# Patient Record
Sex: Female | Born: 1950
Health system: Southern US, Community
[De-identification: ages and names within clinical notes are randomized; demographics above are authoritative.]

## PROBLEM LIST (undated history)

## (undated) DIAGNOSIS — F329 Major depressive disorder, single episode, unspecified: Secondary | ICD-10-CM

## (undated) DIAGNOSIS — E119 Type 2 diabetes mellitus without complications: Secondary | ICD-10-CM

## (undated) DIAGNOSIS — I10 Essential (primary) hypertension: Secondary | ICD-10-CM

## (undated) DIAGNOSIS — J449 Chronic obstructive pulmonary disease, unspecified: Secondary | ICD-10-CM

## (undated) DIAGNOSIS — J45909 Unspecified asthma, uncomplicated: Secondary | ICD-10-CM

## (undated) DIAGNOSIS — M4800 Spinal stenosis, site unspecified: Secondary | ICD-10-CM

## (undated) DIAGNOSIS — E079 Disorder of thyroid, unspecified: Secondary | ICD-10-CM

## (undated) DIAGNOSIS — C801 Malignant (primary) neoplasm, unspecified: Secondary | ICD-10-CM

## (undated) DIAGNOSIS — G4700 Insomnia, unspecified: Secondary | ICD-10-CM

## (undated) DIAGNOSIS — E785 Hyperlipidemia, unspecified: Secondary | ICD-10-CM

## (undated) DIAGNOSIS — F32A Depression, unspecified: Secondary | ICD-10-CM

## (undated) DIAGNOSIS — F1910 Other psychoactive substance abuse, uncomplicated: Secondary | ICD-10-CM

## (undated) HISTORY — DX: Malignant (primary) neoplasm, unspecified: C80.1

## (undated) HISTORY — DX: Chronic obstructive pulmonary disease, unspecified: J44.9

## (undated) HISTORY — DX: Essential (primary) hypertension: I10

## (undated) HISTORY — DX: Disorder of thyroid, unspecified: E07.9

## (undated) HISTORY — DX: Insomnia, unspecified: G47.00

## (undated) HISTORY — PX: CATARACT EXTRACTION: SUR2

## (undated) HISTORY — DX: Depression, unspecified: F32.A

## (undated) HISTORY — DX: Unspecified asthma, uncomplicated: J45.909

## (undated) HISTORY — DX: Type 2 diabetes mellitus without complications: E11.9

## (undated) HISTORY — DX: Spinal stenosis, site unspecified: M48.00

## (undated) HISTORY — DX: Hyperlipidemia, unspecified: E78.5

## (undated) HISTORY — PX: ABDOMINAL HYSTERECTOMY: SHX81

---
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
## 2004-01-01 ENCOUNTER — Ambulatory Visit: Payer: Self-pay | Admitting: Anesthesiology

## 2004-01-28 ENCOUNTER — Ambulatory Visit: Payer: Self-pay | Admitting: Anesthesiology

## 2004-02-25 ENCOUNTER — Ambulatory Visit: Payer: Self-pay | Admitting: Anesthesiology

## 2004-03-17 ENCOUNTER — Ambulatory Visit: Payer: Self-pay | Admitting: Anesthesiology

## 2004-04-20 ENCOUNTER — Ambulatory Visit: Payer: Self-pay | Admitting: Anesthesiology

## 2004-05-19 ENCOUNTER — Ambulatory Visit: Payer: Self-pay | Admitting: Anesthesiology

## 2004-06-10 ENCOUNTER — Ambulatory Visit: Payer: Self-pay | Admitting: Anesthesiology

## 2004-07-13 ENCOUNTER — Ambulatory Visit: Payer: Self-pay | Admitting: Anesthesiology

## 2004-08-13 ENCOUNTER — Ambulatory Visit: Payer: Self-pay | Admitting: Anesthesiology

## 2004-09-14 ENCOUNTER — Ambulatory Visit: Payer: Self-pay | Admitting: Anesthesiology

## 2004-10-08 ENCOUNTER — Ambulatory Visit: Payer: Self-pay | Admitting: Internal Medicine

## 2004-10-13 ENCOUNTER — Ambulatory Visit: Payer: Self-pay | Admitting: Internal Medicine

## 2004-10-27 ENCOUNTER — Ambulatory Visit: Payer: Self-pay | Admitting: Internal Medicine

## 2004-12-01 ENCOUNTER — Ambulatory Visit: Payer: Self-pay | Admitting: Internal Medicine

## 2004-12-06 ENCOUNTER — Ambulatory Visit: Payer: Self-pay | Admitting: Oncology

## 2005-01-04 ENCOUNTER — Ambulatory Visit: Payer: Self-pay | Admitting: Internal Medicine

## 2005-02-04 ENCOUNTER — Ambulatory Visit: Payer: Self-pay | Admitting: Internal Medicine

## 2005-02-04 ENCOUNTER — Ambulatory Visit: Payer: Self-pay | Admitting: Oncology

## 2005-03-25 ENCOUNTER — Ambulatory Visit: Payer: Self-pay | Admitting: Oncology

## 2005-04-01 ENCOUNTER — Ambulatory Visit: Payer: Self-pay

## 2005-07-25 ENCOUNTER — Ambulatory Visit: Payer: Self-pay | Admitting: Oncology

## 2007-08-18 ENCOUNTER — Emergency Department: Payer: Self-pay | Admitting: Orthopaedic Surgery

## 2010-11-10 ENCOUNTER — Emergency Department: Payer: Self-pay | Admitting: *Deleted

## 2012-02-08 ENCOUNTER — Emergency Department: Payer: Self-pay | Admitting: Emergency Medicine

## 2012-02-08 LAB — CK TOTAL AND CKMB (NOT AT ARMC)
CK, Total: 64 U/L (ref 21–215)
CK-MB: 1.5 ng/mL (ref 0.5–3.6)

## 2012-02-08 LAB — CBC
MCH: 32.2 pg (ref 26.0–34.0)
MCHC: 34.3 g/dL (ref 32.0–36.0)
MCV: 94 fL (ref 80–100)
Platelet: 286 10*3/uL (ref 150–440)
RBC: 4.63 10*6/uL (ref 3.80–5.20)
WBC: 11 10*3/uL (ref 3.6–11.0)

## 2012-02-08 LAB — BASIC METABOLIC PANEL
Anion Gap: 9 (ref 7–16)
BUN: 10 mg/dL (ref 7–18)
Chloride: 102 mmol/L (ref 98–107)
Co2: 27 mmol/L (ref 21–32)
Glucose: 111 mg/dL — ABNORMAL HIGH (ref 65–99)
Osmolality: 275 (ref 275–301)

## 2012-02-08 LAB — TROPONIN I: Troponin-I: <0.02 ng/mL

## 2013-09-24 DIAGNOSIS — Z720 Tobacco use: Secondary | ICD-10-CM

## 2013-09-24 DIAGNOSIS — F17219 Nicotine dependence, cigarettes, with unspecified nicotine-induced disorders: Secondary | ICD-10-CM | POA: Insufficient documentation

## 2013-10-07 ENCOUNTER — Ambulatory Visit: Payer: Self-pay | Admitting: Urology

## 2014-03-10 ENCOUNTER — Ambulatory Visit: Payer: Self-pay | Admitting: Nurse Practitioner

## 2014-03-26 ENCOUNTER — Ambulatory Visit: Payer: Self-pay

## 2014-08-10 IMAGING — CR DG CHEST 2V
1 series · 2 of 2 positions shown · non-contrast
Comparison: PA and lateral chest of February 08, 2012

CLINICAL DATA: COPD with wheezing.

EXAM:
CHEST  2 VIEW

[Series 1: pa · 0.17mm/px · 2 of 2 slices shown]
[im 1/2]
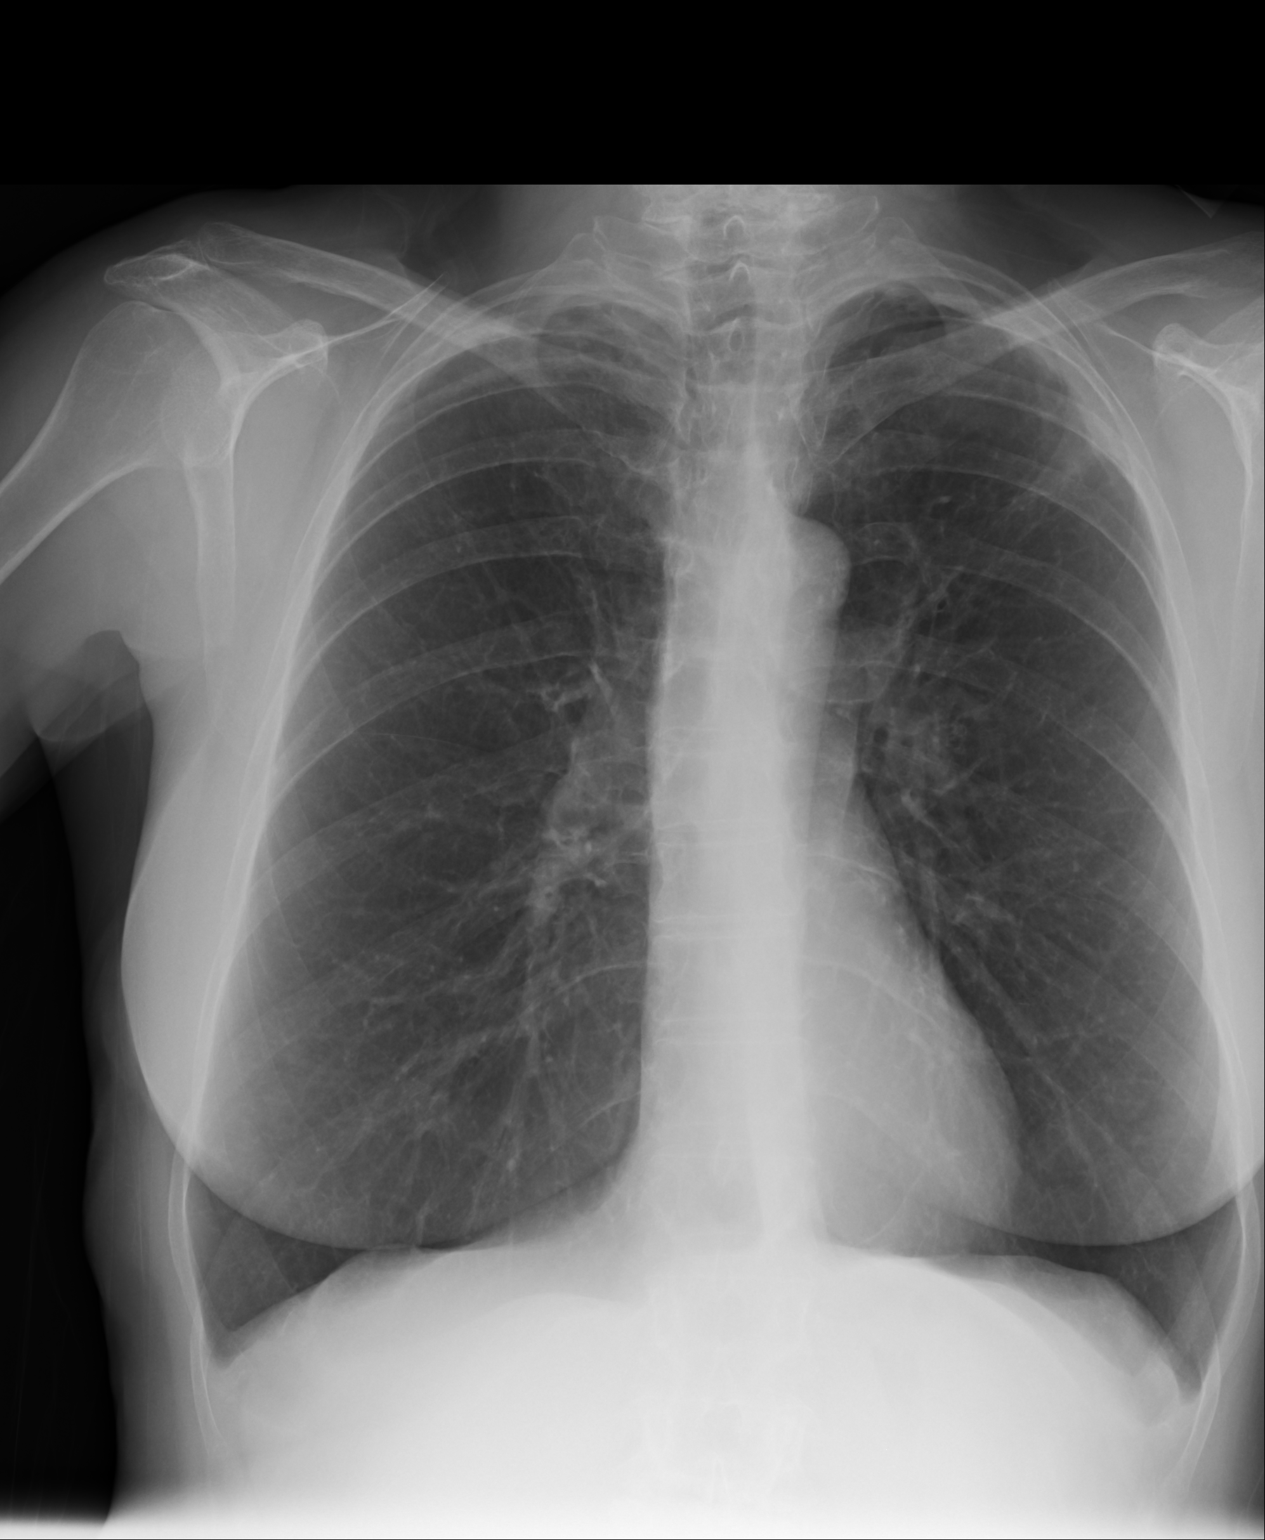
[im 2/2]
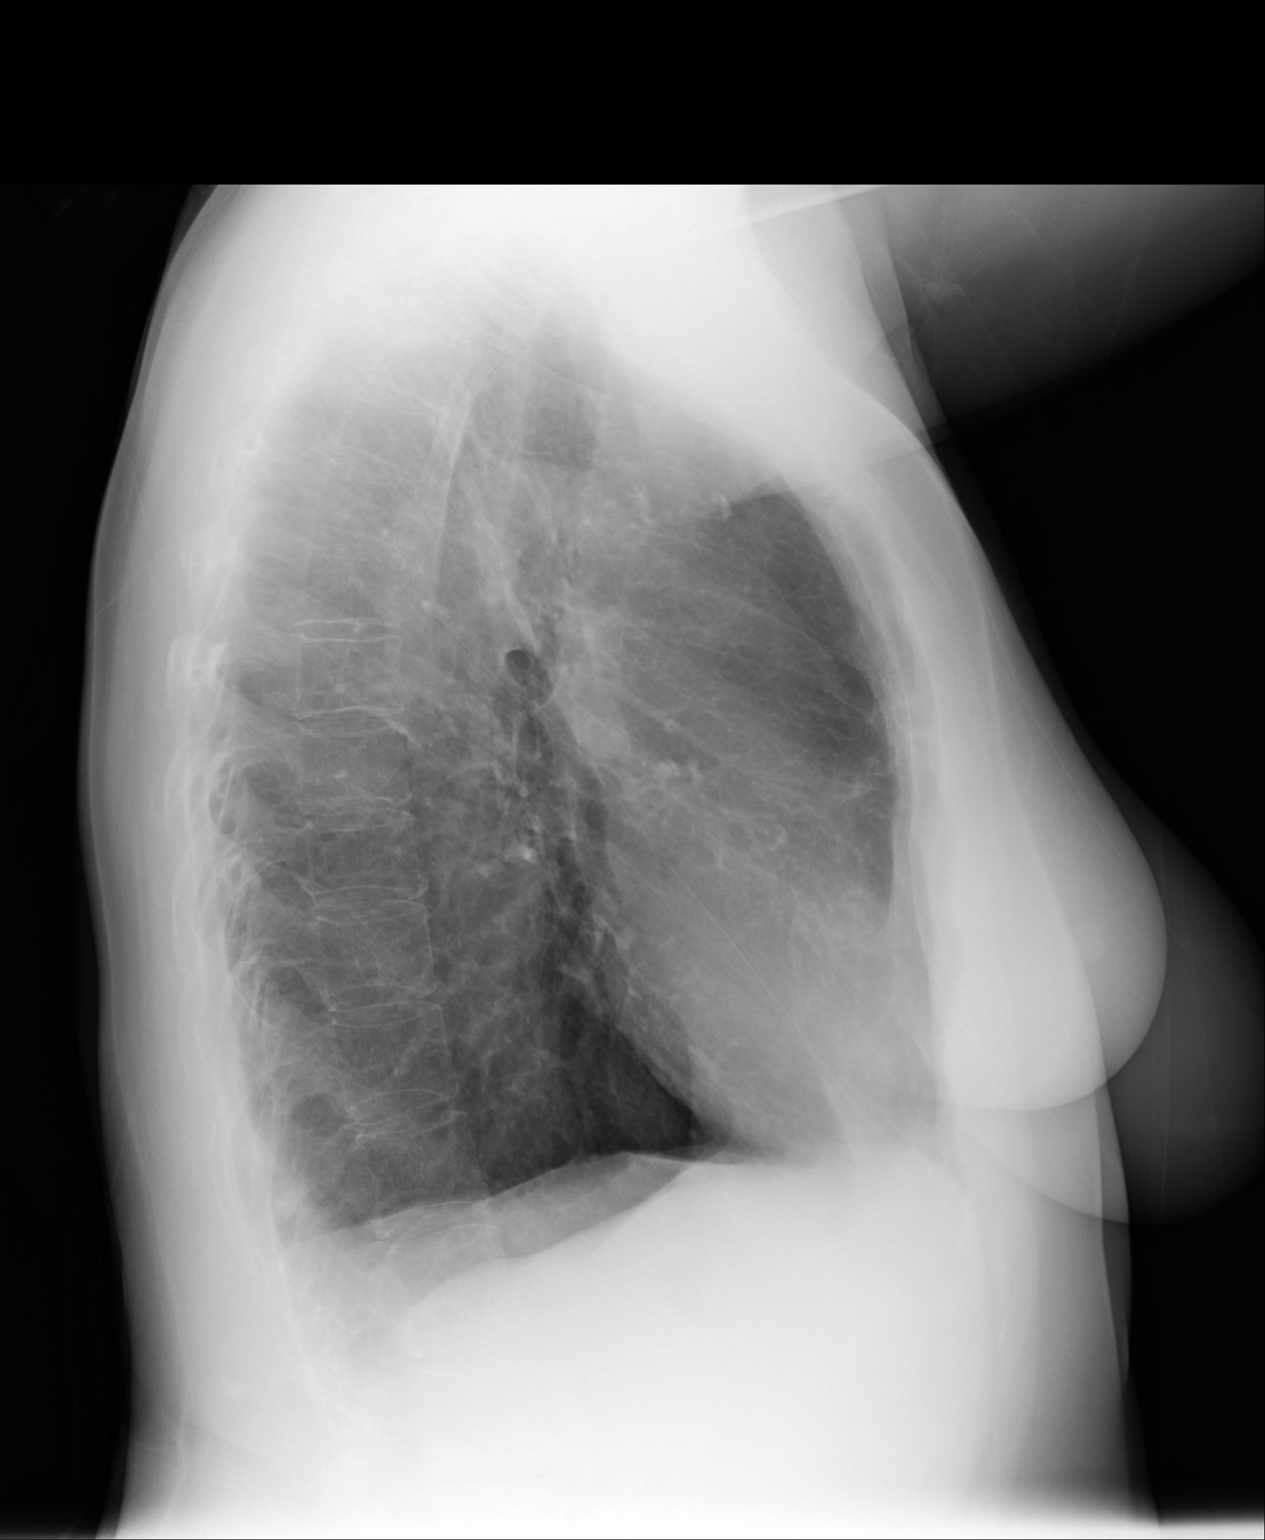

[2 of 2 positions shown; findings below may reference images not displayed]

FINDINGS: The lungs are hyperinflated. There is stable apical pleural
thickening bilaterally greater on the left than on the right. The
heart and pulmonary vascularity are normal. There is no pleural
effusion or pneumothorax. The bony thorax is unremarkable.
IMPRESSION: COPD. There is no pneumonia nor other acute cardiopulmonary
abnormality.

## 2014-08-20 ENCOUNTER — Ambulatory Visit: Payer: Self-pay | Admitting: Internal Medicine

## 2014-08-20 DIAGNOSIS — I10 Essential (primary) hypertension: Secondary | ICD-10-CM | POA: Insufficient documentation

## 2014-11-13 ENCOUNTER — Other Ambulatory Visit: Payer: Self-pay

## 2014-11-18 ENCOUNTER — Other Ambulatory Visit: Payer: Self-pay

## 2014-11-20 ENCOUNTER — Ambulatory Visit: Payer: Self-pay

## 2014-11-20 DIAGNOSIS — M4800 Spinal stenosis, site unspecified: Secondary | ICD-10-CM | POA: Insufficient documentation

## 2014-12-30 ENCOUNTER — Ambulatory Visit: Payer: Self-pay

## 2014-12-30 DIAGNOSIS — G4700 Insomnia, unspecified: Secondary | ICD-10-CM | POA: Insufficient documentation

## 2014-12-30 DIAGNOSIS — F1010 Alcohol abuse, uncomplicated: Secondary | ICD-10-CM | POA: Insufficient documentation

## 2015-02-17 ENCOUNTER — Other Ambulatory Visit: Payer: Self-pay

## 2015-02-17 LAB — BASIC METABOLIC PANEL
BUN: 23 mg/dL — AB (ref 4–21)
CREATININE: 1.2 mg/dL — AB (ref 0.5–1.1)
Glucose: 114 mg/dL
POTASSIUM: 5 mmol/L (ref 3.4–5.3)
Sodium: 135 mmol/L — AB (ref 137–147)

## 2015-02-17 LAB — CBC AND DIFFERENTIAL
HCT: 38 % (ref 36–46)
Hemoglobin: 12.7 g/dL (ref 12.0–16.0)
Neutrophils Absolute: 4 /uL
PLATELETS: 308 10*3/uL (ref 150–399)
WBC: 8.5 10^3/mL

## 2015-02-17 LAB — HEMOGLOBIN A1C: Hemoglobin A1C: 5.8

## 2015-02-26 ENCOUNTER — Ambulatory Visit: Payer: Self-pay

## 2015-03-05 ENCOUNTER — Ambulatory Visit: Payer: Self-pay

## 2015-03-12 ENCOUNTER — Ambulatory Visit: Payer: Self-pay

## 2015-04-02 ENCOUNTER — Other Ambulatory Visit: Payer: Self-pay

## 2015-04-02 LAB — LIPID PANEL
Cholesterol: 165 mg/dL (ref 0–200)
HDL: 79 mg/dL — AB (ref 35–70)
LDL Cholesterol: 50 mg/dL
TRIGLYCERIDES: 182 mg/dL — AB (ref 40–160)

## 2015-04-03 LAB — HEPATIC FUNCTION PANEL
ALK PHOS: 125 U/L (ref 25–125)
ALT: 14 U/L (ref 7–35)
AST: 26 U/L (ref 13–35)
BILIRUBIN DIRECT: 0.12 mg/dL (ref 0.01–0.4)
BILIRUBIN, TOTAL: 0.3 mg/dL

## 2015-04-09 ENCOUNTER — Ambulatory Visit: Payer: Self-pay

## 2015-04-09 DIAGNOSIS — E785 Hyperlipidemia, unspecified: Secondary | ICD-10-CM | POA: Insufficient documentation

## 2015-04-09 DIAGNOSIS — J440 Chronic obstructive pulmonary disease with acute lower respiratory infection: Secondary | ICD-10-CM

## 2015-04-09 DIAGNOSIS — J209 Acute bronchitis, unspecified: Secondary | ICD-10-CM | POA: Insufficient documentation

## 2015-06-26 ENCOUNTER — Other Ambulatory Visit: Payer: Self-pay | Admitting: Nurse Practitioner

## 2015-06-26 ENCOUNTER — Other Ambulatory Visit: Payer: Self-pay | Admitting: Urology

## 2015-07-02 DIAGNOSIS — F1010 Alcohol abuse, uncomplicated: Secondary | ICD-10-CM

## 2015-07-02 DIAGNOSIS — J449 Chronic obstructive pulmonary disease, unspecified: Secondary | ICD-10-CM

## 2015-07-02 DIAGNOSIS — J45909 Unspecified asthma, uncomplicated: Secondary | ICD-10-CM

## 2015-07-02 DIAGNOSIS — E785 Hyperlipidemia, unspecified: Secondary | ICD-10-CM

## 2015-07-02 DIAGNOSIS — I10 Essential (primary) hypertension: Secondary | ICD-10-CM

## 2015-07-02 DIAGNOSIS — M4800 Spinal stenosis, site unspecified: Secondary | ICD-10-CM

## 2015-07-02 DIAGNOSIS — G4700 Insomnia, unspecified: Secondary | ICD-10-CM

## 2015-07-02 DIAGNOSIS — Z720 Tobacco use: Secondary | ICD-10-CM

## 2015-07-29 ENCOUNTER — Other Ambulatory Visit: Payer: Self-pay | Admitting: Urology

## 2015-08-04 ENCOUNTER — Ambulatory Visit: Payer: Self-pay | Admitting: Family Medicine

## 2015-08-04 VITALS — BP 121/76 | HR 114 | Wt 148.0 lb

## 2015-08-04 DIAGNOSIS — J449 Chronic obstructive pulmonary disease, unspecified: Secondary | ICD-10-CM

## 2015-08-04 DIAGNOSIS — M4800 Spinal stenosis, site unspecified: Secondary | ICD-10-CM

## 2015-08-04 DIAGNOSIS — J40 Bronchitis, not specified as acute or chronic: Secondary | ICD-10-CM

## 2015-08-04 MED ORDER — GABAPENTIN 300 MG PO CAPS: ORAL_CAPSULE | ORAL | Status: DC

## 2015-08-04 MED ORDER — GUAIFENESIN 100 MG/5ML PO SYRP: 100.0000 mg | ORAL_SOLUTION | Freq: Three times a day (TID) | ORAL | Status: DC | PRN

## 2015-08-04 MED ORDER — AZITHROMYCIN 250 MG PO TABS: ORAL_TABLET | ORAL | Status: AC

## 2015-08-04 NOTE — Progress Notes (Signed)
Name: Kristen Mills   MRN: [id number]    DOB: 1950-12-04   Date:08/04/2015       Progress Note  Subjective  Chief Complaint  Chief Complaint  Patient presents with  . Cough  . COPD  . Medication Refill    gabapentin refill    HPI  Complain of coughj, head congestion, chest tightness for the last week. Cough is non-productive. Feels like she is in a bucket. Having left ear pain.    Also has spinal stenosis and takes gabapentin three times a day which helps.   Lab Results  Component Value Date   CREATININE 1.2* 02/17/2015   BUN 23* 02/17/2015   NA 135* 02/17/2015   K 5.0 02/17/2015   CL 102 02/08/2012   CO2 27 02/08/2012   Lab Results  Component Value Date   CHOL 165 04/02/2015   HDL 79* 04/02/2015   LDLCALC 50 04/02/2015   TRIG 182* 04/02/2015   No problem-specific assessment & plan notes found for this encounter.   Past Medical History  Diagnosis Date  . Asthma   . COPD (chronic obstructive pulmonary disease) ([place])   . Hypertension   . Cancer [facility])     Uterine Cancer    Social History  Substance Use Topics  . Smoking status: Current Every Day Smoker -- 1.00 packs/day    Types: Cigarettes  . Smokeless tobacco: Not on file  . Alcohol Use: 4.2 oz/week    7 Cans of beer per week     Comment: Few beers daily.     Current outpatient prescriptions:  .  albuterol (VENTOLIN HFA) 108 (90 Base) MCG/ACT inhaler, Inhale 2 puffs into the lungs every 6 (six) hours as needed for wheezing or shortness of breath., Disp: , Rfl:  .  cetirizine (ZYRTEC) 10 MG tablet, Take 10 mg by mouth daily., Disp: , Rfl:  .  fluticasone (VERAMYST) 27.5 MCG/SPRAY nasal spray, Place 2 sprays into the nose daily., Disp: , Rfl:  .  Fluticasone-Salmeterol (ADVAIR DISKUS) 250-50 MCG/DOSE AEPB, Inhale 2 puffs into the lungs every 6 (six) hours as needed., Disp: , Rfl:  .  gabapentin (NEURONTIN) 300 MG capsule, TAKE 3 CAPSULES BY MOUTH 3 TIMES A DAY, Disp: 270 capsule, Rfl: 0 .  gemfibrozil  (LOPID) 600 MG tablet, Take 600 mg by mouth 2 (two) times daily before a meal., Disp: , Rfl:  .  lisinopril (PRINIVIL,ZESTRIL) 10 MG tablet, TAKE ONE TABLET BY MOUTH EVERY DAY., Disp: 90 tablet, Rfl: 0 .  nortriptyline (PAMELOR) 25 MG capsule, Take 25 mg by mouth at bedtime., Disp: , Rfl:  .  lisinopril (PRINIVIL,ZESTRIL) 10 MG tablet, TAKE ONE TABLET BY MOUTH EVERY DAY., Disp: 30 tablet, Rfl: 0  Allergies no known allergies  ROS  See HPI  Objective  Filed Vitals:   08/04/15 1745  BP: 121/76  Pulse: 114  Weight: 148 lb (67.132 kg)     Physical Exam  General Appearance:    Alert, cooperative, no distress  HENT:   bilateral TM normal without fluid or infection, neck without nodes, throat normal without erythema or exudate and frontal  sinus tender  Eyes:    PERRL, conjunctiva/corneas clear, EOM's intact       Lungs:     Occasional expiratory wheeze, no rales,  respirations unlabored  Heart:    Regular rate and rhythm  Neurologic:   Awake, alert, oriented x 3. No apparent focal neurological           defect.  No results found for this or any previous visit (from the past 2160 hour(s)).   Assessment & Plan  1. Chronic obstructive pulmonary disease, unspecified COPD type ([place]) Continue current inhalers  2. Spinal stenosis, unspecified spinal region Refill gabapentin - gabapentin (NEURONTIN) 300 MG capsule; TAKE 3 CAPSULES BY MOUTH 3 TIMES A DAY. Please place prescription on hold until patient calls for refill  Dispense: 270 capsule; Refill: 3  3. Bronchitis  - azithromycin (ZITHROMAX) 250 MG tablet; 2 by mouth today, then 1 daily for 4 days  Dispense: 6 tablet; Refill: 0 - guaifenesin (ROBITUSSIN) 100 MG/5ML syrup; Take 5 mLs (100 mg total) by mouth 3 (three) times daily as needed for cough.  Dispense: 120 mL; Refill: 0

## 2015-08-11 ENCOUNTER — Telehealth: Payer: Self-pay

## 2015-08-11 NOTE — Telephone Encounter (Signed)
Patient states that she seen Dr. Caryn Section last Tuesday evening and he gave her an RX for a ZPak for sinusitis. Patient does not feel the infection is resolved and would like another round of the antibiotic.

## 2015-08-11 NOTE — Telephone Encounter (Signed)
Patient will need Augmentin 875/125 mg, 1 tablet twice daily, #14.

## 2015-10-08 ENCOUNTER — Ambulatory Visit: Payer: Self-pay

## 2015-10-20 ENCOUNTER — Ambulatory Visit: Payer: Self-pay | Admitting: Urology

## 2015-10-20 DIAGNOSIS — M4800 Spinal stenosis, site unspecified: Secondary | ICD-10-CM

## 2015-10-20 MED ORDER — LISINOPRIL 10 MG PO TABS: 10.0000 mg | ORAL_TABLET | Freq: Every day | ORAL | 0 refills | Status: DC

## 2015-10-20 MED ORDER — GABAPENTIN 300 MG PO CAPS: ORAL_CAPSULE | ORAL | 3 refills | Status: DC

## 2015-10-20 MED ORDER — GEMFIBROZIL 600 MG PO TABS: 600.0000 mg | ORAL_TABLET | Freq: Two times a day (BID) | ORAL | 0 refills | Status: DC

## 2015-10-20 MED ORDER — FLUTICASONE FUROATE 27.5 MCG/SPRAY NA SUSP: 2.0000 | Freq: Every day | NASAL | 3 refills | Status: DC

## 2015-10-20 NOTE — Progress Notes (Signed)
  Patient: Kristen Mills Female    DOB: February 19, 1951   65 y.o.   MRN: [id number] Visit Date: 10/20/2015  Today's Provider: [place]   Chief Complaint  Patient presents with  . Diabetes    medication refill   Subjective:    HPI Patient with COPD, HLD and HTN presents today for a 6 month follow up.    She has been told she tested positive for Hep C [facility].    Need meds refilled.  Using rescue inhaler three times daily      Allergies no known allergies Previous Medications   ALBUTEROL (VENTOLIN HFA) 108 (90 BASE) MCG/ACT INHALER    Inhale 2 puffs into the lungs every 6 (six) hours as needed for wheezing or shortness of breath.   CETIRIZINE (ZYRTEC) 10 MG TABLET    Take 10 mg by mouth daily.   FLUTICASONE-SALMETEROL (ADVAIR DISKUS) 250-50 MCG/DOSE AEPB    Inhale 2 puffs into the lungs every 6 (six) hours as needed.   GUAIFENESIN (ROBITUSSIN) 100 MG/5ML SYRUP    Take 5 mLs (100 mg total) by mouth 3 (three) times daily as needed for cough.   NORTRIPTYLINE (PAMELOR) 25 MG CAPSULE    Take 25 mg by mouth at bedtime.    Review of Systems  Social History  Substance Use Topics  . Smoking status: Current Every Day Smoker    Packs/day: 1.00    Types: Cigarettes  . Smokeless tobacco: Not on file  . Alcohol use 4.2 oz/week    7 Cans of beer per week     Comment: Few beers daily.   Objective:   BP 105/77   Pulse (!) 107   Wt (P) 149 lb (67.6 kg)   Physical Exam  Heart:  S1, S2, no murmurs Lung: Expiratory wheezes    Assessment & Plan:     1. COPD  -Veramyst refilled  -Using rescue inhaler three times daily  -Using Advair  -Encourage smoking cessation  2. Tobacco addiction  -Not interested in quitting at this time  3. Alcohol abuse  -Encourage cessation of ETOH  4. Hep C  -recheck test today  -RTC pending labs      [facility]

## 2015-10-21 LAB — CBC WITH DIFFERENTIAL/PLATELET
Basophils Absolute: 0.1 10*3/uL (ref 0.0–0.2)
Basos: 1 %
EOS (ABSOLUTE): 0.1 10*3/uL (ref 0.0–0.4)
Eos: 1 %
HEMOGLOBIN: 13.7 g/dL (ref 11.1–15.9)
Hematocrit: 42.5 % (ref 34.0–46.6)
IMMATURE GRANS (ABS): 0 10*3/uL (ref 0.0–0.1)
Immature Granulocytes: 0 %
LYMPHS ABS: 2.7 10*3/uL (ref 0.7–3.1)
LYMPHS: 30 %
MCH: 29.3 pg (ref 26.6–33.0)
MCHC: 32.2 g/dL (ref 31.5–35.7)
MCV: 91 fL (ref 79–97)
MONOCYTES: 10 %
Monocytes Absolute: 0.9 10*3/uL (ref 0.1–0.9)
Neutrophils Absolute: 5.2 10*3/uL (ref 1.4–7.0)
Neutrophils: 58 %
Platelets: 360 10*3/uL (ref 150–379)
RBC: 4.67 x10E6/uL (ref 3.77–5.28)
RDW: 13.8 % (ref 12.3–15.4)
WBC: 9 10*3/uL (ref 3.4–10.8)

## 2015-10-21 LAB — COMPREHENSIVE METABOLIC PANEL
A/G RATIO: 1.4 (ref 1.2–2.2)
ALBUMIN: 4.2 g/dL (ref 3.6–4.8)
ALK PHOS: 77 IU/L (ref 39–117)
ALT: 27 IU/L (ref 0–32)
AST: 31 IU/L (ref 0–40)
BUN/Creatinine Ratio: 19 (ref 12–28)
BUN: 21 mg/dL (ref 8–27)
Bilirubin Total: 0.2 mg/dL (ref 0.0–1.2)
CO2: 23 mmol/L (ref 18–29)
CREATININE: 1.12 mg/dL — AB (ref 0.57–1.00)
Calcium: 9.8 mg/dL (ref 8.7–10.3)
Chloride: 98 mmol/L (ref 96–106)
GFR calc Af Amer: 60 mL/min/{1.73_m2} (ref >59)
GFR, EST NON AFRICAN AMERICAN: 52 mL/min/{1.73_m2} — AB (ref >59)
Globulin, Total: 2.9 g/dL (ref 1.5–4.5)
Glucose: 123 mg/dL — ABNORMAL HIGH (ref 65–99)
Potassium: 4.9 mmol/L (ref 3.5–5.2)
Sodium: 137 mmol/L (ref 134–144)
TOTAL PROTEIN: 7.1 g/dL (ref 6.0–8.5)

## 2015-10-21 LAB — LIPID PANEL
CHOL/HDL RATIO: 2.6 ratio (ref 0.0–4.4)
CHOLESTEROL TOTAL: 187 mg/dL (ref 100–199)
HDL: 72 mg/dL (ref >39)
LDL CALC: 85 mg/dL (ref 0–99)
TRIGLYCERIDES: 149 mg/dL (ref 0–149)
VLDL CHOLESTEROL CAL: 30 mg/dL (ref 5–40)

## 2015-10-21 LAB — TSH: TSH: 1.72 u[IU]/mL (ref 0.450–4.500)

## 2015-10-23 LAB — SPECIMEN STATUS REPORT

## 2015-10-27 LAB — HEPATITIS C ANTIBODY (REFLEX)

## 2015-10-27 LAB — COMMENT2 - HEP PANEL

## 2015-10-27 LAB — SPECIMEN STATUS REPORT

## 2015-11-03 ENCOUNTER — Ambulatory Visit: Payer: Self-pay | Admitting: Family Medicine

## 2015-11-03 DIAGNOSIS — B1920 Unspecified viral hepatitis C without hepatic coma: Secondary | ICD-10-CM

## 2015-11-03 DIAGNOSIS — B182 Chronic viral hepatitis C: Secondary | ICD-10-CM | POA: Insufficient documentation

## 2015-11-03 NOTE — Progress Notes (Signed)
     Primary Care Progress Note  Patient: Kristen Mills Female    DOB: [birth year]   65 y.o.   MRN: [id number] Visit Date: 11/03/2015  Today's Provider: Lelon Huh, MD  No chief complaint on file.  Subjective:    HPI She is here to follow up on labs with positive hepatitis C antibody test. She states she contracted it from old boyfriend. She feels fatigued, but otherwise well.     No Known Allergies Previous Medications   ALBUTEROL (VENTOLIN HFA) 108 (90 BASE) MCG/ACT INHALER    Inhale 2 puffs into the lungs every 6 (six) hours as needed for wheezing or shortness of breath.   CETIRIZINE (ZYRTEC) 10 MG TABLET    Take 10 mg by mouth daily.   FLUTICASONE (VERAMYST) 27.5 MCG/SPRAY NASAL SPRAY    Place 2 sprays into the nose daily.   FLUTICASONE-SALMETEROL (ADVAIR DISKUS) 250-50 MCG/DOSE AEPB    Inhale 2 puffs into the lungs every 6 (six) hours as needed.   GABAPENTIN (NEURONTIN) 300 MG CAPSULE    TAKE 3 CAPSULES BY MOUTH 3 TIMES A DAY. Please place prescription on hold until patient calls for refill   GEMFIBROZIL (LOPID) 600 MG TABLET    Take 1 tablet (600 mg total) by mouth 2 (two) times daily before a meal.   GUAIFENESIN (ROBITUSSIN) 100 MG/5ML SYRUP    Take 5 mLs (100 mg total) by mouth 3 (three) times daily as needed for cough.   LISINOPRIL (PRINIVIL,ZESTRIL) 10 MG TABLET    Take 1 tablet (10 mg total) by mouth daily.   NORTRIPTYLINE (PAMELOR) 25 MG CAPSULE    Take 25 mg by mouth at bedtime.    Review of Systems  Social History  Substance Use Topics  . Smoking status: Current Every Day Smoker    Packs/day: 1.00    Years: 45.00    Types: Cigarettes  . Smokeless tobacco: Never Used  . Alcohol use 4.2 oz/week    7 Cans of beer per week     Comment: Few beers daily.   Objective:   BP 117/77   Pulse 93   Wt 148 lb (67.1 kg)   Physical Exam   General Appearance:    Alert, cooperative, no distress  Eyes:    PERRL, conjunctiva/corneas clear, EOM's intact       Lungs:      Clear to auscultation bilaterally, respirations unlabored  Heart:    Regular rate and rhythm  Neurologic:   Awake, alert, oriented x 3. No apparent focal neurological           defect.           Assessment & Plan:     1. Chronic hepatitis C without hepatic coma ([place]) She would like to pursue treatment if RNA titers are positive. Anticipate referral to [facility] if so.  - HCV RNA quant       Lelon Huh, MD

## 2015-11-05 LAB — HCV RNA QUANT: Hepatitis C Quantitation: <15 IU/mL

## 2015-11-12 ENCOUNTER — Other Ambulatory Visit: Payer: Self-pay | Admitting: Urology

## 2015-11-12 MED ORDER — FENOFIBRATE 145 MG PO TABS: 145.0000 mg | ORAL_TABLET | Freq: Every day | ORAL | 0 refills | Status: DC

## 2015-11-12 MED ORDER — FLUTICASONE PROPIONATE 50 MCG/ACT NA SUSP: 2.0000 | Freq: Every day | NASAL | 2 refills | Status: DC

## 2015-11-17 ENCOUNTER — Ambulatory Visit: Payer: Self-pay | Admitting: Urology

## 2015-11-17 VITALS — BP 96/60 | HR 111 | Wt 151.0 lb

## 2015-11-17 DIAGNOSIS — E119 Type 2 diabetes mellitus without complications: Secondary | ICD-10-CM

## 2015-11-17 DIAGNOSIS — K759 Inflammatory liver disease, unspecified: Secondary | ICD-10-CM

## 2015-11-17 LAB — POCT CBG (FASTING - GLUCOSE)-MANUAL ENTRY: GLUCOSE FASTING, POC: 134 mg/dL — AB (ref 70–99)

## 2015-11-17 NOTE — Progress Notes (Signed)
     Primary Care Progress Note  Patient: Kristen Mills Female    DOB: Nov 21, 1950   65 y.o.   MRN: [id number] Visit Date: 11/17/2015  Today's Provider: Lelon Huh, MD  Chief Complaint  Patient presents with  . Follow-up    HCV lab follow up    Subjective:    HPI She is here to follow up on labs with positive hepatitis C antibody test. She states she contracted it from old boyfriend. She feels fatigued, but otherwise well.     No Known Allergies Previous Medications   ALBUTEROL (VENTOLIN HFA) 108 (90 BASE) MCG/ACT INHALER    Inhale 2 puffs into the lungs every 6 (six) hours as needed for wheezing or shortness of breath.   CETIRIZINE (ZYRTEC) 10 MG TABLET    Take 10 mg by mouth daily.   FENOFIBRATE (TRICOR) 145 MG TABLET    Take 1 tablet (145 mg total) by mouth daily.   FLUTICASONE (FLONASE) 50 MCG/ACT NASAL SPRAY    Place 2 sprays into both nostrils daily.   FLUTICASONE-SALMETEROL (ADVAIR DISKUS) 250-50 MCG/DOSE AEPB    Inhale 2 puffs into the lungs every 6 (six) hours as needed.   GABAPENTIN (NEURONTIN) 300 MG CAPSULE    TAKE 3 CAPSULES BY MOUTH 3 TIMES A DAY. Please place prescription on hold until patient calls for refill   GEMFIBROZIL (LOPID) 600 MG TABLET    Take 1 tablet (600 mg total) by mouth 2 (two) times daily before a meal.   GUAIFENESIN (ROBITUSSIN) 100 MG/5ML SYRUP    Take 5 mLs (100 mg total) by mouth 3 (three) times daily as needed for cough.   LISINOPRIL (PRINIVIL,ZESTRIL) 10 MG TABLET    Take 1 tablet (10 mg total) by mouth daily.   NORTRIPTYLINE (PAMELOR) 25 MG CAPSULE    Take 25 mg by mouth at bedtime.    Review of Systems  Social History  Substance Use Topics  . Smoking status: Current Every Day Smoker    Packs/day: 1.00    Years: 45.00    Types: Cigarettes  . Smokeless tobacco: Never Used  . Alcohol use 4.2 oz/week    7 Cans of beer per week     Comment: Few beers daily.   Objective:   BP 96/60   Pulse (!) 111   Wt 151 lb (68.5 kg)   SpO2 96%     Physical Exam   General Appearance:    Alert, cooperative, no distress  Eyes:    PERRL, conjunctiva/corneas clear, EOM's intact       Lungs:     Clear to auscultation bilaterally, respirations unlabored  Heart:    Regular rate and rhythm  Neurologic:   Awake, alert, oriented x 3. No apparent focal neurological           defect.           Assessment & Plan:     1. Chronic hepatitis C without hepatic coma ([place]) She would like to pursue treatment if RNA titers are positive. Anticipate referral to [facility] if so.  - HCV RNA quant < 15 - will need to retest in 2 months -advised patient that she will need to be without alcohol for 6 months before [facility] liver clinic will accept as a patient       Lelon Huh, MD

## 2016-01-19 ENCOUNTER — Other Ambulatory Visit: Payer: Self-pay

## 2016-01-19 DIAGNOSIS — K759 Inflammatory liver disease, unspecified: Secondary | ICD-10-CM

## 2016-01-21 LAB — HCV RNA QUANT
HCV log10: 1.954 log10 IU/mL
HEPATITIS C QUANTITATION: 90 [IU]/mL

## 2016-03-18 ENCOUNTER — Telehealth: Payer: Self-pay | Admitting: Pharmacist

## 2016-03-18 NOTE — Telephone Encounter (Signed)
Ventolin & Advair PAP submitted to manufacturer today.

## 2016-03-30 ENCOUNTER — Other Ambulatory Visit: Payer: Self-pay | Admitting: Urology

## 2016-04-19 ENCOUNTER — Ambulatory Visit: Payer: Self-pay | Admitting: Nurse Practitioner

## 2016-04-19 ENCOUNTER — Other Ambulatory Visit: Payer: Self-pay

## 2016-04-19 VITALS — BP 82/50 | HR 111 | Temp 100.1°F | Wt 151.6 lb

## 2016-04-19 DIAGNOSIS — J40 Bronchitis, not specified as acute or chronic: Secondary | ICD-10-CM

## 2016-04-19 MED ORDER — PREDNISONE 5 MG PO TABS: ORAL_TABLET | ORAL | 0 refills | Status: DC

## 2016-04-19 MED ORDER — AZITHROMYCIN 500 MG PO TABS: 500.0000 mg | ORAL_TABLET | Freq: Every day | ORAL | Status: AC

## 2016-04-19 NOTE — Progress Notes (Signed)
  Here today for acute illness, cough and congestion, positive for nasal congestion.    Ventolin using 3-4 times per day, continues to use advair  Has advair, Continues to smoke 1ppd  Drinks one 24 ounce beer per day  Exam:  Alert, in NAD BBrS markedly, positive for wheezing with coarseness with coughing  AP RRR  BP at 80/50  Plan:    Discussed smoking cessation, and have instructed she must decrease her alcohol consumption by 1/2 or quit altogether  Not diabetic have encouraged to drink gator ade to help her bp/  Likely has bronchitis, will treat with 6, 500mg  tabs of azithromycin for 6 days.    Will do prednisone taper  Would like to reassess in 1-2 weeks to assess response to med     ` ``

## 2016-05-03 ENCOUNTER — Ambulatory Visit: Payer: Self-pay | Admitting: Adult Health Nurse Practitioner

## 2016-05-03 VITALS — BP 128/85 | HR 124 | Temp 99.1°F | Wt 152.2 lb

## 2016-05-03 DIAGNOSIS — E782 Mixed hyperlipidemia: Secondary | ICD-10-CM

## 2016-05-03 DIAGNOSIS — J440 Chronic obstructive pulmonary disease with acute lower respiratory infection: Secondary | ICD-10-CM

## 2016-05-03 DIAGNOSIS — I10 Essential (primary) hypertension: Secondary | ICD-10-CM

## 2016-05-03 DIAGNOSIS — J209 Acute bronchitis, unspecified: Secondary | ICD-10-CM

## 2016-05-03 MED ORDER — GEMFIBROZIL 600 MG PO TABS: 600.0000 mg | ORAL_TABLET | Freq: Two times a day (BID) | ORAL | 0 refills | Status: DC

## 2016-05-03 MED ORDER — LISINOPRIL 5 MG PO TABS: 5.0000 mg | ORAL_TABLET | Freq: Every day | ORAL | 3 refills | Status: DC

## 2016-05-03 NOTE — Progress Notes (Signed)
Patient: Kristen Mills Female    DOB: Nov 07, 1950   66 y.o.   MRN: [id number] Visit Date: 05/03/2016  Today's Provider: [place]   Chief Complaint  Patient presents with  . Anxiety    pt is extremely emotional today  . Follow-up  . Allergies   Subjective:    HPI     Pt states that her symptoms are improved- finished prednisone and antibiotics.  Pt states that she is currently upset due to friend cussing her out right prior to the visit and relates that to increased HR.  HR always 110s.    Pt states the she is out of BP medications and lopid.  Last BP was 80/50. -improved today.   No Known Allergies Previous Medications   ALBUTEROL (VENTOLIN HFA) 108 (90 BASE) MCG/ACT INHALER    Inhale 2 puffs into the lungs every 6 (six) hours as needed for wheezing or shortness of breath.   CETIRIZINE (ZYRTEC) 10 MG TABLET    Take 10 mg by mouth daily.   FENOFIBRATE (TRICOR) 145 MG TABLET    Take 1 tablet (145 mg total) by mouth daily.   FLUTICASONE (FLONASE) 50 MCG/ACT NASAL SPRAY    Place 2 sprays into both nostrils daily.   FLUTICASONE-SALMETEROL (ADVAIR DISKUS) 250-50 MCG/DOSE AEPB    Inhale 2 puffs into the lungs every 6 (six) hours as needed.   GABAPENTIN (NEURONTIN) 300 MG CAPSULE    TAKE 3 CAPSULES BY MOUTH 3 TIMES A DAY. Please place prescription on hold until patient calls for refill   GEMFIBROZIL (LOPID) 600 MG TABLET    Take 1 tablet (600 mg total) by mouth 2 (two) times daily before a meal.   GUAIFENESIN (ROBITUSSIN) 100 MG/5ML SYRUP    Take 5 mLs (100 mg total) by mouth 3 (three) times daily as needed for cough.   LISINOPRIL (PRINIVIL,ZESTRIL) 10 MG TABLET    Take 1 tablet (10 mg total) by mouth daily.   NORTRIPTYLINE (PAMELOR) 25 MG CAPSULE    TAKE ONE TABLET BY MOUTH EVERY DAY   PREDNISONE (DELTASONE) 5 MG TABLET    By mouth, begin with 8 tablets, each day decrease by one tab, 8-7-6-5-4-3-2-1    Review of Systems  Social History  Substance Use Topics  .  Smoking status: Current Every Day Smoker    Packs/day: 1.00    Years: 45.00    Types: Cigarettes  . Smokeless tobacco: Never Used  . Alcohol use 4.2 oz/week    7 Cans of beer per week     Comment: Few beers daily.   Objective:   BP 128/85   Pulse (!) 124   Temp 99.1 F (37.3 C)   Wt 152 lb 3.2 oz (69 kg)   Physical Exam  Constitutional: She is oriented to person, place, and time. She appears well-developed and well-nourished.  HENT:  Head: Normocephalic and atraumatic.  Neck: Neck supple.  Cardiovascular: Regular rhythm and normal heart sounds.   Pulmonary/Chest: Effort normal and breath sounds normal.  Abdominal: Bowel sounds are normal.  Lymphadenopathy:    She has no cervical adenopathy.  Neurological: She is alert and oriented to person, place, and time.  Skin: Skin is warm and dry.  Psychiatric: She has a normal mood and affect.  Vitals reviewed.       Assessment & Plan:           HTN:  Decrease lisinopril to 5mg  daily.  Low sodium diet  FU in 4 weeks  for BP check. Check Pulse if still tachy consider EKG.   HLD:  Check lipids.  Refill gemfibrozil.    Bronchitis:  Will check CBC due to low grade fever.  Increase fluids.  Supportive care.    [facility]

## 2016-05-04 LAB — COMPREHENSIVE METABOLIC PANEL
ALK PHOS: 71 IU/L (ref 39–117)
ALT: 201 IU/L — AB (ref 0–32)
AST: 142 IU/L — ABNORMAL HIGH (ref 0–40)
Albumin/Globulin Ratio: 1.4 (ref 1.2–2.2)
Albumin: 4.3 g/dL (ref 3.6–4.8)
BUN/Creatinine Ratio: 13 (ref 12–28)
BUN: 20 mg/dL (ref 8–27)
Bilirubin Total: 0.3 mg/dL (ref 0.0–1.2)
CALCIUM: 10.1 mg/dL (ref 8.7–10.3)
CO2: 20 mmol/L (ref 18–29)
CREATININE: 1.53 mg/dL — AB (ref 0.57–1.00)
Chloride: 97 mmol/L (ref 96–106)
GFR calc Af Amer: 41 mL/min/{1.73_m2} — ABNORMAL LOW (ref >59)
GFR calc non Af Amer: 36 mL/min/{1.73_m2} — ABNORMAL LOW (ref >59)
GLUCOSE: 140 mg/dL — AB (ref 65–99)
Globulin, Total: 3.1 g/dL (ref 1.5–4.5)
Potassium: 4.9 mmol/L (ref 3.5–5.2)
Sodium: 136 mmol/L (ref 134–144)
Total Protein: 7.4 g/dL (ref 6.0–8.5)

## 2016-05-04 LAB — CBC
HEMATOCRIT: 41 % (ref 34.0–46.6)
Hemoglobin: 13.7 g/dL (ref 11.1–15.9)
MCH: 30.2 pg (ref 26.6–33.0)
MCHC: 33.4 g/dL (ref 31.5–35.7)
MCV: 91 fL (ref 79–97)
PLATELETS: 317 10*3/uL (ref 150–379)
RBC: 4.53 x10E6/uL (ref 3.77–5.28)
RDW: 13.5 % (ref 12.3–15.4)
WBC: 9.2 10*3/uL (ref 3.4–10.8)

## 2016-05-05 ENCOUNTER — Other Ambulatory Visit: Payer: Self-pay | Admitting: Adult Health Nurse Practitioner

## 2016-05-05 DIAGNOSIS — R7989 Other specified abnormal findings of blood chemistry: Secondary | ICD-10-CM

## 2016-05-05 DIAGNOSIS — R945 Abnormal results of liver function studies: Principal | ICD-10-CM

## 2016-05-06 ENCOUNTER — Telehealth: Payer: Self-pay

## 2016-05-06 NOTE — Telephone Encounter (Signed)
Called pt with results. Appt made for labs. Pt verbalized understanding.

## 2016-05-06 NOTE — Telephone Encounter (Signed)
-----   Message from Staci Acosta, NP sent at 05/05/2016  5:38 PM EST ----- Liver enzymes up- avoid alcohol and tylenol intake. Kidney function has also worsened, increase water intake.  Repeat lab work when back for BP check on 3.6.18.

## 2016-05-31 ENCOUNTER — Other Ambulatory Visit: Payer: Self-pay

## 2016-05-31 ENCOUNTER — Ambulatory Visit: Payer: Self-pay | Admitting: Urology

## 2016-05-31 ENCOUNTER — Other Ambulatory Visit: Payer: Self-pay | Admitting: Urology

## 2016-05-31 NOTE — Progress Notes (Unsigned)
No complaints

## 2016-06-08 ENCOUNTER — Other Ambulatory Visit: Payer: Self-pay

## 2016-06-08 DIAGNOSIS — R945 Abnormal results of liver function studies: Principal | ICD-10-CM

## 2016-06-08 DIAGNOSIS — M4800 Spinal stenosis, site unspecified: Secondary | ICD-10-CM

## 2016-06-08 DIAGNOSIS — R7989 Other specified abnormal findings of blood chemistry: Secondary | ICD-10-CM

## 2016-06-08 MED ORDER — GABAPENTIN 300 MG PO CAPS
ORAL_CAPSULE | ORAL | 3 refills | Status: DC
Start: 2016-06-08 — End: 2016-06-20

## 2016-06-09 LAB — HEPATIC FUNCTION PANEL
ALBUMIN: 4.2 g/dL (ref 3.6–4.8)
ALK PHOS: 82 IU/L (ref 39–117)
ALT: 16 IU/L (ref 0–32)
AST: 30 IU/L (ref 0–40)
Bilirubin Total: <0.2 mg/dL (ref 0.0–1.2)
Bilirubin, Direct: 0.08 mg/dL (ref 0.00–0.40)
TOTAL PROTEIN: 6.9 g/dL (ref 6.0–8.5)

## 2016-06-19 ENCOUNTER — Encounter: Payer: Self-pay | Admitting: Emergency Medicine

## 2016-06-19 ENCOUNTER — Emergency Department
Admission: EM | Admit: 2016-06-19 | Discharge: 2016-06-20 | Disposition: A | Discharge status: Home / Self Care | Payer: Self-pay | Attending: Emergency Medicine | Admitting: Emergency Medicine

## 2016-06-19 DIAGNOSIS — F17210 Nicotine dependence, cigarettes, uncomplicated: Secondary | ICD-10-CM | POA: Insufficient documentation

## 2016-06-19 DIAGNOSIS — Z23 Encounter for immunization: Secondary | ICD-10-CM | POA: Insufficient documentation

## 2016-06-19 DIAGNOSIS — S81801A Unspecified open wound, right lower leg, initial encounter: Secondary | ICD-10-CM | POA: Insufficient documentation

## 2016-06-19 DIAGNOSIS — Y939 Activity, unspecified: Secondary | ICD-10-CM | POA: Insufficient documentation

## 2016-06-19 DIAGNOSIS — Y999 Unspecified external cause status: Secondary | ICD-10-CM | POA: Insufficient documentation

## 2016-06-19 DIAGNOSIS — F1910 Other psychoactive substance abuse, uncomplicated: Secondary | ICD-10-CM | POA: Insufficient documentation

## 2016-06-19 DIAGNOSIS — W1809XA Striking against other object with subsequent fall, initial encounter: Secondary | ICD-10-CM | POA: Insufficient documentation

## 2016-06-19 DIAGNOSIS — I10 Essential (primary) hypertension: Secondary | ICD-10-CM | POA: Insufficient documentation

## 2016-06-19 DIAGNOSIS — J449 Chronic obstructive pulmonary disease, unspecified: Secondary | ICD-10-CM | POA: Insufficient documentation

## 2016-06-19 DIAGNOSIS — Z8542 Personal history of malignant neoplasm of other parts of uterus: Secondary | ICD-10-CM | POA: Insufficient documentation

## 2016-06-19 DIAGNOSIS — J45909 Unspecified asthma, uncomplicated: Secondary | ICD-10-CM | POA: Insufficient documentation

## 2016-06-19 DIAGNOSIS — Z79899 Other long term (current) drug therapy: Secondary | ICD-10-CM | POA: Insufficient documentation

## 2016-06-19 DIAGNOSIS — Y929 Unspecified place or not applicable: Secondary | ICD-10-CM | POA: Insufficient documentation

## 2016-06-19 HISTORY — DX: Other psychoactive substance abuse, uncomplicated: F19.10

## 2016-06-19 LAB — URINE DRUG SCREEN, QUALITATIVE (ARMC ONLY)
AMPHETAMINES, UR SCREEN: NOT DETECTED
BARBITURATES, UR SCREEN: NOT DETECTED
BENZODIAZEPINE, UR SCRN: NOT DETECTED
Cannabinoid 50 Ng, Ur ~~LOC~~: NOT DETECTED
Cocaine Metabolite,Ur ~~LOC~~: POSITIVE — AB
MDMA (Ecstasy)Ur Screen: POSITIVE — AB
METHADONE SCREEN, URINE: NOT DETECTED
OPIATE, UR SCREEN: NOT DETECTED
Phencyclidine (PCP) Ur S: NOT DETECTED
TRICYCLIC, UR SCREEN: POSITIVE — AB

## 2016-06-19 LAB — URINALYSIS, ROUTINE W REFLEX MICROSCOPIC
Bilirubin Urine: NEGATIVE
GLUCOSE, UA: 150 mg/dL — AB
Hgb urine dipstick: NEGATIVE
KETONES UR: NEGATIVE mg/dL
LEUKOCYTES UA: NEGATIVE
Nitrite: NEGATIVE
PROTEIN: NEGATIVE mg/dL
Specific Gravity, Urine: 1.015 (ref 1.005–1.030)
pH: 6 (ref 5.0–8.0)

## 2016-06-19 LAB — CBC
HEMATOCRIT: 39.8 % (ref 35.0–47.0)
Hemoglobin: 13.5 g/dL (ref 12.0–16.0)
MCH: 31 pg (ref 26.0–34.0)
MCHC: 33.9 g/dL (ref 32.0–36.0)
MCV: 91.4 fL (ref 80.0–100.0)
Platelets: 293 10*3/uL (ref 150–440)
RBC: 4.36 MIL/uL (ref 3.80–5.20)
RDW: 13.8 % (ref 11.5–14.5)
WBC: 10 10*3/uL (ref 3.6–11.0)

## 2016-06-19 LAB — COMPREHENSIVE METABOLIC PANEL
ALT: 87 U/L — AB (ref 14–54)
AST: 93 U/L — AB (ref 15–41)
Albumin: 4.1 g/dL (ref 3.5–5.0)
Alkaline Phosphatase: 78 U/L (ref 38–126)
Anion gap: 7 (ref 5–15)
BUN: 35 mg/dL — AB (ref 6–20)
CALCIUM: 9.5 mg/dL (ref 8.9–10.3)
CHLORIDE: 101 mmol/L (ref 101–111)
CO2: 30 mmol/L (ref 22–32)
CREATININE: 1.77 mg/dL — AB (ref 0.44–1.00)
GFR calc Af Amer: 34 mL/min — ABNORMAL LOW (ref >60)
GFR, EST NON AFRICAN AMERICAN: 29 mL/min — AB (ref >60)
GLUCOSE: 144 mg/dL — AB (ref 65–99)
Potassium: 4.5 mmol/L (ref 3.5–5.1)
Sodium: 138 mmol/L (ref 135–145)
TOTAL PROTEIN: 7.7 g/dL (ref 6.5–8.1)
Total Bilirubin: 0.5 mg/dL (ref 0.3–1.2)

## 2016-06-19 LAB — ETHANOL

## 2016-06-19 LAB — ACETAMINOPHEN LEVEL: Acetaminophen (Tylenol), Serum: <10 ug/mL — ABNORMAL LOW (ref 10–30)

## 2016-06-19 LAB — SALICYLATE LEVEL: Salicylate Lvl: <7 mg/dL (ref 2.8–30.0)

## 2016-06-19 MED ORDER — BACITRACIN ZINC 500 UNIT/GM EX OINT
TOPICAL_OINTMENT | Freq: Once | CUTANEOUS | Status: AC
  Administered 2016-06-19: 1 via TOPICAL

## 2016-06-19 MED ORDER — TETANUS-DIPHTH-ACELL PERTUSSIS 5-2.5-18.5 LF-MCG/0.5 IM SUSP
0.5000 mL | Freq: Once | INTRAMUSCULAR | Status: AC
  Administered 2016-06-19: 0.5 mL via INTRAMUSCULAR
  Filled 2016-06-19: qty 0.5

## 2016-06-19 NOTE — Discharge Instructions (Signed)

## 2016-06-19 NOTE — ED Notes (Signed)

## 2016-06-19 NOTE — ED Provider Notes (Signed)
[facility] Emergency Department Provider Note  ____________________________________________   First MD Initiated Contact with Patient 06/19/16 2029     (approximate)  I have reviewed the triage vital signs and the nursing notes.   HISTORY  Chief Complaint Mental Health Problem  Possible polysubstance abuse which could limit the accuracy of the history  HPI Kristen Mills is a 66 y.o. female with a medical history as listed below including  Ongoing polysubstance abuse who presents accompanied by law enforcement due to her son taking out involuntary commitment papers on her.  His allegation is that she is a substance abuser who is training herself and recently injured her right lower leg and refused to seek medical treatment and cannot make her own decisions.  The patient states this is absolutely false and that she has not even seen her son for a year.  She admits to drug use but states that she has no intention of hurting herself or anyone else.  She states that the skin tear on her lower leg was because she hit it on the car door within the last couple of days when she slipped on the ice.  She does not know the date of her last tetanus vaccination.  The rest of her review of systems is negative as listed below.  Past Medical History:  Diagnosis Date  . Asthma   . Cancer [facility])    Uterine Cancer  . COPD (chronic obstructive pulmonary disease) ([place])   . Hypertension   . Polysubstance abuse     Patient Active Problem List   Diagnosis Date Noted  . Hepatitis C 11/03/2015  . Asthma 07/02/2015  . Acute bronchitis with COPD ([place]) 04/09/2015  . Hyperlipemia 04/09/2015  . ETOH abuse 12/30/2014  . Insomnia 12/30/2014  . Spinal stenosis 11/20/2014  . Hypertension 08/20/2014  . Tobacco abuse 09/24/2013    Past Surgical History:  Procedure Laterality Date  . ABDOMINAL HYSTERECTOMY      Prior to Admission medications   Medication Sig Start Date End Date  Taking? Authorizing Provider  albuterol (VENTOLIN HFA) 108 (90 Base) MCG/ACT inhaler Inhale 2 puffs into the lungs every 6 (six) hours as needed for wheezing or shortness of breath.    Historical Provider, MD  cetirizine (ZYRTEC) 10 MG tablet TAKE ONE TABLET BY MOUTH EVERY DAY. REPLACES LORATADINE. 06/02/16   Nori Riis, PA-C  fenofibrate (TRICOR) 145 MG tablet Take 1 tablet (145 mg total) by mouth daily. 11/12/15   Larene Beach A McGowan, PA-C  fluticasone (FLONASE) 50 MCG/ACT nasal spray Place 2 sprays into both nostrils daily. 11/12/15   Nori Riis, PA-C  Fluticasone-Salmeterol (ADVAIR DISKUS) 250-50 MCG/DOSE AEPB Inhale 2 puffs into the lungs every 6 (six) hours as needed.    Historical Provider, MD  gabapentin (NEURONTIN) 300 MG capsule TAKE 3 CAPSULES BY MOUTH 3 TIMES A DAY. Please place prescription on hold until patient calls for refill 06/08/16   Teah Doles-Johnson, NP  gemfibrozil (LOPID) 600 MG tablet Take 1 tablet (600 mg total) by mouth 2 (two) times daily before a meal. 05/03/16   Teah Doles-Johnson, NP  guaifenesin (ROBITUSSIN) 100 MG/5ML syrup Take 5 mLs (100 mg total) by mouth 3 (three) times daily as needed for cough. Patient not taking: Reported on 11/17/2015 08/04/15   Birdie Sons, MD  lisinopril (PRINIVIL,ZESTRIL) 5 MG tablet Take 1 tablet (5 mg total) by mouth daily. 05/03/16   Teah Doles-Johnson, NP  nortriptyline (PAMELOR) 25 MG capsule TAKE  ONE TABLET BY MOUTH EVERY DAY 03/30/16   Nori Riis, PA-C  predniSONE (DELTASONE) 5 MG tablet By mouth, begin with 8 tablets, each day decrease by one tab, 8-7-6-5-4-3-2-1 04/19/16   Boyce Medici, FNP    Allergies Patient has no known allergies.  Family History  Problem Relation Age of Onset  . Cancer Mother     Lung  . Cancer Father     Lung  . Heart disease Sister   . Heart attack Sister   . Cancer Brother     Lung  . Bipolar disorder Son     Social History Social History  Substance Use Topics  . Smoking status:  Current Every Day Smoker    Packs/day: 1.00    Years: 45.00    Types: Cigarettes  . Smokeless tobacco: Never Used  . Alcohol use 4.2 oz/week    7 Cans of beer per week     Comment: Few beers daily.    Review of Systems Constitutional: No fever/chills Eyes: No visual changes. ENT: No sore throat. Cardiovascular: Denies chest pain. Respiratory: Denies shortness of breath. Gastrointestinal: No abdominal pain.  No nausea, no vomiting.  No diarrhea.  No constipation. Genitourinary: Negative for dysuria. Musculoskeletal: Negative for back pain. Skin: Negative for rash.  Skin tear no lower leg Neurological: Negative for headaches, focal weakness or numbness.  10-point ROS otherwise negative.  ____________________________________________   PHYSICAL EXAM:  VITAL SIGNS: ED Triage Vitals  Enc Vitals Group     BP 06/19/16 1903 120/87     Pulse --      Resp 06/19/16 1903 (!) 22     Temp 06/19/16 1903 98.4 F (36.9 C)     Temp Source 06/19/16 1903 Oral     SpO2 06/19/16 1903 93 %     Weight 06/19/16 1905 152 lb (68.9 kg)     Height 06/19/16 1905 5\' 2"  (1.575 m)     Head Circumference --      Peak Flow --      Pain Score 06/19/16 1905 0     Pain Loc --      Pain Edu? --      Excl. in [place]? --     Constitutional: Alert and oriented. No acute distress. Eyes: Conjunctivae are normal. PERRL. EOMI. Head: Atraumatic. Nose: No congestion/rhinnorhea. Mouth/Throat: Mucous membranes are moist. Neck: No stridor.  No meningeal signs.  No cervical spine tenderness to palpation. Cardiovascular: Normal rate, regular rhythm. Good peripheral circulation. Grossly normal heart sounds. Respiratory: Normal respiratory effort.  No retractions. Lungs CTAB. Gastrointestinal: Soft and nontender. No distention.  Musculoskeletal: No lower extremity tenderness nor edema. No gross deformities of extremities. Neurologic:  Normal speech and language. No gross focal neurologic deficits are appreciated.    Skin:  Skin is warm, dry and she has approximately a 6 cm skin tear that appears subacute on her right lower leg.  Nursing cleansed the wound and applied bacitracin ointment and a dressing. Psychiatric: Speech is pressured but she is upset at the situation.  Ambulatory without difficulty and appears clinically sober.  Denies SI/HI, states she does not know why  her son is doing this.  Denies depression. ____________________________________________   LABS (all labs ordered are listed, but only abnormal results are displayed)  Labs Reviewed  COMPREHENSIVE METABOLIC PANEL - Abnormal; Notable for the following:       Result Value   Glucose, Bld 144 (*)    BUN 35 (*)    Creatinine,  Ser 1.77 (*)    AST 93 (*)    ALT 87 (*)    GFR calc non Af Amer 29 (*)    GFR calc Af Amer 34 (*)    All other components within normal limits  URINE DRUG SCREEN, QUALITATIVE ([facility] ONLY) - Abnormal; Notable for the following:    Tricyclic, Ur Screen POSITIVE (*)    MDMA (Ecstasy)Ur Screen POSITIVE (*)    Cocaine Metabolite,Ur [place] POSITIVE (*)    All other components within normal limits  URINALYSIS, ROUTINE W REFLEX MICROSCOPIC - Abnormal; Notable for the following:    Color, Urine YELLOW (*)    APPearance CLEAR (*)    Glucose, UA 150 (*)    All other components within normal limits  ACETAMINOPHEN LEVEL - Abnormal; Notable for the following:    Acetaminophen (Tylenol), Serum <10 (*)    All other components within normal limits  CBC  ETHANOL  SALICYLATE LEVEL   ____________________________________________  EKG  None - EKG not ordered by ED physician ____________________________________________  RADIOLOGY   No results found.  ____________________________________________   PROCEDURES  Critical Care performed: No   Procedure(s) performed:   Procedures   ____________________________________________   INITIAL IMPRESSION / ASSESSMENT AND PLAN / ED COURSE  Pertinent labs & imaging  results that were available during my care of the patient were reviewed by me and considered in my medical decision making (see chart for details).  The situation is unclear and confusing.  The patient is adamant that she is not suicidal and admits to drug use.  She states that her son took time to take out involuntary commitment papers on her but has not seen her for a year.  It is not clear that she meets criteria for involuntary commitment but I will seek the assistance of specialist on-call psychiatry and TTS to try and obtain a lateral and learn more about the situation.  I explained this to the patient and she agrees with the plan and is calm and cooperative.      ____________________________________________  FINAL CLINICAL IMPRESSION(S) / ED DIAGNOSES  Final diagnoses:  None     MEDICATIONS GIVEN DURING THIS VISIT:  Medications  Tdap (BOOSTRIX) injection 0.5 mL (0.5 mLs Intramuscular Given 06/19/16 2249)  bacitracin ointment (1 application Topical Given 06/19/16 2248)     NEW OUTPATIENT MEDICATIONS STARTED DURING THIS VISIT:  New Prescriptions   No medications on file    Modified Medications   No medications on file    Discontinued Medications   No medications on file     Note:  This document was prepared using Dragon voice recognition software and may include unintentional dictation errors.    Hinda Kehr, MD 06/19/16 2325

## 2016-06-19 NOTE — BH Assessment (Signed)
Assessment Note  Kristen Mills is an 66 y.o. female. Kristen. Mills arrived to the ED by way of [facility] department.  She reports that her son had her committed. She report that she has no ideal of what is going on and that she has not seen or talk to him since January 21st.  She reports some depression. Her sister passed away last year, and she was close to her. She has a poor relationship with her son after an incident with his wife and her family.  She denied any symptoms of anxiety.  She denied having auditory or visual hallucinations.  She denied suicidal or homicidal ideation or intent. She reports use of some marijuana and cocaine.  Reports that she uses it occasionally. IVC paperwork reports "Respondent is a substance abuser.  She uses crack cocaine, methamphetamine and alcohol on a daily basis until she passes out. Today she has fallen due to intoxication, cut her leg and will not seek medical help". Son Kristen Mills, Kristen Mills. [phone] reports that she has a very strong substance abuse problem.  She is getting paid by social security on the second of the month and within a week she is out of money.  He reports that this has been an ongoing problem for years.  He reports that his aunt called him today and stated that something must be done. He reports that he has not seen or spoken to her today.  He heard from his aunt that she fell and that she was bleeding and that she needed money for cigarettes and beer. The aunt stated that she could not come, and the aunt called him.  Diagnosis:   Past Medical History:  Past Medical History:  Diagnosis Date  . Asthma   . Cancer [facility])    Uterine Cancer  . COPD (chronic obstructive pulmonary disease) ([place])   . Hypertension     Past Surgical History:  Procedure Laterality Date  . ABDOMINAL HYSTERECTOMY      Family History:  Family History  Problem Relation Age of Onset  . Cancer Mother     Lung  . Cancer Father     Lung  . Heart disease  Sister   . Heart attack Sister   . Cancer Brother     Lung  . Bipolar disorder Son     Social History:  reports that she has been smoking Cigarettes.  She has a 45.00 pack-year smoking history. She has never used smokeless tobacco. She reports that she drinks about 4.2 oz of alcohol per week . She reports that she uses drugs, including "Crack" cocaine and Marijuana.  Additional Social History:  Alcohol / Drug Use History of alcohol / drug use?: Yes Substance #1 Name of Substance 1: Crack/Cocaine 1 - Age of First Use: "I don't remember" 1 - Amount (size/oz): $20.00 1 - Frequency: 2-3 times a week 1 - Last Use / Amount: 06/18/2016 Substance #2 Name of Substance 2: Marijuana 2 - Age of First Use: "I don't know" 2 - Amount (size/oz): Unsure 2 - Frequency: every now and then 2 - Last Use / Amount: Unsure  CIWA: CIWA-Ar BP: 120/87 COWS:    Allergies: No Known Allergies  Home Medications:  (Not in a hospital admission)  OB/GYN Status:  No LMP recorded.  General Assessment Data Location of Assessment: [facility] ED TTS Assessment: In system Is this a Tele or Face-to-Face Assessment?: Face-to-Face Is this an Initial Assessment or a Re-assessment for this encounter?: Initial Assessment  Marital status: Widowed Kristen Mills name: Renato Battles Is patient pregnant?: No Pregnancy Status: No Living Arrangements: Alone Can pt return to current living arrangement?: Yes Admission Status: Involuntary Is patient capable of signing voluntary admission?: Yes Referral Source: Self/Family/Friend Insurance type: None  Medical Screening Exam ([place]) Medical Exam completed: Yes  Crisis Care Plan Living Arrangements: Alone Legal Guardian: Other: (Self) Name of Psychiatrist: None Name of Therapist: None  Education Status Is patient currently in school?: No Current Grade: n/a Highest grade of school patient has completed: GED Name of school: [employer] Contact person: n/a  Risk to self with the  past 6 months Suicidal Ideation: No Has patient been a risk to self within the past 6 months prior to admission? : No Suicidal Intent: No Has patient had any suicidal intent within the past 6 months prior to admission? : No Is patient at risk for suicide?: No Suicidal Plan?: No Has patient had any suicidal plan within the past 6 months prior to admission? : No Access to Means: No What has been your use of drugs/alcohol within the last 12 months?: Use of crack and marijuana Previous Attempts/Gestures: No How many times?: 0 Other Self Harm Risks: denied Triggers for Past Attempts: None known Intentional Self Injurious Behavior: None Family Suicide History: Yes (exhusband) Recent stressful life event(s): Conflict (Comment) (conflict with son and his family) Persecutory voices/beliefs?: No Depression: No Depression Symptoms:  (denied) Substance abuse history and/or treatment for substance abuse?: Yes Suicide prevention information given to non-admitted patients: Not applicable  Risk to Others within the past 6 months Homicidal Ideation: No Does patient have any lifetime risk of violence toward others beyond the six months prior to admission? : No Thoughts of Harm to Others: No Current Homicidal Intent: No Current Homicidal Plan: No Access to Homicidal Means: No Identified Victim: None identified History of harm to others?: No Assessment of Violence: None Noted Violent Behavior Description: None reported Does patient have access to weapons?: No Criminal Charges Pending?: No Does patient have a court date: No Is patient on probation?: No  Psychosis Hallucinations: None noted Delusions: None noted  Mental Status Report Appearance/Hygiene: In scrubs, Disheveled Eye Contact: Good Motor Activity: Hyperactivity Speech: Loud Level of Consciousness: Alert Mood: Euthymic Affect: Appropriate to circumstance Anxiety Level: None Thought Processes: Tangential Judgement:  Unimpaired Orientation: Person, Time, Place, Situation Obsessive Compulsive Thoughts/Behaviors: None  Cognitive Functioning Concentration: Good Memory: Recent Intact IQ: Average Insight: Fair Impulse Control: Fair Appetite: Good Sleep: No Change Vegetative Symptoms: None  ADLScreening [facility] Assessment Services) Patient's cognitive ability adequate to safely complete daily activities?: Yes Patient able to express need for assistance with ADLs?: Yes Independently performs ADLs?: Yes (appropriate for developmental age)  Prior Inpatient Therapy Prior Inpatient Therapy: No Prior Therapy Dates: n/a Prior Therapy Facilty/Provider(s): n/a Reason for Treatment: n/a  Prior Outpatient Therapy Prior Outpatient Therapy: No Prior Therapy Dates: n/a Prior Therapy Facilty/Provider(s): n/a Reason for Treatment: n/a Does patient have an ACCT team?: No Does patient have Intensive In-House Services?  : No Does patient have [facility] services? : No Does patient have P4CC services?: No  ADL Screening (condition at time of admission) Patient's cognitive ability adequate to safely complete daily activities?: Yes Patient able to express need for assistance with ADLs?: Yes Independently performs ADLs?: Yes (appropriate for developmental age)       Abuse/Neglect Assessment (Assessment to be complete while patient is alone) Physical Abuse: Denies Verbal Abuse: Denies Sexual Abuse: Denies Exploitation of patient/patient's resources: Denies Self-Neglect: Denies  Advance Directives (For Healthcare) Does Patient Have a Medical Advance Directive?: No Would patient like information on creating a medical advance directive?: Yes (ED - Information included in AVS)    Additional Information 1:1 In Past 12 Months?: No CIRT Risk: No Elopement Risk: No Does patient have medical clearance?: Yes     Disposition:  Disposition Initial Assessment Completed for this Encounter: Yes Disposition of  Patient: Other dispositions  On Site Evaluation by:   Reviewed with Physician:    Elmer Bales 06/19/2016 9:53 PM

## 2016-06-19 NOTE — ED Notes (Signed)
BEHAVIORAL HEALTH ROUNDING  Patient sleeping: No.  Patient alert and oriented: yes  Behavior appropriate: Yes. ; If no, describe:  Nutrition and fluids offered: Yes  Toileting and hygiene offered: Yes  Sitter present: not applicable, Q 15 min safety rounds and observation.  Law enforcement present: Yes ODS  

## 2016-06-19 NOTE — ED Notes (Signed)
Pt dressed out into appropriate behavioral health clothing. Pt belongings consist of a gray jacket, blue flip flops, black pants, white panties, red shirt, black hair bow, a white bra and a tan purse with pink flowers. Pt has (3) $1's and (6) quarters.

## 2016-06-19 NOTE — ED Notes (Signed)
IVC Called [facility] consult [phone]

## 2016-06-19 NOTE — ED Triage Notes (Addendum)
Pt brought by [place] police with IVC paperwork. Pt states she is uncertain why she is here, states that she was with her friend last night and hit her leg on the car door because she slid on the snow and ice. Pt states that she hasn't spoke to her son since January 21st, paperwork was taken out by son.

## 2016-06-20 NOTE — ED Notes (Signed)
BEHAVIORAL HEALTH ROUNDING  Patient sleeping: No.  Patient alert and oriented: yes  Behavior appropriate: Yes. ; If no, describe:  Nutrition and fluids offered: Yes  Toileting and hygiene offered: Yes  Sitter present: not applicable, Q 15 min safety rounds and observation.  Law enforcement present: Yes ODS  

## 2016-06-20 NOTE — ED Notes (Signed)
Report given to [facility] [address]. Pt in room having consult at this time.

## 2016-06-20 NOTE — ED Provider Notes (Signed)
-----------------------------------------   3:07 AM on 06/20/2016 -----------------------------------------   Blood pressure 135/80, pulse 79, temperature 98.9 F (37.2 C), temperature source Oral, resp. rate 18, height 5\' 2"  (1.575 m), weight 152 lb (68.9 kg), SpO2 99 %.  Assuming care from Dr. Karma Greaser.  In short, Kristen Mills is a 66 y.o. female with a chief complaint of Mental Health Problem .  Refer to the original H&P for additional details.  The current plan of care is to follow up the recommendations of the telemetry psychiatrist..     The patient was seen by psych. She is clear for discharge and does not meet criteria for IVC. They would like the patient referred to outpatient substance abuse and outpatient mental health.   Loney Hering, MD 06/20/16 [phone]

## 2016-06-20 NOTE — ED Notes (Signed)
Camera put into room for [facility] consult. Pt instructed about process and is understanding.

## 2016-06-20 NOTE — ED Notes (Signed)
Pt given snack tray and juice. Pt also encouraged to drink water and given the same.

## 2016-08-04 ENCOUNTER — Other Ambulatory Visit: Payer: Self-pay | Admitting: Internal Medicine

## 2016-08-10 ENCOUNTER — Telehealth: Payer: Self-pay

## 2016-08-10 NOTE — Telephone Encounter (Signed)
Received PAP application from [facility] for Advair & Ventolin placed for provider to sign.

## 2016-08-10 NOTE — Telephone Encounter (Signed)
Placed signed application/script in [facility] folder for pickup. 

## 2016-08-15 ENCOUNTER — Telehealth: Payer: Self-pay | Admitting: Pharmacist

## 2016-08-15 NOTE — Telephone Encounter (Signed)
08/15/16 Faxed script to [facility] for refill on Advair 250/50 Inhale 1 puff two times a day, & Ventolin HFA 90 mcg Inhale 2 puffs every four to six hours as need for shortness of breath.

## 2016-09-05 ENCOUNTER — Other Ambulatory Visit: Payer: Self-pay | Admitting: Internal Medicine

## 2016-09-07 ENCOUNTER — Other Ambulatory Visit: Payer: Self-pay | Admitting: Internal Medicine

## 2016-09-20 ENCOUNTER — Telehealth: Payer: Self-pay | Admitting: Pharmacist

## 2016-09-20 NOTE — Telephone Encounter (Signed)
09/20/16 Placed refill online with [facility] for Advair 250/50-to release 11/17/16, order# [id number].Delos Haring

## 2016-10-05 ENCOUNTER — Other Ambulatory Visit: Payer: Self-pay | Admitting: Internal Medicine

## 2016-10-06 ENCOUNTER — Telehealth: Payer: Self-pay | Admitting: Pharmacist

## 2016-10-06 NOTE — Telephone Encounter (Signed)
Ventolin HFA-Placed refill today 10/06/16 with [facility] for Ventolin HFA, they will release 11/22/16.Delos Haring

## 2016-11-02 ENCOUNTER — Other Ambulatory Visit: Payer: Self-pay | Admitting: Internal Medicine

## 2016-11-02 DIAGNOSIS — M4800 Spinal stenosis, site unspecified: Secondary | ICD-10-CM

## 2016-12-07 ENCOUNTER — Other Ambulatory Visit: Payer: Self-pay | Admitting: Internal Medicine

## 2016-12-20 ENCOUNTER — Telehealth: Payer: Self-pay | Admitting: Pharmacist

## 2016-12-20 NOTE — Telephone Encounter (Signed)
12/20/16 Placed refill online with [facility] for Advair & Ventolin, to release 01/24/17, order# [id number].Delos Haring

## 2017-01-04 ENCOUNTER — Other Ambulatory Visit: Payer: Self-pay | Admitting: Internal Medicine

## 2017-01-09 ENCOUNTER — Encounter (INDEPENDENT_AMBULATORY_CARE_PROVIDER_SITE_OTHER): Payer: Self-pay

## 2017-01-09 ENCOUNTER — Encounter: Payer: Self-pay | Admitting: Pharmacist

## 2017-01-09 ENCOUNTER — Ambulatory Visit: Payer: Self-pay | Admitting: Pharmacist

## 2017-01-09 ENCOUNTER — Other Ambulatory Visit: Payer: Self-pay | Admitting: Internal Medicine

## 2017-01-09 DIAGNOSIS — Z79899 Other long term (current) drug therapy: Secondary | ICD-10-CM

## 2017-01-10 ENCOUNTER — Other Ambulatory Visit: Payer: Self-pay

## 2017-01-10 DIAGNOSIS — M4800 Spinal stenosis, site unspecified: Secondary | ICD-10-CM

## 2017-01-10 MED ORDER — GABAPENTIN 300 MG PO CAPS: ORAL_CAPSULE | ORAL | 0 refills | Status: DC

## 2017-01-12 NOTE — Progress Notes (Signed)
Patient is here today to review Medicare Part D/Advantage plans.  Patient's current medication list was reviewed and all of her medications can be obtained through a Medicare Part D/Advantage plan.  Patient is eligible to sign up for a Medicare Part D/Advantage plan as her Medicare starts 01/26/17.  Medicare.gov was used to review the available Part D/Advantage plans.  Patient qualifies for the following subsidies: The online extra help application was filled out. She should receive notification from [place] if she qualifies for LIS*. She is very close to the cutoff for partial subsidy.  Patient has chosen the [place] 1. Patient chose this plan based on her provider and medications. She also wanted a plan that had a zero monthly premium. She is not interested in dental or vision coverage at this time. Patient asked me to assist her in signing up for a plan and signed our waiver.  Patient was encouraged to follow up with me in November to review the 2019 plans.    *LIS = Low Income Subsidy (Assists with Medicare Part D - premiums, deductibles and copayments)   Riordan Walle K. Dicky Doe, PharmD [facility] [place] Operations Coordinator [phone]

## 2017-01-13 ENCOUNTER — Telehealth: Payer: Self-pay | Admitting: Pharmacy Technician

## 2017-01-13 NOTE — Telephone Encounter (Signed)
Patient signed-up for Medicare Part D plan.  No longer eligible to receive medication assistance with [facility].  Patient notified.  [facility]

## 2017-02-06 ENCOUNTER — Encounter: Payer: Self-pay | Admitting: Pharmacist

## 2017-02-20 ENCOUNTER — Encounter (INDEPENDENT_AMBULATORY_CARE_PROVIDER_SITE_OTHER): Payer: Self-pay

## 2017-02-20 ENCOUNTER — Ambulatory Visit: Payer: Self-pay | Admitting: Pharmacist

## 2017-02-20 ENCOUNTER — Other Ambulatory Visit: Payer: Self-pay

## 2017-02-20 DIAGNOSIS — Z79899 Other long term (current) drug therapy: Secondary | ICD-10-CM

## 2017-02-20 NOTE — Patient Instructions (Signed)
Medication Management will mail info to DSS to review for Medicaid eligibility  Call Dr. Laurelyn Sickle office to make an appointment  Dr. will need to write a new Rx for ProAir to replace Ventolin when needed  May consider mail order to reduce monthly copays  Call Medication Management after the first of the year if you want to review Medicare Advantage plans. In 2019, your current plan will have dental, vision and hearing

## 2017-02-23 ENCOUNTER — Ambulatory Visit: Payer: Self-pay | Admitting: Adult Health Nurse Practitioner

## 2017-02-23 VITALS — BP 92/64 | HR 125 | Temp 97.9°F | Ht 62.0 in | Wt 136.9 lb

## 2017-02-23 DIAGNOSIS — R7303 Prediabetes: Secondary | ICD-10-CM | POA: Insufficient documentation

## 2017-02-23 DIAGNOSIS — J45909 Unspecified asthma, uncomplicated: Secondary | ICD-10-CM

## 2017-02-23 DIAGNOSIS — M4800 Spinal stenosis, site unspecified: Secondary | ICD-10-CM

## 2017-02-23 DIAGNOSIS — R739 Hyperglycemia, unspecified: Secondary | ICD-10-CM

## 2017-02-23 DIAGNOSIS — R7309 Other abnormal glucose: Secondary | ICD-10-CM | POA: Insufficient documentation

## 2017-02-23 DIAGNOSIS — I10 Essential (primary) hypertension: Secondary | ICD-10-CM

## 2017-02-23 MED ORDER — NORTRIPTYLINE HCL 25 MG PO CAPS: 25.0000 mg | ORAL_CAPSULE | Freq: Every day | ORAL | 3 refills | Status: DC

## 2017-02-23 MED ORDER — GABAPENTIN 300 MG PO CAPS: ORAL_CAPSULE | ORAL | 3 refills | Status: DC

## 2017-02-23 NOTE — Progress Notes (Signed)
   Subjective:    Patient ID: Kristen Mills, female    DOB: 11-30-1950, 66 y.o.   MRN: [id number]  HPI  Kristen Mills is a 66 yo female. She has not been to see [place] since March. She is here for med refill and has been out for 2 weeks.   Patient Active Problem List   Diagnosis Date Noted  . Hepatitis C 11/03/2015  . Asthma 07/02/2015  . Acute bronchitis with COPD ([place]) 04/09/2015  . Hyperlipemia 04/09/2015  . ETOH abuse 12/30/2014  . Insomnia 12/30/2014  . Spinal stenosis 11/20/2014  . Hypertension 08/20/2014  . Tobacco abuse 09/24/2013   Allergies as of 02/23/2017   No Known Allergies     Medication List        Accurate as of 02/23/17  7:57 PM. Always use your most recent med list.          ADVAIR DISKUS 250-50 MCG/DOSE Aepb Generic drug:  Fluticasone-Salmeterol Inhale 1 puff into the lungs 2 (two) times daily.   cetirizine 10 MG tablet Commonly known as:  ZYRTEC TAKE ONE TABLET BY MOUTH EVERY DAY. REPLACES LORATADINE.   fenofibrate 145 MG tablet Commonly known as:  TRICOR Take 1 tablet by mouth daily.   fluticasone 50 MCG/ACT nasal spray Commonly known as:  FLONASE Place 2 sprays into both nostrils daily.   gabapentin 300 MG capsule Commonly known as:  NEURONTIN TAKE 3 CAPSULES BY MOUTH 3 TIMES A DAY.   lisinopril 5 MG tablet Commonly known as:  PRINIVIL,ZESTRIL TAKE ONE TABLET BY MOUTH EVERY DAY   nortriptyline 25 MG capsule Commonly known as:  PAMELOR TAKE ONE CAPSULE BY MOUTH EVERY DAY   VENTOLIN HFA 108 (90 Base) MCG/ACT inhaler Generic drug:  albuterol INHALE 2 PUFFS EVERY FOUR TO SIX HOURS AS NEEDED FOR SHORTNESS OF BREATH.        Review of Systems Spinal Stenosis - pt reports Gabapentin helps with her pain. Hypertension - pt reports her BP has been low and she has been feeling weak.  COPD - pt reports burning in her lungs for a few days. She does not want to see the pulmonologist until after Christmas. She denies SOB and wheezing. Pt has a  rescue inhaler and she uses it 1-2 times/day.  Pt smokes.  Prediabetes - last glucose at 134.     Objective:   Physical Exam  Constitutional: She is oriented to person, place, and time. She appears well-developed and well-nourished.  Cardiovascular: Normal rate, regular rhythm and normal heart sounds.  Pulmonary/Chest: She has decreased breath sounds in the right lower field and the left lower field.  Abdominal: Soft. Bowel sounds are normal.  Neurological: She is alert and oriented to person, place, and time.  Vitals reviewed.   BP 92/64 (BP Location: Right Arm, Patient Position: Sitting, Cuff Size: Normal)   Pulse (!) 125   Temp 97.9 F (36.6 C)   Ht 5\' 2"  (1.575 m)   Wt 136 lb 14.4 oz (62.1 kg)   LMP  (Approximate) Comment: had a partial hysterectomy at 28 years  BMI 25.04 kg/m        Assessment & Plan:   Stop Lisinopril b/c of low BP.  Monitor for diabetes at next visit. F/u in 6 weeks. Labs a week prior.  Pt has elevated BS- will order A1c and treat accordingly.

## 2017-03-01 NOTE — Progress Notes (Signed)
Patient is here today to review Medicare Part D/Advantage plans for 2019. She recently enrolled into the [employer] Team Advantage Plan 1 for 2018   Patient's current medication list was reviewed and all of her medications can be obtained through a Medicare Part D/Advantage plan. Ventolin is not covered, however, ProAir is covered.  Medicare.gov was used to review the available Part D/Advantage plans.  Patient qualifies for the following subsidies: Partial LIS*. This will allow her to change her plan as needed. She will not have to wait for the annual enrollment period. She will not fall into the coverage gap with the subsidy.  *LIS = Low Income Subsidy (Assists with Medicare Part D - premiums, deductibles and copayments)   PLAN: Patient will continue with the Health Team Advantage Plan 1. In 2019, this plan will have dental, vision and hearing coverage. Patient was encouraged to call Dr. Laurelyn Sickle office to make an appointment for prescription refills; also needs the Ventolin Rx to be changed to ProAir Patient was encouraged to consider mail order to reduce copays   Mane Consolo K. Dicky Doe, PharmD [facility] [place] Operations Coordinator [phone]

## 2017-04-06 ENCOUNTER — Other Ambulatory Visit: Payer: Self-pay

## 2017-04-13 ENCOUNTER — Ambulatory Visit: Payer: Self-pay

## 2017-07-18 DIAGNOSIS — E785 Hyperlipidemia, unspecified: Secondary | ICD-10-CM | POA: Diagnosis not present

## 2017-07-18 DIAGNOSIS — R760 Raised antibody titer: Secondary | ICD-10-CM | POA: Diagnosis not present

## 2017-07-18 DIAGNOSIS — Z1329 Encounter for screening for other suspected endocrine disorder: Secondary | ICD-10-CM | POA: Diagnosis not present

## 2017-07-18 DIAGNOSIS — I10 Essential (primary) hypertension: Secondary | ICD-10-CM | POA: Diagnosis not present

## 2017-07-18 DIAGNOSIS — R7303 Prediabetes: Secondary | ICD-10-CM | POA: Diagnosis not present

## 2017-07-18 DIAGNOSIS — Z1389 Encounter for screening for other disorder: Secondary | ICD-10-CM | POA: Diagnosis not present

## 2017-07-18 DIAGNOSIS — M4800 Spinal stenosis, site unspecified: Secondary | ICD-10-CM | POA: Diagnosis not present

## 2017-07-18 DIAGNOSIS — J449 Chronic obstructive pulmonary disease, unspecified: Secondary | ICD-10-CM | POA: Diagnosis not present

## 2017-07-18 DIAGNOSIS — Z90710 Acquired absence of both cervix and uterus: Secondary | ICD-10-CM | POA: Diagnosis not present

## 2017-07-18 DIAGNOSIS — F330 Major depressive disorder, recurrent, mild: Secondary | ICD-10-CM | POA: Diagnosis not present

## 2017-07-18 DIAGNOSIS — Z780 Asymptomatic menopausal state: Secondary | ICD-10-CM | POA: Diagnosis not present

## 2017-07-18 DIAGNOSIS — F1421 Cocaine dependence, in remission: Secondary | ICD-10-CM | POA: Diagnosis not present

## 2017-10-17 DIAGNOSIS — F5101 Primary insomnia: Secondary | ICD-10-CM | POA: Diagnosis not present

## 2017-10-17 DIAGNOSIS — R52 Pain, unspecified: Secondary | ICD-10-CM | POA: Diagnosis not present

## 2017-10-17 DIAGNOSIS — Z760 Encounter for issue of repeat prescription: Secondary | ICD-10-CM | POA: Diagnosis not present

## 2017-11-09 ENCOUNTER — Telehealth: Payer: Self-pay

## 2017-11-09 NOTE — Telephone Encounter (Signed)
Not a patient at [facility]

## 2018-02-07 DIAGNOSIS — R52 Pain, unspecified: Secondary | ICD-10-CM | POA: Diagnosis not present

## 2018-02-07 DIAGNOSIS — Z760 Encounter for issue of repeat prescription: Secondary | ICD-10-CM | POA: Diagnosis not present

## 2018-02-07 DIAGNOSIS — J4520 Mild intermittent asthma, uncomplicated: Secondary | ICD-10-CM | POA: Diagnosis not present

## 2018-02-07 DIAGNOSIS — Z23 Encounter for immunization: Secondary | ICD-10-CM | POA: Diagnosis not present

## 2018-02-07 DIAGNOSIS — F5101 Primary insomnia: Secondary | ICD-10-CM | POA: Diagnosis not present

## 2018-08-15 ENCOUNTER — Other Ambulatory Visit: Payer: Self-pay

## 2018-08-15 ENCOUNTER — Telehealth: Payer: Self-pay | Admitting: Nurse Practitioner

## 2018-08-15 ENCOUNTER — Ambulatory Visit (INDEPENDENT_AMBULATORY_CARE_PROVIDER_SITE_OTHER): Payer: PPO | Admitting: Nurse Practitioner

## 2018-08-15 ENCOUNTER — Encounter: Payer: Self-pay | Admitting: Nurse Practitioner

## 2018-08-15 ENCOUNTER — Ambulatory Visit (INDEPENDENT_AMBULATORY_CARE_PROVIDER_SITE_OTHER): Payer: PPO | Admitting: Pharmacist

## 2018-08-15 VITALS — BP 130/78 | HR 94 | Temp 98.0°F | Ht 60.0 in | Wt 138.0 lb

## 2018-08-15 DIAGNOSIS — F17219 Nicotine dependence, cigarettes, with unspecified nicotine-induced disorders: Secondary | ICD-10-CM | POA: Diagnosis not present

## 2018-08-15 DIAGNOSIS — R7303 Prediabetes: Secondary | ICD-10-CM

## 2018-08-15 DIAGNOSIS — J410 Simple chronic bronchitis: Secondary | ICD-10-CM | POA: Diagnosis not present

## 2018-08-15 DIAGNOSIS — F331 Major depressive disorder, recurrent, moderate: Secondary | ICD-10-CM

## 2018-08-15 DIAGNOSIS — F1410 Cocaine abuse, uncomplicated: Secondary | ICD-10-CM

## 2018-08-15 DIAGNOSIS — I10 Essential (primary) hypertension: Secondary | ICD-10-CM

## 2018-08-15 DIAGNOSIS — B182 Chronic viral hepatitis C: Secondary | ICD-10-CM

## 2018-08-15 DIAGNOSIS — F5104 Psychophysiologic insomnia: Secondary | ICD-10-CM | POA: Diagnosis not present

## 2018-08-15 DIAGNOSIS — M48062 Spinal stenosis, lumbar region with neurogenic claudication: Secondary | ICD-10-CM

## 2018-08-15 MED ORDER — FLUTICASONE-SALMETEROL 250-50 MCG/DOSE IN AEPB: 1.0000 | INHALATION_SPRAY | Freq: Two times a day (BID) | RESPIRATORY_TRACT | 5 refills | Status: DC

## 2018-08-15 MED ORDER — CETIRIZINE HCL 10 MG PO TABS: ORAL_TABLET | ORAL | 2 refills | Status: DC

## 2018-08-15 MED ORDER — FLUTICASONE FUROATE 27.5 MCG/SPRAY NA SUSP: 2.0000 | Freq: Every day | NASAL | 3 refills | Status: DC

## 2018-08-15 MED ORDER — ALBUTEROL SULFATE HFA 108 (90 BASE) MCG/ACT IN AERS: INHALATION_SPRAY | RESPIRATORY_TRACT | 5 refills | Status: DC

## 2018-08-15 MED ORDER — HYDROXYZINE HCL 10 MG PO TABS: 10.0000 mg | ORAL_TABLET | Freq: Two times a day (BID) | ORAL | 0 refills | Status: DC | PRN

## 2018-08-15 MED ORDER — TRAZODONE HCL 50 MG PO TABS: 25.0000 mg | ORAL_TABLET | Freq: Every day | ORAL | 3 refills | Status: DC

## 2018-08-15 MED ORDER — AMLODIPINE BESYLATE 5 MG PO TABS: 5.0000 mg | ORAL_TABLET | Freq: Every day | ORAL | 3 refills | Status: DC

## 2018-08-15 NOTE — Assessment & Plan Note (Signed)
Chronic, ongoing.  Initial BP elevated with repeat improved on manual check.  Previously on Lisinopril, due to COPD will not restart this.  Script for Amlodipine sent and have recommended patient check BP 3 times a week.  Return in 4 weeks for physical and labs.

## 2018-08-15 NOTE — Assessment & Plan Note (Signed)
Recheck A1C next visit during physical.

## 2018-08-15 NOTE — Chronic Care Management (AMB) (Signed)
Chronic Care Management   Note  08/15/2018 Name: Kristen Mills MRN: [id number] DOB: 01/23/1951   Subjective:  Patient is a 68 year old female establishing care with Kristen Guarneri, NP; referred to chronic care management for support in managing COPD, HLD, tobacco abuse   Ms. Kristen Mills was given information about Chronic Care Management services today including:  1. CCM service includes personalized support from designated clinical staff supervised by her physician, including individualized plan of care and coordination with other care providers 2. 24/7 contact phone numbers for assistance for urgent and routine care needs. 3. Service will only be billed when office clinical staff spend 20 minutes or more in a month to coordinate care. 4. Only one practitioner may furnish and bill the service in a calendar month. 5. The patient may stop CCM services at any time (effective at the end of the month) by phone call to the office staff. 6. The patient will be responsible for cost sharing (co-pay) of up to 20% of the service fee (after annual deductible is met).  Patient agreed to services and verbal consent obtained.   Review of patient status, including review of consultants reports, laboratory and other test data, was performed as part of comprehensive evaluation and provision of chronic care management services.   Objective: Lab Results  Component Value Date   CREATININE 1.77 (H) 06/19/2016   CREATININE 1.53 (H) 05/03/2016   CREATININE 1.12 (H) 10/20/2015    Lab Results  Component Value Date   HGBA1C 5.8 02/17/2015    Lipid Panel     Component Value Date/Time   CHOL 187 10/20/2015 1941   TRIG 149 10/20/2015 1941   HDL 72 10/20/2015 1941   CHOLHDL 2.6 10/20/2015 1941   LDLCALC 85 10/20/2015 1941    BP Readings from Last 3 Encounters:  08/15/18 130/78  02/23/17 92/64  06/20/16 135/80    No Known Allergies  Medications Reviewed Today    Reviewed by [name], NP  (Nurse Practitioner) on 08/15/18 at 1500  Med List Status: <None>  Medication Order Taking? Sig Documenting Provider Last Dose Status Informant  albuterol (VENTOLIN HFA) 108 (90 Base) MCG/ACT inhaler [id number]  INHALE 2 PUFFS EVERY FOUR TO SIX HOURS AS NEEDED FOR SHORTNESS OF BREATH. Kristen Mills T, NP  Active   amLODipine (NORVASC) 5 MG tablet [id number]  Take 1 tablet (5 mg total) by mouth daily. Kristen Mills T, NP  Active   cetirizine (ZYRTEC) 10 MG tablet [id number]  TAKE ONE TABLET BY MOUTH EVERY DAY. REPLACES LORATADINE. Kristen Mills T, NP  Active   fluticasone (VERAMYST) 27.5 MCG/SPRAY nasal spray [id number]  Place 2 sprays into the nose daily. Kristen Mills T, NP  Active   Fluticasone-Salmeterol (ADVAIR) 250-50 MCG/DOSE AEPB [id number] Yes Inhale 1 puff into the lungs 2 (two) times daily. Kristen Mills T, NP  Active   gabapentin (NEURONTIN) 300 MG capsule [id number] Yes Take 300 mg by mouth 3 (three) times daily. [provider]  Active   hydrOXYzine (ATARAX/VISTARIL) 10 MG tablet [id number]  Take 1 tablet (10 mg total) by mouth 2 (two) times daily as needed. Kristen Mills T, NP  Active   traZODone (DESYREL) 50 MG tablet [id number]  Take 0.5 tablets (25 mg total) by mouth at bedtime. [name], NP  Active          Assessment:   Goals Addressed            This Visit's Progress     Patient  Stated   . "I can't afford my inhalers" (pt-stated)       Current Barriers:  . Financial Barriers- patient notes that she has a difficult time affording Advair and albuterol HFA o Smoker; 1/2-1 ppd; notes she is not interested in cessation at this time o Reports having partial Medicare Extra Help/Low Income Subsidy o Denies having spent $600 on prescription copayments this year that would qualify her for [employer] patient assistance; but she would be able to apply to [employer] assistance for [employer]  Pharmacist Clinical Goal(s):  Marland Kitchen Over the next 30 days, patient will  work with PCP and PharmD to address needs related to medication access  Interventions: . Explained process for patient assistance. Completed Scientist, clinical (histocompatibility and immunogenetics) for [employer] and Proventil together in office. Mailing to [employer] today.   Patient Self Care Activities:  . Self administers medications as prescribed . Calls provider office for new concerns or questions  Initial goal documentation        Plan: - Will follow up with [employer] in 7-10 business days.   [name], PharmD Clinical Pharmacist [place] [phone]

## 2018-08-15 NOTE — Patient Instructions (Signed)
Amlodipine tablets  What is this medicine?  AMLODIPINE (am LOE di peen) is a calcium-channel blocker. It affects the amount of calcium found in your heart and muscle cells. This relaxes your blood vessels, which can reduce the amount of work the heart has to do. This medicine is used to lower high blood pressure. It is also used to prevent chest pain.  This medicine may be used for other purposes; ask your health care provider or pharmacist if you have questions.  COMMON BRAND NAME(S): Norvasc  What should I tell my health care provider before I take this medicine?  They need to know if you have any of these conditions:  -heart disease  -liver disease  -an unusual or allergic reaction to amlodipine, other medicines, foods, dyes, or preservatives  -pregnant or trying to get pregnant  -breast-feeding  How should I use this medicine?  Take this medicine by mouth with a glass of water. Follow the directions on the prescription label. You can take it with or without food. If it upsets your stomach, take it with food. Take your medicine at regular intervals. Do not take it more often than directed. Do not stop taking except on your doctor's advice.  Talk to your pediatrician regarding the use of this medicine in children. While this drug may be prescribed for children as young as 6 years for selected conditions, precautions do apply.  Patients over 65 years of age may have a stronger reaction and need a smaller dose.  Overdosage: If you think you have taken too much of this medicine contact a poison control center or emergency room at once.  NOTE: This medicine is only for you. Do not share this medicine with others.  What if I miss a dose?  If you miss a dose, take it as soon as you can. If it is almost time for your next dose, take only that dose. Do not take double or extra doses.  What may interact with this medicine?  Do not take this medicine with any of the following medications:  -tranylcypromine  This medicine  may also interact with the following medications:  -clarithromycin  -cyclosporine  -diltiazem  -itraconazole  -simvastatin  -tacrolimus  This list may not describe all possible interactions. Give your health care provider a list of all the medicines, herbs, non-prescription drugs, or dietary supplements you use. Also tell them if you smoke, drink alcohol, or use illegal drugs. Some items may interact with your medicine.  What should I watch for while using this medicine?  Visit your healthcare professional for regular checks on your progress. Check your blood pressure as directed. Ask your healthcare professional what your blood pressure should be and when you should contact him or her.  Do not treat yourself for coughs, colds, or pain while you are using this medicine without asking your healthcare professional for advice. Some medicines may increase your blood pressure.  You may get dizzy. Do not drive, use machinery, or do anything that needs mental alertness until you know how this medicine affects you. Do not stand or sit up quickly, especially if you are an older patient. This reduces the risk of dizzy or fainting spells. Avoid alcoholic drinks; they can make you dizzier.  What side effects may I notice from receiving this medicine?  Side effects that you should report to your doctor or health care professional as soon as possible:  -allergic reactions like skin rash, itching or hives; swelling of the face,   lips, or tongue  -fast, irregular heartbeat  -signs and symptoms of low blood pressure like dizziness; feeling faint or lightheaded, falls; unusually weak or tired  -swelling of ankles, feet, hands  Side effects that usually do not require medical attention (report these to your doctor or health care professional if they continue or are bothersome):  -dry mouth  -facial flushing  -headache  -stomach pain  -tiredness  This list may not describe all possible side effects. Call your doctor for medical advice  about side effects. You may report side effects to FDA at 1-800-FDA-1088.  Where should I keep my medicine?  Keep out of the reach of children.  Store at room temperature between 59 and 86 degrees F (15 and 30 degrees C).  Throw away any unused medicine after the expiration date.  NOTE: This sheet is a summary. It may not cover all possible information. If you have questions about this medicine, talk to your doctor, pharmacist, or health care provider.  © 2019 [employer]/Gold Standard (2017-10-06 15:07:10)

## 2018-08-15 NOTE — Progress Notes (Signed)
New Patient Office Visit  Subjective:  Patient ID: Kristen Mills, female    DOB: October 14, 1950  Age: 68 y.o. MRN: [id number]  CC:  Chief Complaint  Patient presents with  . Establish Care  . Medication Refill    advair    HPI Kristen Mills presents for new patient visit to establish care.  Introduced to Designer, jewellery role and practice setting.  All questions answered.  Previously was followed by [facility].    COPD AND NICOTINE DEPENDENCE: Smokes 1 pack a day, reports she tries to hide a pack sometimes so she will only smoke 1/2 PPD at times.  Not interested in quiting at this time, states "you have to be interested to quit".  Discussed at length with her the progressive nature of COPD and the benefit of smoking cessation for disease process. COPD status: stable Satisfied with current treatment?: yes , although difficult to afford inhalers Oxygen use: no Dyspnea frequency: lately has had increased, as was not taking inhalers Cough frequency: none Rescue inhaler frequency: not lately used as did not have inhaler Limitation of activity: no Productive cough:  Last Spirometry:  Pneumovax: Had this past year at [facility] Influenza: Up to Date   HYPERTENSION / HYPERLIPIDEMIA Satisfied with current treatment? yes Duration of hypertension: chronic BP monitoring frequency: not checking BP range:  BP medication side effects: no Past BP meds: Lisinopril Duration of hyperlipidemia: chronic Cholesterol medication side effects: no Cholesterol supplements: none Past cholesterol medications: Fenofibrate Medication compliance: good compliance Aspirin: no Recent stressors: no Recurrent headaches: no Visual changes: no Palpitations: no Dyspnea: no Chest pain: no Lower extremity edema: no Dizzy/lightheaded: no   DEPRESSION Previously on Nortriptyline, has not taken in some time.  Reports several life stressors with family and minimal support system. Mood status:  uncontrolled Satisfied with current treatment?: no current medications, was on Nortiptyline Symptom severity: moderate  Duration of current treatment : chronic Psychotherapy/counseling: none Previous psychiatric medications: Zoloft and Notriptyline Depressed mood: yes Anxious mood: yes Anhedonia: no Significant weight loss or gain: no Insomnia: yes hard to fall asleep Fatigue: no Feelings of worthlessness or guilt: yes Impaired concentration/indecisiveness: yes Suicidal ideations: no Hopelessness: no Crying spells: yes Depression screen PHQ 2/9 08/15/2018  Decreased Interest 2  Down, Depressed, Hopeless 2  PHQ - 2 Score 4  Altered sleeping 3  Tired, decreased energy 3  Change in appetite 1  Feeling bad or failure about yourself  2  Trouble concentrating 1  Moving slowly or fidgety/restless 2  Suicidal thoughts 0  PHQ-9 Score 16  Difficult doing work/chores Somewhat difficult   GAD 7 : Generalized Anxiety Score 08/15/2018  Nervous, Anxious, on Edge 2  Control/stop worrying 3  Worry too much - different things 2  Trouble relaxing 2  Restless 1  Easily annoyed or irritable 0  Afraid - awful might happen 0  Total GAD 7 Score 10  Anxiety Difficulty Somewhat difficult   COCAINE DEPENDENCE: Has history of positive cocaine use in 2018.  She does endorse continued use, reports she does this for energy and pain control with spinal stenosis when she is off her mood medications.  Last used yesterday.  Uses cocaine three times a week, $20 per use.  Started in late 30's, quit for 7 years.  Also uses marijuana to help her sleep on occasion and to help with chronic pain.  SPINAL STENOSIS (LUMBAR): Chronic, ongoing.  Takes Gabapentin 300 MG TID, which she reports helps her pain. Relief  with NSAIDs?: no Nighttime pain:  no Paresthesias / decreased sensation:  no Bowel / bladder incontinence:  no Fevers:  no Dysuria / urinary frequency:  no   Chronic Hepatitis C: Denies past IV drug  use.  States she was initially tested years ago, but at [facility] they repeated "testing and told me it was normal and I did not need treatment".  Reports she has history of alcohol use, but now "only has a drink or two a week".  Last HCV testing on review of chart was in 2017, RNA testing <15.  Antibody testing positive in July 2017.  Denies abdominal pain, N&V, fatigue.  PREDIABETES: Last A1C in 2016 was 5.8.  No current medications.  Denies polyuria, polyphagia, polydipsia.     Past Medical History:  Diagnosis Date  . Asthma   . Cancer [facility])    Uterine Cancer  . COPD (chronic obstructive pulmonary disease) ([place])   . Depression   . Diabetes mellitus without complication ([place])   . Hyperlipidemia   . Hypertension   . Insomnia   . Polysubstance abuse ([place])   . Spinal stenosis     Past Surgical History:  Procedure Laterality Date  . ABDOMINAL HYSTERECTOMY      Family History  Problem Relation Age of Onset  . Cancer Mother        Lung  . Cancer Father        Lung  . Heart disease Sister   . Heart attack Sister   . Cancer Brother        Lung  . Bipolar disorder Son     Social History   Socioeconomic History  . Marital status: Single    Spouse name: Not on file  . Number of children: Not on file  . Years of education: Not on file  . Highest education level: Not on file  Occupational History  . Not on file  Social Needs  . Financial resource strain: Not on file  . Food insecurity:    Worry: Not on file    Inability: Not on file  . Transportation needs:    Medical: Not on file    Non-medical: Not on file  Tobacco Use  . Smoking status: Current Every Day Smoker    Packs/day: 1.00    Years: 45.00    Pack years: 45.00    Types: Cigarettes  . Smokeless tobacco: Never Used  Substance and Sexual Activity  . Alcohol use: Yes    Alcohol/week: 3.0 standard drinks    Types: 3 Cans of beer per week    Comment: Few beers daily.  . Drug use: Yes    Types:  "Crack" cocaine, Marijuana    Comment: some current use  . Sexual activity: Not Currently  Lifestyle  . Physical activity:    Days per week: Not on file    Minutes per session: Not on file  . Stress: Not on file  Relationships  . Social connections:    Talks on phone: Not on file    Gets together: Not on file    Attends religious service: Not on file    Active member of club or organization: Not on file    Attends meetings of clubs or organizations: Not on file    Relationship status: Not on file  . Intimate partner violence:    Fear of current or ex partner: Not on file    Emotionally abused: Not on file    Physically abused:  Not on file    Forced sexual activity: Not on file  Other Topics Concern  . Not on file  Social History Narrative  . Not on file    ROS Review of Systems  Constitutional: Negative for activity change, appetite change, diaphoresis, fatigue and fever.  Respiratory: Negative for cough, chest tightness and shortness of breath.   Cardiovascular: Negative for chest pain, palpitations and leg swelling.  Gastrointestinal: Negative for abdominal distention, abdominal pain, constipation, diarrhea, nausea and vomiting.  Endocrine: Negative for cold intolerance, heat intolerance, polydipsia, polyphagia and polyuria.  Neurological: Positive for numbness (neuropathy legs with back stenosis). Negative for dizziness, syncope, weakness, light-headedness and headaches.  Psychiatric/Behavioral: Positive for decreased concentration and sleep disturbance. Negative for self-injury and suicidal ideas. The patient is nervous/anxious.     Objective:   Today's Vitals: BP 130/78 (BP Location: Left Arm, Patient Position: Sitting)   Pulse 94   Temp 98 F (36.7 C) (Oral)   Ht 5' (1.524 m)   Wt 138 lb (62.6 kg)   SpO2 94%   BMI 26.95 kg/m   Physical Exam Vitals signs and nursing note reviewed.  Constitutional:      General: She is awake. She is not in acute distress.     Appearance: She is well-developed and overweight. She is not ill-appearing.  HENT:     Head: Normocephalic.     Right Ear: Hearing normal.     Left Ear: Hearing normal.  Eyes:     General: Lids are normal.        Right eye: No discharge.        Left eye: No discharge.     Conjunctiva/sclera: Conjunctivae normal.     Pupils: Pupils are equal, round, and reactive to light.  Neck:     Musculoskeletal: Normal range of motion and neck supple.     Thyroid: No thyromegaly.  Cardiovascular:     Rate and Rhythm: Normal rate and regular rhythm.     Heart sounds: Normal heart sounds. No murmur. No gallop.   Pulmonary:     Effort: Pulmonary effort is normal.     Breath sounds: Normal breath sounds.  Abdominal:     General: Bowel sounds are normal.     Palpations: Abdomen is soft. There is no hepatomegaly or splenomegaly.  Musculoskeletal:     Right lower leg: No edema.     Left lower leg: No edema.  Lymphadenopathy:     Cervical: No cervical adenopathy.  Skin:    General: Skin is warm and dry.  Neurological:     Mental Status: She is alert and oriented to person, place, and time.  Psychiatric:        Attention and Perception: Attention normal.        Mood and Affect: Mood normal.        Behavior: Behavior normal. Behavior is cooperative.        Thought Content: Thought content normal.        Judgment: Judgment normal.     Assessment & Plan:   Problem List Items Addressed This Visit      Cardiovascular and Mediastinum   Hypertension    Chronic, ongoing.  Initial BP elevated with repeat improved on manual check.  Previously on Lisinopril, due to COPD will not restart this.  Script for Amlodipine sent and have recommended patient check BP 3 times a week.  Return in 4 weeks for physical and labs.      Relevant Medications  amLODipine (NORVASC) 5 MG tablet     Respiratory   Simple chronic bronchitis (HCC) - Primary    Chronic, ongoing.  Restart Advair and Albuterol, scripts  sent.  CCM referral to determine assistance for inhalers, due to patient financial difficulties.  Recommend complete smoking cessation.  Spirometry next visit.  Return in 4 weeks.      Relevant Orders   Ambulatory referral to Chronic Care Management Services     Digestive   Chronic hepatitis C without hepatic coma (HCC)    Repeat Hep C antibody at next visit and consider GI referral for further work-up.  Recommended no ETOH use.        Nervous and Auditory   Nicotine dependence, cigarettes, w unsp disorders    I have recommended complete cessation of tobacco use. I have discussed various options available for assistance with tobacco cessation including over the counter methods (Nicotine gum, patch and lozenges). We also discussed prescription options (Chantix, Nicotine Inhaler / Nasal Spray). The patient is not interested in pursuing any prescription tobacco cessation options at this time.        Other   Insomnia    Chronic, ongoing.  Script for Trazodone sent, which may benefit mood and sleep pattern.  Adjust dose as needed.  Discussed sleep hygiene techniques.  Return in 4 weeks for f/u and physical exam.      Spinal stenosis    Chronic, continues Gabapentin 300 MG TID and adjust dose as needed based on kidney function.  Avoid opioids due to her current substance abuse.  Refills on Gabapentin sent.      Prediabetes    Recheck A1C next visit during physical.      Cocaine abuse ([place])    Have recommended complete cessation of cocaine use.  She refuses rehab center at this time.  Discussed at length effects of cocaine on overall health, including BP and COPD, plus risk for death with continued use.      Depression, major, recurrent, moderate (HCC)    Chronic, ongoing.  Start Trazodone which may benefit both mood and insomnia, script sent to start at 25 MG and increase to 50 MG if no effect in next two weeks.  Script for Hydroxyzine 10 MG BID PRN sent, for short period use only while  initiating new mood maintenance medication.  Avoid benzo use due to substance abuse at this time.  Return in 4 weeks for follow-up and physical.        Relevant Medications   traZODone (DESYREL) 50 MG tablet   hydrOXYzine (ATARAX/VISTARIL) 10 MG tablet      Outpatient Encounter Medications as of 08/15/2018  Medication Sig  . Fluticasone-Salmeterol (ADVAIR) 250-50 MCG/DOSE AEPB Inhale 1 puff into the lungs 2 (two) times daily.  Marland Kitchen gabapentin (NEURONTIN) 300 MG capsule Take 300 mg by mouth 3 (three) times daily.  . [DISCONTINUED] Fluticasone-Salmeterol (ADVAIR) 250-50 MCG/DOSE AEPB Inhale 1 puff into the lungs 2 (two) times daily.  Marland Kitchen albuterol (VENTOLIN HFA) 108 (90 Base) MCG/ACT inhaler INHALE 2 PUFFS EVERY FOUR TO SIX HOURS AS NEEDED FOR SHORTNESS OF BREATH.  Marland Kitchen amLODipine (NORVASC) 5 MG tablet Take 1 tablet (5 mg total) by mouth daily.  . cetirizine (ZYRTEC) 10 MG tablet TAKE ONE TABLET BY MOUTH EVERY DAY. REPLACES LORATADINE.  . fluticasone (VERAMYST) 27.5 MCG/SPRAY nasal spray Place 2 sprays into the nose daily.  . hydrOXYzine (ATARAX/VISTARIL) 10 MG tablet Take 1 tablet (10 mg total) by mouth 2 (two) times daily as needed.  Marland Kitchen  traZODone (DESYREL) 50 MG tablet Take 0.5 tablets (25 mg total) by mouth at bedtime.  . [DISCONTINUED] cetirizine (ZYRTEC) 10 MG tablet TAKE ONE TABLET BY MOUTH EVERY DAY. REPLACES LORATADINE.  . [DISCONTINUED] fenofibrate (TRICOR) 145 MG tablet Take 1 tablet by mouth daily.  . [DISCONTINUED] fluticasone (FLONASE) 50 MCG/ACT nasal spray Place 2 sprays into both nostrils daily.  . [DISCONTINUED] fluticasone (VERAMYST) 27.5 MCG/SPRAY nasal spray Place 2 sprays into the nose daily.  . [DISCONTINUED] Fluticasone-Salmeterol (ADVAIR DISKUS) 250-50 MCG/DOSE AEPB Inhale 1 puff into the lungs 2 (two) times daily.   . [DISCONTINUED] gabapentin (NEURONTIN) 300 MG capsule TAKE 3 CAPSULES BY MOUTH 3 TIMES A DAY.  . [DISCONTINUED] nortriptyline (PAMELOR) 25 MG capsule Take 1  capsule (25 mg total) by mouth daily.  . [DISCONTINUED] VENTOLIN HFA 108 (90 Base) MCG/ACT inhaler INHALE 2 PUFFS EVERY FOUR TO SIX HOURS AS NEEDED FOR SHORTNESS OF BREATH.   No facility-administered encounter medications on file as of 08/15/2018.     Follow-up: Return in about 4 weeks (around 09/12/2018) for Physical (needs spirometry and labs).   [name], NP

## 2018-08-15 NOTE — Assessment & Plan Note (Signed)
Chronic, ongoing.  Start Trazodone which may benefit both mood and insomnia, script sent to start at 25 MG and increase to 50 MG if no effect in next two weeks.  Script for Hydroxyzine 10 MG BID PRN sent, for short period use only while initiating new mood maintenance medication.  Avoid benzo use due to substance abuse at this time.  Return in 4 weeks for follow-up and physical.

## 2018-08-15 NOTE — Assessment & Plan Note (Signed)
Chronic, continues Gabapentin 300 MG TID and adjust dose as needed based on kidney function.  Avoid opioids due to her current substance abuse.  Refills on Gabapentin sent.

## 2018-08-15 NOTE — Assessment & Plan Note (Addendum)
Repeat Hep C antibody at next visit and consider GI referral for further work-up.  Recommended no ETOH use.

## 2018-08-15 NOTE — Telephone Encounter (Signed)
Copied from [place] [id number]. Topic: Quick Communication - Rx Refill/Question >> Aug 15, 2018  5:04 PM [name] A wrote: Medication: gabapentin (NEURONTIN) 300 MG capsule [[id number]]  Has the patient contacted their pharmacy? No. (Agent: If no, request that the patient contact the pharmacy for the refill.) (Agent: If yes, when and what did the pharmacy advise?)  Preferred Pharmacy (with phone number or street name): [place], [place] - [address] [phone] (Phone)   Agent: Please be advised that RX refills may take up to 3 business days. We ask that you follow-up with your pharmacy.

## 2018-08-15 NOTE — Assessment & Plan Note (Signed)
Chronic, ongoing.  Restart Advair and Albuterol, scripts sent.  CCM referral to determine assistance for inhalers, due to patient financial difficulties.  Recommend complete smoking cessation.  Spirometry next visit.  Return in 4 weeks.

## 2018-08-15 NOTE — Patient Instructions (Signed)
Visit Information  Goals Addressed            This Visit's Progress     Patient Stated   . "I can't afford my inhalers" (pt-stated)       Current Barriers:  . Financial Barriers- patient notes that she has a difficult time affording Advair and albuterol HFA o Smoker; 1/2-1 ppd; notes she is not interested in cessation at this time o Reports having partial Medicare Extra Help/Low Income Subsidy o Denies having spent $600 on prescription copayments this year that would qualify her for [place] patient assistance; but she would be able to apply to [employer] assistance for [employer]  Pharmacist Clinical Goal(s):  Marland Kitchen Over the next 30 days, patient will work with PCP and PharmD to address needs related to medication access  Interventions: . Explained process for patient assistance. Completed Scientist, clinical (histocompatibility and immunogenetics) for [employer] and Proventil together in office. Mailing to [employer] today.   Patient Self Care Activities:  . Self administers medications as prescribed . Calls provider office for new concerns or questions  Initial goal documentation        Ms. Mcgovern was given information about Chronic Care Management services today including:  1. CCM service includes personalized support from designated clinical staff supervised by her physician, including individualized plan of care and coordination with other care providers 2. 24/7 contact phone numbers for assistance for urgent and routine care needs. 3. Service will only be billed when office clinical staff spend 20 minutes or more in a month to coordinate care. 4. Only one practitioner may furnish and bill the service in a calendar month. 5. The patient may stop CCM services at any time (effective at the end of the month) by phone call to the office staff. 6. The patient will be responsible for cost sharing (co-pay) of up to 20% of the service fee (after annual deductible is met).  Patient agreed to services and verbal consent obtained.   The  patient verbalized understanding of instructions provided today and declined a print copy of patient instruction materials.   Plan: - Will follow up with [employer] in 7-10 business days.   [name], PharmD Clinical Pharmacist [place] [phone]

## 2018-08-15 NOTE — Assessment & Plan Note (Signed)
I have recommended complete cessation of tobacco use. I have discussed various options available for assistance with tobacco cessation including over the counter methods (Nicotine gum, patch and lozenges). We also discussed prescription options (Chantix, Nicotine Inhaler / Nasal Spray). The patient is not interested in pursuing any prescription tobacco cessation options at this time.  

## 2018-08-15 NOTE — Assessment & Plan Note (Signed)
Have recommended complete cessation of cocaine use.  She refuses rehab center at this time.  Discussed at length effects of cocaine on overall health, including BP and COPD, plus risk for death with continued use.

## 2018-08-15 NOTE — Assessment & Plan Note (Signed)
Chronic, ongoing.  Script for Trazodone sent, which may benefit mood and sleep pattern.  Adjust dose as needed.  Discussed sleep hygiene techniques.  Return in 4 weeks for f/u and physical exam.

## 2018-08-16 ENCOUNTER — Other Ambulatory Visit: Payer: Self-pay | Admitting: Nurse Practitioner

## 2018-08-16 MED ORDER — GABAPENTIN 300 MG PO CAPS: 300.0000 mg | ORAL_CAPSULE | Freq: Three times a day (TID) | ORAL | 6 refills | Status: DC

## 2018-08-16 NOTE — Telephone Encounter (Signed)
Called pt and advised that medicine has been sent in. Pt understood.

## 2018-08-16 NOTE — Telephone Encounter (Signed)
Sent in

## 2018-08-31 ENCOUNTER — Ambulatory Visit: Payer: Self-pay | Admitting: Pharmacist

## 2018-08-31 DIAGNOSIS — J410 Simple chronic bronchitis: Secondary | ICD-10-CM

## 2018-08-31 NOTE — Patient Instructions (Signed)
Visit Information  Goals Addressed            This Visit's Progress     Patient Stated   . "I can't afford my inhalers" (pt-stated)       Current Barriers:  . Financial Barriers- patient notes that she has a difficult time affording Advair and albuterol HFA o Smoker; 1/2-1 ppd; notes she is not interested in cessation at this time o Reports having partial Medicare Extra Help/Low Income Subsidy o Denies having spent $600 on prescription copayments this year that would qualify her for [place] patient assistance o Control and instrumentation engineer for [employer] patient assistance for [employer] and Proventil  Pharmacist Clinical Goal(s):  Marland Kitchen Over the next 30 days, patient will work with PCP and PharmD to address needs related to medication access  Interventions: . Contacted [employer]; patient's application was received earlier this week, and the Attestation Letter was mailed to her on 08/29/2018. Attempted to contact her to advise her to be on the lookout for this, and that she would need to sign this letter and mail everything back to [employer] to receive her medications, but was unable to reach her or leave any voicemails on listed numbers, even her emergency contact.   Patient Self Care Activities:  . Self administers medications as prescribed . Calls provider office for new concerns or questions  Please see past updates related to this goal by clicking on the "Past Updates" button in the selected goal         The patient verbalized understanding of instructions provided today and declined a print copy of patient instruction materials.   Plan:  - Will attempt outreach to the patient again next week.   [name], PharmD Clinical Pharmacist [place] [phone]

## 2018-08-31 NOTE — Chronic Care Management (AMB) (Signed)
  Chronic Care Management   Follow Up Note   08/31/2018 Name: Kristen Mills MRN: [id number] DOB: 12-08-1950  Referred by: [name], NP Reason for referral : Chronic Care Management (Medication Access)   Kristen Mills is a 68 y.o. year old female who is a primary care patient of Cannady, Barbaraann Faster, NP. The CCM team was consulted for assistance with chronic disease management and care coordination needs.    Care coordination completed today.   Review of patient status, including review of consultants reports, relevant laboratory and other test results, and collaboration with appropriate care team members and the patient's provider was performed as part of comprehensive patient evaluation and provision of chronic care management services.    Goals Addressed            This Visit's Progress     Patient Stated   . "I can't afford my inhalers" (pt-stated)       Current Barriers:  . Financial Barriers- patient notes that she has a difficult time affording Advair and albuterol HFA o Smoker; 1/2-1 ppd; notes she is not interested in cessation at this time o Reports having partial Medicare Extra Help/Low Income Subsidy o Denies having spent $600 on prescription copayments this year that would qualify her for [place] patient assistance o Control and instrumentation engineer for [employer] patient assistance for [employer] and Proventil  Pharmacist Clinical Goal(s):  Marland Kitchen Over the next 30 days, patient will work with PCP and PharmD to address needs related to medication access  Interventions: . Contacted [employer]; patient's application was received earlier this week, and the Attestation Letter was mailed to her on 08/29/2018. Attempted to contact her to advise her to be on the lookout for this, and that she would need to sign this letter and mail everything back to [employer] to receive her medications, but was unable to reach her or leave any voicemails on listed numbers, even her emergency contact.   Patient Self Care Activities:  .  Self administers medications as prescribed . Calls provider office for new concerns or questions  Please see past updates related to this goal by clicking on the "Past Updates" button in the selected goal          Plan:  - Will attempt outreach to the patient again next week.   [name], PharmD Clinical Pharmacist [place] [phone]

## 2018-09-07 ENCOUNTER — Ambulatory Visit: Payer: Self-pay | Admitting: Pharmacist

## 2018-09-07 ENCOUNTER — Telehealth: Payer: Self-pay

## 2018-09-07 DIAGNOSIS — J410 Simple chronic bronchitis: Secondary | ICD-10-CM

## 2018-09-07 NOTE — Chronic Care Management (AMB) (Signed)
  Chronic Care Management   Follow Up Note   09/07/2018 Name: Kristen Mills MRN: [id number] DOB: 05-02-1950  Referred by: [name], NP Reason for referral : Chronic Care Management (Medication Management)   Kristen Mills is a 68 y.o. year old female who is a primary care patient of Cannady, Barbaraann Faster, NP. The CCM team was consulted for assistance with chronic disease management and care coordination needs.    Contacted patient today to follow up on status of patient assistance application for Dulera and Proventil from [employer].   Review of patient status, including review of consultants reports, relevant laboratory and other test results, and collaboration with appropriate care team members and the patient's provider was performed as part of comprehensive patient evaluation and provision of chronic care management services.    Goals Addressed            This Visit's Progress     Patient Stated   . "I can't afford my inhalers" (pt-stated)       Current Barriers:  . Financial Barriers- patient notes that she has a difficult time affording Advair and albuterol HFA o Smoker; 1/2-1 ppd; notes she is not interested in cessation at this time o Reports having partial Medicare Extra Help/Low Income Subsidy o Applied for [employer] patient assistance through [employer]. Patient notes that she received the Attestation Letter in the mail earlier this week, and filled it out and mailed back today  Pharmacist Clinical Goal(s):  Marland Kitchen Over the next 30 days, patient will work with PCP and PharmD to address needs related to medication access  Interventions: . Let patient know that I will follow up with [employer] next week on this application process . Reminded of appointment with Marnee Guarneri, NP next Friday.   Patient Self Care Activities:  . Self administers medications as prescribed . Calls provider office for new concerns or questions  Please see past updates related to this goal by  clicking on the "Past Updates" button in the selected goal          Plan:  - PharmD will contact [employer] in 7-10 business days regarding patient's application  [name], PharmD Clinical Pharmacist [place] [phone]

## 2018-09-07 NOTE — Patient Instructions (Signed)
Visit Information  Goals Addressed            This Visit's Progress     Patient Stated   . "I can't afford my inhalers" (pt-stated)       Current Barriers:  . Financial Barriers- patient notes that she has a difficult time affording Advair and albuterol HFA o Smoker; 1/2-1 ppd; notes she is not interested in cessation at this time o Reports having partial Medicare Extra Help/Low Income Subsidy o Applied for [employer] patient assistance through [employer]. Patient notes that she received the Attestation Letter in the mail earlier this week, and filled it out and mailed back today  Pharmacist Clinical Goal(s):  Marland Kitchen Over the next 30 days, patient will work with PCP and PharmD to address needs related to medication access  Interventions: . Let patient know that I will follow up with [employer] next week on this application process . Reminded of appointment with Marnee Guarneri, NP next Friday.   Patient Self Care Activities:  . Self administers medications as prescribed . Calls provider office for new concerns or questions  Please see past updates related to this goal by clicking on the "Past Updates" button in the selected goal         The patient verbalized understanding of instructions provided today and declined a print copy of patient instruction materials.    Plan:  - PharmD will contact [employer] in 7-10 business days regarding patient's application  [name], PharmD Clinical Pharmacist [place] [phone]

## 2018-09-14 ENCOUNTER — Encounter: Payer: PPO | Admitting: Nurse Practitioner

## 2018-09-18 ENCOUNTER — Ambulatory Visit (INDEPENDENT_AMBULATORY_CARE_PROVIDER_SITE_OTHER): Payer: PPO | Admitting: Pharmacist

## 2018-09-18 DIAGNOSIS — J410 Simple chronic bronchitis: Secondary | ICD-10-CM | POA: Diagnosis not present

## 2018-09-18 NOTE — Patient Instructions (Signed)
Visit Information  Goals Addressed            This Visit's Progress     Patient Stated   . "I can't afford my inhalers" (pt-stated)       Current Barriers:  . Financial Barriers- patient notes that she has a difficult time affording Advair and albuterol HFA o Smoker; 1/2-1 ppd; notes she is not interested in cessation at this time o Reports having partial Medicare Extra Help/Low Income Subsidy o Applied for [employer] patient assistance through [employer]. Patient notes that she received the Attestation Letter in the mail earlier this week, and filled it out and mailed back today  Pharmacist Clinical Goal(s):  Marland Kitchen Over the next 30 days, patient will work with PCP and PharmD to address needs related to medication access  Interventions: . Contacted [employer] - patient was APPROVED 09/12/2018-03/28/2019; shipped 6/23, tracking [id number].   Patient Self Care Activities:  . Self administers medications as prescribed . Calls provider office for new concerns or questions  Please see past updates related to this goal by clicking on the "Past Updates" button in the selected goal         The patient verbalized understanding of instructions provided today and declined a print copy of patient instruction materials.    Plan:  - Will follow up with patient in 1-3 weeks to ensure shipping   [name], PharmD Clinical Pharmacist [place] [phone]

## 2018-09-18 NOTE — Chronic Care Management (AMB) (Signed)
  Chronic Care Management   Follow Up Note   09/18/2018 Name: Kristen Mills MRN: [id number] DOB: Sep 14, 1950  Referred by: [name], NP Reason for referral : Chronic Care Management (Medication Manage)   Kristen Mills is a 68 y.o. year old female who is a primary care patient of Cannady, Barbaraann Faster, NP. The CCM team was consulted for assistance with chronic disease management and care coordination needs.    Care coordination completed today.   Review of patient status, including review of consultants reports, relevant laboratory and other test results, and collaboration with appropriate care team members and the patient's provider was performed as part of comprehensive patient evaluation and provision of chronic care management services.    Goals Addressed            This Visit's Progress     Patient Stated   . "I can't afford my inhalers" (pt-stated)       Current Barriers:  . Financial Barriers- patient notes that she has a difficult time affording Advair and albuterol HFA o Smoker; 1/2-1 ppd; notes she is not interested in cessation at this time o Reports having partial Medicare Extra Help/Low Income Subsidy o Applied for [employer] patient assistance through [employer]. Patient notes that she received the Attestation Letter in the mail earlier this week, and filled it out and mailed back today  Pharmacist Clinical Goal(s):  Marland Kitchen Over the next 30 days, patient will work with PCP and PharmD to address needs related to medication access  Interventions: . Contacted [employer] - patient was APPROVED 09/12/2018-03/28/2019; shipped 6/23, tracking [id number].   Patient Self Care Activities:  . Self administers medications as prescribed . Calls provider office for new concerns or questions  Please see past updates related to this goal by clicking on the "Past Updates" button in the selected goal          Plan:  - Will follow up with patient in 1-3 weeks to  ensure shipping   [name], PharmD Clinical Pharmacist [place] [phone]

## 2018-09-19 ENCOUNTER — Telehealth: Payer: Self-pay

## 2018-10-03 ENCOUNTER — Ambulatory Visit (INDEPENDENT_AMBULATORY_CARE_PROVIDER_SITE_OTHER): Payer: PPO | Admitting: Pharmacist

## 2018-10-03 ENCOUNTER — Encounter: Payer: PPO | Admitting: Nurse Practitioner

## 2018-10-03 DIAGNOSIS — J410 Simple chronic bronchitis: Secondary | ICD-10-CM | POA: Diagnosis not present

## 2018-10-03 NOTE — Chronic Care Management (AMB) (Signed)
Chronic Care Management   Follow Up Note   10/03/2018 Name: Kristen Mills MRN: [id number] DOB: [birth year]  Referred by: [name], NP Reason for referral : Chronic Care Management (Medication Management )   Kristen Mills is a 68 y.o. year old female who is a primary care patient of Cannady, Barbaraann Faster, NP. The CCM team was consulted for assistance with chronic disease management and care coordination needs.    Contacted patient today to follow up on status of receiving medications from [employer] patient assistance.   Review of patient status, including review of consultants reports, relevant laboratory and other test results, and collaboration with appropriate care team members and the patient's provider was performed as part of comprehensive patient evaluation and provision of chronic care management services.    Goals Addressed            This Visit's Progress     Patient Stated   . COMPLETED: "I can't afford my inhalers" (pt-stated)       Current Barriers:  . Financial Barriers- patient notes that she has a difficult time affording Advair and albuterol HFA o Smoker; 1/2-1 ppd; notes she is not interested in cessation at this time o Reports having partial Medicare Extra Help/Low Income Subsidy o APPROVED 09/12/2018-03/28/2019; shipped 09/18/2018, per [employer] was delivered 09/26/2018  Pharmacist Clinical Goal(s):  Marland Kitchen Over the next 30 days, patient will work with PCP and PharmD to address needs related to medication access  Interventions: . Spoke with patient, she confirmed that she received the shipment from [employer]. She is incredibly thankful for our support and help. Counseled on how to request refills; she noted that her phone can't dial out well, so she will call me when she is due for refills, and I will call [employer] on her behalf.   Patient Self Care Activities:  . Self administers medications as prescribed . Calls provider office for new concerns or questions  Please see past  updates related to this goal by clicking on the "Past Updates" button in the selected goal      . "I'm having a hard time sleeping" (pt-stated)       Current Barriers:  . Pt w/ dx insomnia, depression, substance abuse. Primary care provider initiated trazodone 25 mg once daily, but patient notes that she didn't see the "1/2 tablet" and took 1 tablet QHS for a while. When she realized the error, she has been taking 1/2 tablet, but notes that it doesn't help her sleep at all. She requests that we consider increasing to 50 mg QHS. Denies any next morning grogginess when she was taking 50 mg .  . She also notes psychosocial concerns regard her husband and her substance abuse.  . Notes her neighbor helped sign her up with the food pantry   Pharmacist Clinical Goal(s):  Marland Kitchen Over the next 90 days, patient will work with PharmD and primary care provider to address needs related to optimized medication management  Interventions: . Offered to send referral to Eula Fried, LCSW to contact patient to discuss her worries about her son. Patient accepted this recommendation. Messaged Brooke.  . Patient has follow up with primary care provider next week - recommend increasing trazodone to 50 mg QHS at that time.   Patient Self Care Activities:  . Self administers medications as prescribed . Calls pharmacy for medication refills . Calls provider office for new concerns or questions  Initial goal documentation  Plan: - Will follow up with patient regarding medication management in 3-4 weeks  [name], PharmD Clinical Pharmacist [place] [phone]

## 2018-10-03 NOTE — Patient Instructions (Signed)
Visit Information  Goals Addressed            This Visit's Progress     Patient Stated   . COMPLETED: "I can't afford my inhalers" (pt-stated)       Current Barriers:  . Financial Barriers- patient notes that she has a difficult time affording Advair and albuterol HFA o Smoker; 1/2-1 ppd; notes she is not interested in cessation at this time o Reports having partial Medicare Extra Help/Low Income Subsidy o APPROVED 09/12/2018-03/28/2019; shipped 09/18/2018, per [employer] was delivered 09/26/2018  Pharmacist Clinical Goal(s):  Marland Kitchen Over the next 30 days, patient will work with PCP and PharmD to address needs related to medication access  Interventions: . Spoke with patient, she confirmed that she received the shipment from [employer]. She is incredibly thankful for our support and help. Counseled on how to request refills; she noted that her phone can't dial out well, so she will call me when she is due for refills, and I will call [employer] on her behalf.   Patient Self Care Activities:  . Self administers medications as prescribed . Calls provider office for new concerns or questions  Please see past updates related to this goal by clicking on the "Past Updates" button in the selected goal      . "I'm having a hard time sleeping" (pt-stated)       Current Barriers:  . Pt w/ dx insomnia, depression, substance abuse. Primary care provider initiated trazodone 25 mg once daily, but patient notes that she didn't see the "1/2 tablet" and took 1 tablet QHS for a while. When she realized the error, she has been taking 1/2 tablet, but notes that it doesn't help her sleep at all. She requests that we consider increasing to 50 mg QHS. Denies any next morning grogginess when she was taking 50 mg .  . She also notes psychosocial concerns regard her husband and her substance abuse.  . Notes her neighbor helped sign her up with the food pantry   Pharmacist Clinical Goal(s):  Marland Kitchen Over the next 90 days, patient will  work with PharmD and primary care provider to address needs related to optimized medication management  Interventions: . Offered to send referral to Eula Fried, LCSW to contact patient to discuss her worries about her son. Patient accepted this recommendation. Messaged Brooke.  . Patient has follow up with primary care provider next week - recommend increasing trazodone to 50 mg QHS at that time.   Patient Self Care Activities:  . Self administers medications as prescribed . Calls pharmacy for medication refills . Calls provider office for new concerns or questions  Initial goal documentation        The patient verbalized understanding of instructions provided today and declined a print copy of patient instruction materials.   Plan: - Will follow up with patient regarding medication management in 3-4 weeks  [name], PharmD Clinical Pharmacist [place] [phone]

## 2018-10-05 ENCOUNTER — Telehealth: Payer: Self-pay

## 2018-10-10 ENCOUNTER — Encounter: Payer: PPO | Admitting: Nurse Practitioner

## 2018-10-26 ENCOUNTER — Ambulatory Visit: Payer: Self-pay | Admitting: Licensed Clinical Social Worker

## 2018-10-26 ENCOUNTER — Telehealth: Payer: Self-pay

## 2018-10-26 NOTE — Chronic Care Management (AMB) (Signed)
  Chronic Care Management    Clinical Social Work Follow Up Note  10/26/2018 Name: EILISH MCDANIEL MRN: [id number] DOB: 07/30/1950  KETTY BITTON is a 68 y.o. year old female who is a primary care patient of Cannady, Barbaraann Faster, NP. The CCM team was consulted for assistance with [employer] and [employer] [employer].   Review of patient status, including review of consultants reports, other relevant assessments, and collaboration with appropriate care team members and the patient's provider was performed as part of comprehensive patient evaluation and provision of chronic care management services.     LCSW completed CCM outreach attempt today but was unable to reach patient successfully. A HIPPA compliant voice message was left encouraging patient to return call once available. LCSW rescheduled CCM SW appointment as well.  Follow Up Plan: SW will follow up with patient by phone over the next 30 days  Eula Fried, [place], MSW, [email or web address] Phone: [phone]

## 2018-11-12 ENCOUNTER — Ambulatory Visit (INDEPENDENT_AMBULATORY_CARE_PROVIDER_SITE_OTHER): Payer: PPO | Admitting: Nurse Practitioner

## 2018-11-12 ENCOUNTER — Other Ambulatory Visit: Payer: Self-pay

## 2018-11-12 ENCOUNTER — Encounter: Payer: Self-pay | Admitting: Nurse Practitioner

## 2018-11-12 VITALS — BP 132/84 | HR 94 | Temp 98.6°F | Ht 60.0 in | Wt 138.8 lb

## 2018-11-12 DIAGNOSIS — E782 Mixed hyperlipidemia: Secondary | ICD-10-CM | POA: Diagnosis not present

## 2018-11-12 DIAGNOSIS — R7303 Prediabetes: Secondary | ICD-10-CM | POA: Diagnosis not present

## 2018-11-12 DIAGNOSIS — B182 Chronic viral hepatitis C: Secondary | ICD-10-CM | POA: Diagnosis not present

## 2018-11-12 DIAGNOSIS — F17219 Nicotine dependence, cigarettes, with unspecified nicotine-induced disorders: Secondary | ICD-10-CM | POA: Diagnosis not present

## 2018-11-12 DIAGNOSIS — Z780 Asymptomatic menopausal state: Secondary | ICD-10-CM | POA: Diagnosis not present

## 2018-11-12 DIAGNOSIS — F5104 Psychophysiologic insomnia: Secondary | ICD-10-CM | POA: Diagnosis not present

## 2018-11-12 DIAGNOSIS — F1410 Cocaine abuse, uncomplicated: Secondary | ICD-10-CM | POA: Diagnosis not present

## 2018-11-12 DIAGNOSIS — Z1239 Encounter for other screening for malignant neoplasm of breast: Secondary | ICD-10-CM

## 2018-11-12 DIAGNOSIS — F331 Major depressive disorder, recurrent, moderate: Secondary | ICD-10-CM | POA: Diagnosis not present

## 2018-11-12 DIAGNOSIS — J410 Simple chronic bronchitis: Secondary | ICD-10-CM | POA: Diagnosis not present

## 2018-11-12 DIAGNOSIS — Z0000 Encounter for general adult medical examination without abnormal findings: Secondary | ICD-10-CM

## 2018-11-12 DIAGNOSIS — I10 Essential (primary) hypertension: Secondary | ICD-10-CM | POA: Diagnosis not present

## 2018-11-12 MED ORDER — HYDROXYZINE HCL 10 MG PO TABS: 10.0000 mg | ORAL_TABLET | Freq: Two times a day (BID) | ORAL | 2 refills | Status: DC | PRN

## 2018-11-12 MED ORDER — ALBUTEROL SULFATE (2.5 MG/3ML) 0.083% IN NEBU
2.5000 mg | INHALATION_SOLUTION | Freq: Once | RESPIRATORY_TRACT | Status: AC
  Administered 2018-11-12: 2.5 mg via RESPIRATORY_TRACT

## 2018-11-12 MED ORDER — MONTELUKAST SODIUM 10 MG PO TABS: 10.0000 mg | ORAL_TABLET | Freq: Every day | ORAL | 3 refills | Status: DC

## 2018-11-12 MED ORDER — TRAZODONE HCL 50 MG PO TABS: 50.0000 mg | ORAL_TABLET | Freq: Every day | ORAL | 3 refills | Status: DC

## 2018-11-12 NOTE — Assessment & Plan Note (Signed)
Chronic, stable with BP at goal today.  Continue current medication regimen and adjust as needed.  Recommend checking BP at home 3 mornings a week and documenting for provider.  Discontinued Lisinopril last visit and switched to Amlodipine, which she is tolerating better with her COPD.

## 2018-11-12 NOTE — Progress Notes (Signed)
BP 132/84    Pulse 94    Temp 98.6 F (37 C) (Oral)    Ht 5' (1.524 m)    Wt 138 lb 12.8 oz (63 kg)    SpO2 96%    BMI 27.11 kg/m    Subjective:    Patient ID: Kristen Mills, female    DOB: [birth year], 68 y.o.   MRN: [id number]  HPI: Kristen Mills is a 68 y.o. female presenting on 11/12/2018 for comprehensive medical examination. Current medical complaints include:none  She currently lives with: self Menopausal Symptoms: no   COPD Switched to [facility] at initial visit and she reports a drastic improvement from Advair. Continues to smoke, about 1/2 PPD.  Takes Zyrtec, but reports this does not help.  Used to take Allegra, but it only helped for short period.   COPD status: stable Satisfied with current treatment?: yes Oxygen use: no Dyspnea frequency: a few times a day, short periods when doing vigorous activity Cough frequency: improved with Dulera, one a day or so Rescue inhaler frequency: sometimes once a day Limitation of activity: no Productive cough: none Last Spirometry: August 2020 Pneumovax: Up to Date Influenza: Up to Date   PREDIABETES: A1C in 2016 5.8%.  No current medications. Hypoglycemic episodes:no Polydipsia/polyuria: no Visual disturbance: no Chest pain: no Paresthesias: no   HYPERTENSION / HYPERLIPIDEMIA Continues on Amlodipine daily.  No current statin, underlying chronic Hep C and was told not to take.  Works on diet.   Satisfied with current treatment? yes Duration of hypertension: chronic BP monitoring frequency: not checking BP range:  BP medication side effects: no Duration of hyperlipidemia: chronic Cholesterol supplements: none Aspirin: no Recent stressors: no Recurrent headaches: no Visual changes: no Palpitations: no Dyspnea: no Chest pain: no Lower extremity edema: no Dizzy/lightheaded: no   DEPRESSION & INSOMNIA Continues on Trazodone.  She reports improvement in mood and sleep pattern with Trazodone and PRN Vistaril.  She does  have underlying cocaine abuse, using weekly.  She is aware of the harm this could cause, including it could possibly lead to death with underlying HTN.  Endorses "I know this is an issue", but refuses rehab or therapy. Mood status: controlled Satisfied with current treatment?: yes Symptom severity: mild  Duration of current treatment : chronic Side effects: no Medication compliance: good compliance Psychotherapy/counseling: none Previous psychiatric medications: Trazodone Depressed mood: no Anxious mood: yes Anhedonia: no Significant weight loss or gain: no Insomnia: improved with Trazodone Fatigue: no Feelings of worthlessness or guilt: no Impaired concentration/indecisiveness: no Suicidal ideations: no Hopelessness: no Crying spells: no  Depression Screen done today and results listed below:  Depression screen [facility] 2/9 11/12/2018 08/15/2018  Decreased Interest 0 2  Down, Depressed, Hopeless 0 2  PHQ - 2 Score 0 4  Altered sleeping 2 3  Tired, decreased energy 3 3  Change in appetite 0 1  Feeling bad or failure about yourself  0 2  Trouble concentrating 0 1  Moving slowly or fidgety/restless 0 2  Suicidal thoughts 0 0  PHQ-9 Score 5 16  Difficult doing work/chores - Somewhat difficult    The patient does not have a history of falls. I did not complete a risk assessment for falls. A plan of care for falls was not documented.   Past Medical History:  Past Medical History:  Diagnosis Date   Asthma    Cancer ([place])    Uterine Cancer   COPD (chronic obstructive pulmonary disease) ([place])    Depression  Diabetes mellitus without complication ([place])    Hyperlipidemia    Hypertension    Insomnia    Polysubstance abuse ([place])    Spinal stenosis     Surgical History:  Past Surgical History:  Procedure Laterality Date   ABDOMINAL HYSTERECTOMY      Medications:  Current Outpatient Medications on File Prior to Visit  Medication Sig   albuterol (VENTOLIN HFA)  108 (90 Base) MCG/ACT inhaler INHALE 2 PUFFS EVERY FOUR TO SIX HOURS AS NEEDED FOR SHORTNESS OF BREATH.   amLODipine (NORVASC) 5 MG tablet Take 1 tablet (5 mg total) by mouth daily.   gabapentin (NEURONTIN) 300 MG capsule Take 1 capsule (300 mg total) by mouth 3 (three) times daily.   mometasone-formoterol (DULERA) 200-5 MCG/ACT AERO Inhale 2 puffs into the lungs 2 (two) times daily.   No current facility-administered medications on file prior to visit.     Allergies:  No Known Allergies  Social History:  Social History   Socioeconomic History   Marital status: Single    Spouse name: Not on file   Number of children: Not on file   Years of education: Not on file   Highest education level: Not on file  Occupational History   Not on file  Social Needs   Financial resource strain: Not on file   Food insecurity    Worry: Not on file    Inability: Not on file   Transportation needs    Medical: Not on file    Non-medical: Not on file  Tobacco Use   Smoking status: Current Every Day Smoker    Packs/day: 1.00    Years: 45.00    Pack years: 45.00    Types: Cigarettes   Smokeless tobacco: Never Used  Substance and Sexual Activity   Alcohol use: Yes    Alcohol/week: 3.0 standard drinks    Types: 3 Cans of beer per week    Comment: Few beers daily.   Drug use: Yes    Types: "Crack" cocaine, Marijuana    Comment: some current use   Sexual activity: Not Currently  Lifestyle   Physical activity    Days per week: Not on file    Minutes per session: Not on file   Stress: Not on file  Relationships   Social connections    Talks on phone: Not on file    Gets together: Not on file    Attends religious service: Not on file    Active member of club or organization: Not on file    Attends meetings of clubs or organizations: Not on file    Relationship status: Not on file   Intimate partner violence    Fear of current or ex partner: Not on file    Emotionally  abused: Not on file    Physically abused: Not on file    Forced sexual activity: Not on file  Other Topics Concern   Not on file  Social History Narrative   Not on file   Social History   Tobacco Use  Smoking Status Current Every Day Smoker   Packs/day: 1.00   Years: 45.00   Pack years: 45.00   Types: Cigarettes  Smokeless Tobacco Never Used   Social History   Substance and Sexual Activity  Alcohol Use Yes   Alcohol/week: 3.0 standard drinks   Types: 3 Cans of beer per week   Comment: Few beers daily.    Family History:  Family History  Problem Relation Age of  Onset   Cancer Mother        Lung   Cancer Father        Lung   Heart disease Sister    Heart attack Sister    Cancer Brother        Lung   Bipolar disorder Son     Past medical history, surgical history, medications, allergies, family history and social history reviewed with patient today and changes made to appropriate areas of the chart.   Review of Systems - negative All other ROS negative except what is listed above and in the HPI.      Objective:    BP 132/84    Pulse 94    Temp 98.6 F (37 C) (Oral)    Ht 5' (1.524 m)    Wt 138 lb 12.8 oz (63 kg)    SpO2 96%    BMI 27.11 kg/m   Wt Readings from Last 3 Encounters:  11/12/18 138 lb 12.8 oz (63 kg)  08/15/18 138 lb (62.6 kg)  02/23/17 136 lb 14.4 oz (62.1 kg)    Physical Exam Vitals signs and nursing note reviewed.  Constitutional:      General: She is awake. She is not in acute distress.    Appearance: She is well-developed. She is not ill-appearing.  HENT:     Head: Normocephalic.     Right Ear: Hearing, tympanic membrane, ear canal and external ear normal. No drainage.     Left Ear: Hearing, tympanic membrane, ear canal and external ear normal. No drainage.     Nose: Nose normal.     Mouth/Throat:     Mouth: Mucous membranes are moist.  Eyes:     General: Lids are normal.        Right eye: No discharge.        Left  eye: No discharge.     Extraocular Movements: Extraocular movements intact.     Conjunctiva/sclera: Conjunctivae normal.     Pupils: Pupils are equal, round, and reactive to light.     Visual Fields: Right eye visual fields normal and left eye visual fields normal.  Neck:     Musculoskeletal: Normal range of motion and neck supple.     Thyroid: No thyromegaly.     Vascular: No carotid bruit.  Cardiovascular:     Rate and Rhythm: Normal rate and regular rhythm.     Heart sounds: Normal heart sounds. No murmur. No gallop.   Pulmonary:     Effort: Pulmonary effort is normal. No accessory muscle usage or respiratory distress.     Breath sounds: Normal breath sounds.  Chest:     Breasts:        Right: Normal. No swelling, bleeding, inverted nipple, mass, nipple discharge, skin change or tenderness.        Left: Normal. No swelling, bleeding, inverted nipple, mass, nipple discharge, skin change or tenderness.  Abdominal:     General: Bowel sounds are normal.     Palpations: Abdomen is soft. There is no hepatomegaly or splenomegaly.     Tenderness: There is no abdominal tenderness.  Musculoskeletal:     Right lower leg: No edema.     Left lower leg: No edema.  Lymphadenopathy:     Cervical: No cervical adenopathy.     Upper Body:     Right upper body: No supraclavicular, axillary or pectoral adenopathy.     Left upper body: No supraclavicular, axillary or pectoral adenopathy.  Skin:  General: Skin is warm and dry.     Findings: No rash.  Neurological:     Mental Status: She is alert and oriented to person, place, and time.     Cranial Nerves: Cranial nerves are intact.     Gait: Gait is intact.     Deep Tendon Reflexes: Reflexes are normal and symmetric.     Reflex Scores:      Brachioradialis reflexes are 2+ on the right side and 2+ on the left side.      Patellar reflexes are 2+ on the right side and 2+ on the left side. Psychiatric:        Attention and Perception: Attention  normal.        Mood and Affect: Mood normal.        Behavior: Behavior normal. Behavior is cooperative.        Thought Content: Thought content normal.        Judgment: Judgment normal.    Diabetic Foot Exam - Simple   Simple Foot Form Visual Inspection No deformities, no ulcerations, no other skin breakdown bilaterally: Yes Sensation Testing Intact to touch and monofilament testing bilaterally: Yes Pulse Check Posterior Tibialis and Dorsalis pulse intact bilaterally: Yes Comments     Results for orders placed or performed during the hospital encounter of 06/19/16  CBC  Result Value Ref Range   WBC 10.0 3.6 - 11.0 K/uL   RBC 4.36 3.80 - 5.20 MIL/uL   Hemoglobin 13.5 12.0 - 16.0 g/dL   HCT 39.8 35.0 - 47.0 %   MCV 91.4 80.0 - 100.0 fL   MCH 31.0 26.0 - 34.0 pg   MCHC 33.9 32.0 - 36.0 g/dL   RDW 13.8 11.5 - 14.5 %   Platelets 293 150 - 440 K/uL  Comprehensive metabolic panel  Result Value Ref Range   Sodium 138 135 - 145 mmol/L   Potassium 4.5 3.5 - 5.1 mmol/L   Chloride 101 101 - 111 mmol/L   CO2 30 22 - 32 mmol/L   Glucose, Bld 144 (H) 65 - 99 mg/dL   BUN 35 (H) 6 - 20 mg/dL   Creatinine, Ser 1.77 (H) 0.44 - 1.00 mg/dL   Calcium 9.5 8.9 - 10.3 mg/dL   Total Protein 7.7 6.5 - 8.1 g/dL   Albumin 4.1 3.5 - 5.0 g/dL   AST 93 (H) 15 - 41 U/L   ALT 87 (H) 14 - 54 U/L   Alkaline Phosphatase 78 38 - 126 U/L   Total Bilirubin 0.5 0.3 - 1.2 mg/dL   GFR calc non Af Amer 29 (L) >60 mL/min   GFR calc Af Amer 34 (L) >60 mL/min   Anion gap 7 5 - 15  Ethanol  Result Value Ref Range   Alcohol, Ethyl (B) <5 <5 mg/dL  Urine Drug Screen, Qualitative ([facility] only)  Result Value Ref Range   Tricyclic, Ur Screen POSITIVE (A) NONE DETECTED   Amphetamines, Ur Screen NONE DETECTED NONE DETECTED   MDMA (Ecstasy)Ur Screen POSITIVE (A) NONE DETECTED   Cocaine Metabolite,Ur [place] POSITIVE (A) NONE DETECTED   Opiate, Ur Screen NONE DETECTED NONE DETECTED   Phencyclidine (PCP) Ur S NONE  DETECTED NONE DETECTED   Cannabinoid 50 Ng, Ur [place] NONE DETECTED NONE DETECTED   Barbiturates, Ur Screen NONE DETECTED NONE DETECTED   Benzodiazepine, Ur Scrn NONE DETECTED NONE DETECTED   Methadone Scn, Ur NONE DETECTED NONE DETECTED  Urinalysis, Routine w reflex microscopic  Result Value Ref Range  Color, Urine YELLOW (A) YELLOW   APPearance CLEAR (A) CLEAR   Specific Gravity, Urine 1.015 1.005 - 1.030   pH 6.0 5.0 - 8.0   Glucose, UA 150 (A) NEGATIVE mg/dL   Hgb urine dipstick NEGATIVE NEGATIVE   Bilirubin Urine NEGATIVE NEGATIVE   Ketones, ur NEGATIVE NEGATIVE mg/dL   Protein, ur NEGATIVE NEGATIVE mg/dL   Nitrite NEGATIVE NEGATIVE   Leukocytes, UA NEGATIVE NEGATIVE  Acetaminophen level  Result Value Ref Range   Acetaminophen (Tylenol), Serum <10 (L) 10 - 30 ug/mL  Salicylate level  Result Value Ref Range   Salicylate Lvl <2.5 2.8 - 30.0 mg/dL      Assessment & Plan:   Problem List Items Addressed This Visit      Cardiovascular and Mediastinum   Hypertension    Chronic, stable with BP at goal today.  Continue current medication regimen and adjust as needed.  Recommend checking BP at home 3 mornings a week and documenting for provider.  Discontinued Lisinopril last visit and switched to Amlodipine, which she is tolerating better with her COPD.           Respiratory   Simple chronic bronchitis (HCC) - Primary    Chronic, improved at this time with [facility].  Continue current medication regimen and add on Singulair for AR aspect of COPD.  Spirometry noting FEV1 64% and FVC 78% pre.  Recommend complete smoking cessation, she is not interested in medication to assist.  Labs today.      Relevant Orders   Spirometry with Graph (Completed)   CBC with Differential/Platelet     Digestive   Chronic hepatitis C without hepatic coma (HCC)    Recommend no ETOH or APAP use.  CMP today.  GI referral as needed.      Relevant Orders   Comprehensive metabolic panel   VITAMIN D 25  Hydroxy (Vit-D Deficiency, Fractures)     Nervous and Auditory   Nicotine dependence, cigarettes, w unsp disorders    I have recommended complete cessation of tobacco use. I have discussed various options available for assistance with tobacco cessation including over the counter methods (Nicotine gum, patch and lozenges). We also discussed prescription options (Chantix, Nicotine Inhaler / Nasal Spray). The patient is not interested in pursuing any prescription tobacco cessation options at this time.  Discussed lung CT, she wishes to think about it and not order until next visit.         Other   Hyperlipemia    Chronic, ongoing.  No current medications.  Check lipid panel today.      Relevant Orders   Comprehensive metabolic panel   Lipid Panel w/o Chol/HDL Ratio   Insomnia    Improved on Trazodone.  Continue current medication regimen, refills sent.        Prediabetes    Check A1C today      Relevant Orders   HgB A1c   Cocaine abuse ([place])    Continue to recommend complete cessation of cocaine use due to risk factors for health.  She refuses rehab or therapy at this time.        Depression, major, recurrent, moderate (HCC)    Chronic, stable.  Denies SI/HI and reports improved mood with Trazodone.  Continue current medication regimen, refills sent.  Avoid benzo use due to substance abuse.  Return in 3 months.      Relevant Medications   hydrOXYzine (ATARAX/VISTARIL) 10 MG tablet   traZODone (DESYREL) 50 MG tablet  Other Visit Diagnoses    Encounter for annual physical exam       Relevant Orders   TSH   Postmenopausal estrogen deficiency       Relevant Orders   DG Bone Density   Breast cancer screening       Relevant Orders   MM DIGITAL SCREENING BILATERAL       Follow up plan: Return in about 3 months (around 02/12/2019) for copd, htn/hld.   LABORATORY TESTING:  - Pap smear: up to date  IMMUNIZATIONS:   - Tdap: Tetanus vaccination status reviewed: last  tetanus booster within 10 years. - Influenza: Up to date - Pneumovax: Up to date - Prevnar: Up to date - HPV: Not applicable - Zostavax vaccine: Refused  SCREENING: -Mammogram: Ordered today  - Colonoscopy: Refused  - Bone Density: Ordered today  -Hearing Test: Not applicable  -Spirometry: Ordered today   PATIENT COUNSELING:   Advised to take 1 mg of folate supplement per day if capable of pregnancy.   Sexuality: Discussed sexually transmitted diseases, partner selection, use of condoms, avoidance of unintended pregnancy  and contraceptive alternatives.   Advised to avoid cigarette smoking.  I discussed with the patient that most people either abstain from alcohol or drink within safe limits (<=14/week and <=4 drinks/occasion for males, <=7/weeks and <= 3 drinks/occasion for females) and that the risk for alcohol disorders and other health effects rises proportionally with the number of drinks per week and how often a drinker exceeds daily limits.  Discussed cessation/primary prevention of drug use and availability of treatment for abuse.   Diet: Encouraged to adjust caloric intake to maintain  or achieve ideal body weight, to reduce intake of dietary saturated fat and total fat, to limit sodium intake by avoiding high sodium foods and not adding table salt, and to maintain adequate dietary potassium and calcium preferably from fresh fruits, vegetables, and low-fat dairy products.    stressed the importance of regular exercise  Injury prevention: Discussed safety belts, safety helmets, smoke detector, smoking near bedding or upholstery.   Dental health: Discussed importance of regular tooth brushing, flossing, and dental visits.    NEXT PREVENTATIVE PHYSICAL DUE IN 1 YEAR. Return in about 3 months (around 02/12/2019) for copd, htn/hld.

## 2018-11-12 NOTE — Assessment & Plan Note (Addendum)
Chronic, improved at this time with [facility].  Continue current medication regimen and add on Singulair for AR aspect of COPD.  Spirometry noting FEV1 64% and FVC 78% pre.  Recommend complete smoking cessation, she is not interested in medication to assist.  Labs today.

## 2018-11-12 NOTE — Assessment & Plan Note (Signed)
Continue to recommend complete cessation of cocaine use due to risk factors for health.  She refuses rehab or therapy at this time.

## 2018-11-12 NOTE — Assessment & Plan Note (Signed)
Check A1C today

## 2018-11-12 NOTE — Assessment & Plan Note (Signed)
Chronic, stable.  Denies SI/HI and reports improved mood with Trazodone.  Continue current medication regimen, refills sent.  Avoid benzo use due to substance abuse.  Return in 3 months.

## 2018-11-12 NOTE — Assessment & Plan Note (Signed)
Chronic, ongoing.  No current medications.  Check lipid panel today.

## 2018-11-12 NOTE — Patient Instructions (Signed)
[facility]  Address: [address], [place], [place] [address]  Phone: [phone]  ASK IF INSURANCE COVERS BONE DENSITY, MAMMOGRAM, AND FOR COLOGUARD (FOR COLON CANCER SCREENING).   COPD and Physical Activity Chronic obstructive pulmonary disease (COPD) is a long-term (chronic) condition that affects the lungs. COPD is a general term that can be used to describe many different lung problems that cause lung swelling (inflammation) and limit airflow, including chronic bronchitis and emphysema. The main symptom of COPD is shortness of breath, which makes it harder to do even simple tasks. This can also make it harder to exercise and be active. Talk with your health care provider about treatments to help you breathe better and actions you can take to prevent breathing problems during physical activity. What are the benefits of exercising with COPD? Exercising regularly is an important part of a healthy lifestyle. You can still exercise and do physical activities even though you have COPD. Exercise and physical activity improve your shortness of breath by increasing blood flow (circulation). This causes your heart to pump more oxygen through your body. Moderate exercise can improve your:  Oxygen use.  Energy level.  Shortness of breath.  Strength in your breathing muscles.  Heart health.  Sleep.  Self-esteem and feelings of self-worth.  Depression, stress, and anxiety levels. Exercise can benefit everyone with COPD. The severity of your disease may affect how hard you can exercise, especially at first, but everyone can benefit. Talk with your health care provider about how much exercise is safe for you, and which activities and exercises are safe for you. What actions can I take to prevent breathing problems during physical activity?  Sign up for a pulmonary rehabilitation program. This type of program may include: ? Education about lung diseases.  ? Exercise classes that teach you how to exercise and be more active while improving your breathing. This usually involves:  Exercise using your lower extremities, such as a stationary bicycle.  About 30 minutes of exercise, 2 to 5 times per week, for 6 to 12 weeks  Strength training, such as push ups or leg lifts. ? Nutrition education. ? Group classes in which you can talk with others who also have COPD and learn ways to manage stress.  If you use an oxygen tank, you should use it while you exercise. Work with your health care provider to adjust your oxygen for your physical activity. Your resting flow rate is different from your flow rate during physical activity.  While you are exercising: ? Take slow breaths. ? Pace yourself and do not try to go too fast. ? Purse your lips while breathing out. Pursing your lips is similar to a kissing or whistling position. ? If doing exercise that uses a quick burst of effort, such as weight lifting:  Breathe in before starting the exercise.  Breathe out during the hardest part of the exercise (such as raising the weights). Where to find support You can find support for exercising with COPD from:  Your health care provider.  A pulmonary rehabilitation program.  Your local health department or community health programs.  Support groups, online or in-person. Your health care provider may be able to recommend support groups. Where to find more information You can find more information about exercising with COPD from:  American Lung Association: ClassInsider.se.  COPD Foundation: https://www.rivera.net/. Contact a health care provider if:  Your symptoms get worse.  You have chest pain.  You have  nausea.  You have a fever.  You have trouble talking or catching your breath.  You want to start a new exercise program or a new activity. Summary  COPD is a general term that can be used to describe many different lung problems that cause lung  swelling (inflammation) and limit airflow. This includes chronic bronchitis and emphysema.  Exercise and physical activity improve your shortness of breath by increasing blood flow (circulation). This causes your heart to provide more oxygen to your body.  Contact your health care provider before starting any exercise program or new activity. Ask your health care provider what exercises and activities are safe for you. This information is not intended to replace advice given to you by your health care provider. Make sure you discuss any questions you have with your health care provider. Document Released: 04/06/2017 Document Revised: 07/04/2018 Document Reviewed: 04/06/2017 Elsevier Patient Education  2020 [employer].

## 2018-11-12 NOTE — Assessment & Plan Note (Signed)
Recommend no ETOH or APAP use.  CMP today.  GI referral as needed.

## 2018-11-12 NOTE — Assessment & Plan Note (Addendum)
I have recommended complete cessation of tobacco use. I have discussed various options available for assistance with tobacco cessation including over the counter methods (Nicotine gum, patch and lozenges). We also discussed prescription options (Chantix, Nicotine Inhaler / Nasal Spray). The patient is not interested in pursuing any prescription tobacco cessation options at this time.  Discussed lung CT, she wishes to think about it and not order until next visit.

## 2018-11-12 NOTE — Assessment & Plan Note (Signed)
Improved on Trazodone.  Continue current medication regimen, refills sent.

## 2018-11-13 ENCOUNTER — Telehealth: Payer: Self-pay | Admitting: Nurse Practitioner

## 2018-11-13 LAB — HEMOGLOBIN A1C
Est. average glucose Bld gHb Est-mCnc: 123 mg/dL
Hgb A1c MFr Bld: 5.9 % — ABNORMAL HIGH (ref 4.8–5.6)

## 2018-11-13 LAB — LIPID PANEL W/O CHOL/HDL RATIO
Cholesterol, Total: 215 mg/dL — ABNORMAL HIGH (ref 100–199)
HDL: 60 mg/dL (ref >39)
LDL Calculated: 117 mg/dL — ABNORMAL HIGH (ref 0–99)
Triglycerides: 191 mg/dL — ABNORMAL HIGH (ref 0–149)
VLDL Cholesterol Cal: 38 mg/dL (ref 5–40)

## 2018-11-13 LAB — CBC WITH DIFFERENTIAL/PLATELET
Basophils Absolute: 0.1 10*3/uL (ref 0.0–0.2)
Basos: 1 %
EOS (ABSOLUTE): 0.1 10*3/uL (ref 0.0–0.4)
Eos: 1 %
Hematocrit: 44.5 % (ref 34.0–46.6)
Hemoglobin: 15.1 g/dL (ref 11.1–15.9)
Immature Grans (Abs): 0.1 10*3/uL (ref 0.0–0.1)
Immature Granulocytes: 1 %
Lymphocytes Absolute: 3.1 10*3/uL (ref 0.7–3.1)
Lymphs: 25 %
MCH: 30.5 pg (ref 26.6–33.0)
MCHC: 33.9 g/dL (ref 31.5–35.7)
MCV: 90 fL (ref 79–97)
Monocytes Absolute: 0.8 10*3/uL (ref 0.1–0.9)
Monocytes: 7 %
Neutrophils Absolute: 8.2 10*3/uL — ABNORMAL HIGH (ref 1.4–7.0)
Neutrophils: 65 %
Platelets: 312 10*3/uL (ref 150–450)
RBC: 4.95 x10E6/uL (ref 3.77–5.28)
RDW: 12.9 % (ref 11.7–15.4)
WBC: 12.3 10*3/uL — ABNORMAL HIGH (ref 3.4–10.8)

## 2018-11-13 LAB — COMPREHENSIVE METABOLIC PANEL
ALT: 15 IU/L (ref 0–32)
AST: 25 IU/L (ref 0–40)
Albumin/Globulin Ratio: 1.8 (ref 1.2–2.2)
Albumin: 4.4 g/dL (ref 3.8–4.8)
Alkaline Phosphatase: 110 IU/L (ref 39–117)
BUN/Creatinine Ratio: 9 — ABNORMAL LOW (ref 12–28)
BUN: 10 mg/dL (ref 8–27)
Bilirubin Total: 0.3 mg/dL (ref 0.0–1.2)
CO2: 23 mmol/L (ref 20–29)
Calcium: 9.5 mg/dL (ref 8.7–10.3)
Chloride: 100 mmol/L (ref 96–106)
Creatinine, Ser: 1.09 mg/dL — ABNORMAL HIGH (ref 0.57–1.00)
GFR calc Af Amer: 61 mL/min/{1.73_m2} (ref >59)
GFR calc non Af Amer: 53 mL/min/{1.73_m2} — ABNORMAL LOW (ref >59)
Globulin, Total: 2.4 g/dL (ref 1.5–4.5)
Glucose: 131 mg/dL — ABNORMAL HIGH (ref 65–99)
Potassium: 4.2 mmol/L (ref 3.5–5.2)
Sodium: 137 mmol/L (ref 134–144)
Total Protein: 6.8 g/dL (ref 6.0–8.5)

## 2018-11-13 LAB — VITAMIN D 25 HYDROXY (VIT D DEFICIENCY, FRACTURES): Vit D, 25-Hydroxy: 7.2 ng/mL — ABNORMAL LOW (ref 30.0–100.0)

## 2018-11-13 LAB — TSH: TSH: 2.66 u[IU]/mL (ref 0.450–4.500)

## 2018-11-13 MED ORDER — ATORVASTATIN CALCIUM 10 MG PO TABS: 10.0000 mg | ORAL_TABLET | Freq: Every day | ORAL | 1 refills | Status: DC

## 2018-11-13 MED ORDER — CHOLECALCIFEROL 1.25 MG (50000 UT) PO TABS: 1.0000 | ORAL_TABLET | ORAL | 0 refills | Status: DC

## 2018-11-13 NOTE — Telephone Encounter (Signed)
Spoke to patient on phone and reviewed lab results.  Discussed continued prediabetes and focus on diet at home.  Cholesterol levels elevated with ASCVD 27.7%, have recommend starting statin which she is agreeable with.  Educated her on side effects of this.  Will start low dose 10 MG and increase if tolerated.  CBC --- discussed slight elevation in WBC and Neuts, she denies URI or UTI symptoms.  States "only allergies".  Will repeat this at visit in 4 weeks.  Also discussed low vitamin D level, will call in script for weekly Vit D.  She agrees with POC and was able to verbalize back.

## 2018-11-14 ENCOUNTER — Telehealth: Payer: Self-pay

## 2018-11-16 ENCOUNTER — Ambulatory Visit (INDEPENDENT_AMBULATORY_CARE_PROVIDER_SITE_OTHER): Payer: PPO | Admitting: Pharmacist

## 2018-11-16 DIAGNOSIS — J410 Simple chronic bronchitis: Secondary | ICD-10-CM

## 2018-11-16 DIAGNOSIS — F331 Major depressive disorder, recurrent, moderate: Secondary | ICD-10-CM

## 2018-11-16 DIAGNOSIS — E782 Mixed hyperlipidemia: Secondary | ICD-10-CM

## 2018-11-16 NOTE — Patient Instructions (Signed)
Visit Information  Goals Addressed            This Visit's Progress     Patient Stated   . "I'm having a hard time sleeping" (pt-stated)       Current Barriers:  . Pt w/ dx insomnia, depression, substance abuse. Marland Kitchen Recently saw PCP - lab work indicated hyperlipidemia and vitamin D deficiency. Atorvastatin 10 mg daily and vitamin D weekly started . Today, patient confirms that she has these medications and is taking as prescribed . Notes that she didn't notice if she received a call from the LCSW, but would still like to talk to her   Pharmacist Clinical Goal(s):  Marland Kitchen Over the next 90 days, patient will work with PharmD and primary care provider to address needs related to optimized medication management  Interventions: . Counseled on purpose, monitoring of all medications; patient verbalized understanding . Outreach by [employer] scheduled later this month  Patient Self Care Activities:  . Self administers medications as prescribed . Calls pharmacy for medication refills . Calls provider office for new concerns or questions  Please see past updates related to this goal by clicking on the "Past Updates" button in the selected goal         The patient verbalized understanding of instructions provided today and declined a print copy of patient instruction materials.  Plan:  - Will outreach patient in 3-5 weeks for continued medication management support  [name], PharmD Clinical Pharmacist [place] [phone]

## 2018-11-16 NOTE — Chronic Care Management (AMB) (Signed)
  Chronic Care Management   Follow Up Note   11/16/2018 Name: Kristen Mills MRN: [id number] DOB: 05/17/1950  Referred by: [name], NP Reason for referral : Chronic Care Management (Medication Management )   Kristen Mills is a 68 y.o. year old female who is a primary care patient of Cannady, Barbaraann Faster, NP. The CCM team was consulted for assistance with chronic disease management and care coordination needs.    Spoke with patient telephonically to follow up on medication management.  Review of patient status, including review of consultants reports, relevant laboratory and other test results, and collaboration with appropriate care team members and the patient's provider was performed as part of comprehensive patient evaluation and provision of chronic care management services.    SDOH (Social Determinants of Health) screening performed today: Financial Strain . See Care Plan for related entries.   Outpatient Encounter Medications as of 11/16/2018  Medication Sig  . amLODipine (NORVASC) 5 MG tablet Take 1 tablet (5 mg total) by mouth daily.  Marland Kitchen atorvastatin (LIPITOR) 10 MG tablet Take 1 tablet (10 mg total) by mouth daily at 6 PM.  . Cholecalciferol 1.25 MG (50000 UT) TABS Take 1 tablet by mouth once a week. For 8 weeks and then stop.  Return to office for lab draw.  . gabapentin (NEURONTIN) 300 MG capsule Take 1 capsule (300 mg total) by mouth 3 (three) times daily.  . hydrOXYzine (ATARAX/VISTARIL) 10 MG tablet Take 1 tablet (10 mg total) by mouth 2 (two) times daily as needed.  . mometasone-formoterol (DULERA) 200-5 MCG/ACT AERO Inhale 2 puffs into the lungs 2 (two) times daily.  . montelukast (SINGULAIR) 10 MG tablet Take 1 tablet (10 mg total) by mouth at bedtime.  . traZODone (DESYREL) 50 MG tablet Take 1 tablet (50 mg total) by mouth at bedtime.  Marland Kitchen albuterol (VENTOLIN HFA) 108 (90 Base) MCG/ACT inhaler INHALE 2 PUFFS EVERY FOUR TO SIX HOURS AS NEEDED FOR SHORTNESS OF BREATH.    No facility-administered encounter medications on file as of 11/16/2018.      Goals Addressed            This Visit's Progress     Patient Stated   . "I'm having a hard time sleeping" (pt-stated)       Current Barriers:  . Pt w/ dx insomnia, depression, substance abuse. Marland Kitchen Recently saw PCP - lab work indicated hyperlipidemia and vitamin D deficiency. Atorvastatin 10 mg daily and vitamin D weekly started . Today, patient confirms that she has these medications and is taking as prescribed . Notes that she didn't notice if she received a call from the LCSW, but would still like to talk to her   Pharmacist Clinical Goal(s):  Marland Kitchen Over the next 90 days, patient will work with PharmD and primary care provider to address needs related to optimized medication management  Interventions: . Counseled on purpose, monitoring of all medications; patient verbalized understanding . Outreach by [employer] scheduled later this month  Patient Self Care Activities:  . Self administers medications as prescribed . Calls pharmacy for medication refills . Calls provider office for new concerns or questions  Please see past updates related to this goal by clicking on the "Past Updates" button in the selected goal         Plan:  - Will outreach patient in 3-5 weeks for continued medication management support  [name], PharmD Clinical Pharmacist [place] [phone]

## 2018-11-26 ENCOUNTER — Ambulatory Visit: Payer: Self-pay | Admitting: Licensed Clinical Social Worker

## 2018-11-26 ENCOUNTER — Telehealth: Payer: Self-pay

## 2018-11-26 NOTE — Chronic Care Management (AMB) (Signed)
  Care Management   Follow Up Note   11/26/2018 Name: ROXI ERNSTER MRN: [id number] DOB: May 22, 1950  Referred by: [name], NP Reason for referral : Care Coordination   ELLESYN DROKE is a 68 y.o. year old female who is a primary care patient of Cannady, Barbaraann Faster, NP. The care management team was consulted for assistance with care management and care coordination needs.    Review of patient status, including review of consultants reports, relevant laboratory and other test results, and collaboration with appropriate care team members and the patient's provider was performed as part of comprehensive patient evaluation and provision of chronic care management services.    LCSW completed CCM outreach attempt today but was unable to reach patient successfully. A HIPPA compliant voice message was left encouraging patient to return call once available. LCSW rescheduled CCM SW appointment as well.  A HIPPA compliant phone message was left for the patient providing contact information and requesting a return call.   Eula Fried, BSW, MSW, [facility] Care Management [email or web address] Phone: [phone]

## 2018-12-11 ENCOUNTER — Other Ambulatory Visit: Payer: Self-pay

## 2018-12-11 ENCOUNTER — Encounter: Payer: Self-pay | Admitting: Nurse Practitioner

## 2018-12-11 ENCOUNTER — Ambulatory Visit (INDEPENDENT_AMBULATORY_CARE_PROVIDER_SITE_OTHER): Payer: PPO | Admitting: Nurse Practitioner

## 2018-12-11 VITALS — BP 129/75 | HR 81 | Temp 98.2°F

## 2018-12-11 DIAGNOSIS — E559 Vitamin D deficiency, unspecified: Secondary | ICD-10-CM | POA: Insufficient documentation

## 2018-12-11 DIAGNOSIS — E782 Mixed hyperlipidemia: Secondary | ICD-10-CM | POA: Diagnosis not present

## 2018-12-11 DIAGNOSIS — J0100 Acute maxillary sinusitis, unspecified: Secondary | ICD-10-CM | POA: Diagnosis not present

## 2018-12-11 DIAGNOSIS — Z23 Encounter for immunization: Secondary | ICD-10-CM

## 2018-12-11 MED ORDER — HYDROXYZINE HCL 10 MG PO TABS: 10.0000 mg | ORAL_TABLET | Freq: Two times a day (BID) | ORAL | 2 refills | Status: DC | PRN

## 2018-12-11 MED ORDER — AMOXICILLIN-POT CLAVULANATE 875-125 MG PO TABS: 1.0000 | ORAL_TABLET | Freq: Two times a day (BID) | ORAL | 0 refills | Status: AC

## 2018-12-11 NOTE — Assessment & Plan Note (Signed)
Continue supplement and recheck Vit D level today.  May consider switch from weekly to daily dosing if improved. ?

## 2018-12-11 NOTE — Assessment & Plan Note (Signed)
Tolerating addition of Lipitor, will recheck lipid panel and CMP today.  Adjust dose as needed.  Monitor LFT closely with history of Hep C.

## 2018-12-11 NOTE — Assessment & Plan Note (Signed)
Acute with symptoms x 5 days.  Will send script for Augmentin.  Continue Singulair daily, would avoid Flonase or nasal sprays due to cocaine use and patient report of "hole in nose" due to this.  She has been followed by ENT in past per her report.  Recommend neti pot at home for comfort.  Make take Coricidin as needed for symptom relief.  Return to office for continued or worsening symptoms.

## 2018-12-11 NOTE — Progress Notes (Signed)
BP 129/75   Pulse 81   Temp 98.2 F (36.8 C) (Oral)   SpO2 94%    Subjective:    Patient ID: Kristen Mills, female    DOB: [birth year], 68 y.o.   MRN: [id number]  HPI: Kristen Mills is a 68 y.o. female  Chief Complaint  Patient presents with  . Hyperlipidemia    4 week f/up   HYPERLIPIDEMIA Recently started on Lipitor.   Hyperlipidemia status: good compliance Satisfied with current treatment?  yes Side effects:  no Medication compliance: good compliance Past cholesterol meds: Lipitor Supplements: none Aspirin:  no The 10-year ASCVD risk score [name] DC Jr., et al., 2013) is: 26.7%   Values used to calculate the score:     Age: 41 years     Sex: Female     Is Non-Hispanic African American: No     Diabetic: Yes     Tobacco smoker: Yes     Systolic Blood Pressure: 169 mmHg     Is BP treated: Yes     HDL Cholesterol: 60 mg/dL     Total Cholesterol: 215 mg/dL Chest pain:  no Coronary artery disease:  no Family history CAD:  no Family history early CAD:  no   SINUS INFECTION Reports sinus drainage, thick yellow, and eye pressure x 5 days. Fever: no Cough: yes Shortness of breath: no Wheezing: no Chest pain: no Chest tightness: no Chest congestion: no Nasal congestion: yes Runny nose: yes Post nasal drip: yes Sneezing: no Sore throat: no Swollen glands: no Sinus pressure: yes Headache: yes Face pain: yes Toothache: no Ear pain: none Ear pressure: none Eyes red/itching:no Eye drainage/crusting: no  Vomiting: no Rash: no Fatigue: no Sick contacts: no Strep contacts: no  Context: stable Recurrent sinusitis: no Relief with OTC cold/cough medications: no  Treatments attempted: allergy medicine   VITAMIN D DEFICIENCY: Started on supplement recently, level 7.2%.  No recent falls or fractures.  Denies muscle pain of fatigue.  Reports overall feeling better since starting supplement.  Relevant past medical, surgical, family and social history reviewed  and updated as indicated. Interim medical history since our last visit reviewed. Allergies and medications reviewed and updated.  Review of Systems  Constitutional: Negative for activity change, appetite change, diaphoresis, fatigue and fever.  HENT: Positive for congestion, postnasal drip, rhinorrhea, sinus pressure and sinus pain. Negative for ear discharge, ear pain, facial swelling, sneezing, sore throat and voice change.   Eyes: Negative for pain and visual disturbance.  Respiratory: Negative for cough, chest tightness, shortness of breath and wheezing.   Cardiovascular: Negative for chest pain, palpitations and leg swelling.  Gastrointestinal: Negative for abdominal distention, abdominal pain, constipation, diarrhea, nausea and vomiting.  Musculoskeletal: Negative for myalgias.  Neurological: Negative for dizziness, numbness and headaches.  Psychiatric/Behavioral: Negative.     Per HPI unless specifically indicated above     Objective:    BP 129/75   Pulse 81   Temp 98.2 F (36.8 C) (Oral)   SpO2 94%   Wt Readings from Last 3 Encounters:  11/12/18 138 lb 12.8 oz (63 kg)  08/15/18 138 lb (62.6 kg)  02/23/17 136 lb 14.4 oz (62.1 kg)    Physical Exam Vitals signs and nursing note reviewed.  Constitutional:      General: She is awake. She is not in acute distress.    Appearance: She is well-developed and overweight. She is not ill-appearing.  HENT:     Head: Normocephalic.  Right Ear: Hearing, tympanic membrane, ear canal and external ear normal.     Left Ear: Hearing, tympanic membrane, ear canal and external ear normal.     Nose:     Right Sinus: Maxillary sinus tenderness present. No frontal sinus tenderness.     Left Sinus: Maxillary sinus tenderness present. No frontal sinus tenderness.     Mouth/Throat:     Mouth: Mucous membranes are moist.     Pharynx: Oropharynx is clear. Uvula midline. No pharyngeal swelling, oropharyngeal exudate or posterior oropharyngeal  erythema.  Eyes:     General: Lids are normal.        Right eye: No discharge.        Left eye: No discharge.     Conjunctiva/sclera: Conjunctivae normal.     Pupils: Pupils are equal, round, and reactive to light.  Neck:     Musculoskeletal: Normal range of motion and neck supple.     Thyroid: No thyromegaly.     Vascular: No carotid bruit.  Cardiovascular:     Rate and Rhythm: Normal rate and regular rhythm.     Heart sounds: Normal heart sounds. No murmur. No gallop.   Pulmonary:     Effort: Pulmonary effort is normal. No accessory muscle usage or respiratory distress.     Breath sounds: Normal breath sounds.  Abdominal:     General: Bowel sounds are normal.     Palpations: Abdomen is soft.  Musculoskeletal:     Right lower leg: No edema.     Left lower leg: No edema.  Lymphadenopathy:     Cervical: No cervical adenopathy.  Skin:    General: Skin is warm and dry.  Neurological:     Mental Status: She is alert and oriented to person, place, and time.  Psychiatric:        Attention and Perception: Attention normal.        Mood and Affect: Mood normal.        Behavior: Behavior normal. Behavior is cooperative.        Thought Content: Thought content normal.        Judgment: Judgment normal.    Results for orders placed or performed in visit on 11/12/18  CBC with Differential/Platelet  Result Value Ref Range   WBC 12.3 (H) 3.4 - 10.8 x10E3/uL   RBC 4.95 3.77 - 5.28 x10E6/uL   Hemoglobin 15.1 11.1 - 15.9 g/dL   Hematocrit 44.5 34.0 - 46.6 %   MCV 90 79 - 97 fL   MCH 30.5 26.6 - 33.0 pg   MCHC 33.9 31.5 - 35.7 g/dL   RDW 12.9 11.7 - 15.4 %   Platelets 312 150 - 450 x10E3/uL   Neutrophils 65 Not Estab. %   Lymphs 25 Not Estab. %   Monocytes 7 Not Estab. %   Eos 1 Not Estab. %   Basos 1 Not Estab. %   Neutrophils Absolute 8.2 (H) 1.4 - 7.0 x10E3/uL   Lymphocytes Absolute 3.1 0.7 - 3.1 x10E3/uL   Monocytes Absolute 0.8 0.1 - 0.9 x10E3/uL   EOS (ABSOLUTE) 0.1 0.0 -  0.4 x10E3/uL   Basophils Absolute 0.1 0.0 - 0.2 x10E3/uL   Immature Granulocytes 1 Not Estab. %   Immature Grans (Abs) 0.1 0.0 - 0.1 x10E3/uL  Comprehensive metabolic panel  Result Value Ref Range   Glucose 131 (H) 65 - 99 mg/dL   BUN 10 8 - 27 mg/dL   Creatinine, Ser 1.09 (H) 0.57 - 1.00 mg/dL  GFR calc non Af Amer 53 (L) >59 mL/min/1.73   GFR calc Af Amer 61 >59 mL/min/1.73   BUN/Creatinine Ratio 9 (L) 12 - 28   Sodium 137 134 - 144 mmol/L   Potassium 4.2 3.5 - 5.2 mmol/L   Chloride 100 96 - 106 mmol/L   CO2 23 20 - 29 mmol/L   Calcium 9.5 8.7 - 10.3 mg/dL   Total Protein 6.8 6.0 - 8.5 g/dL   Albumin 4.4 3.8 - 4.8 g/dL   Globulin, Total 2.4 1.5 - 4.5 g/dL   Albumin/Globulin Ratio 1.8 1.2 - 2.2   Bilirubin Total 0.3 0.0 - 1.2 mg/dL   Alkaline Phosphatase 110 39 - 117 IU/L   AST 25 0 - 40 IU/L   ALT 15 0 - 32 IU/L  Lipid Panel w/o Chol/HDL Ratio  Result Value Ref Range   Cholesterol, Total 215 (H) 100 - 199 mg/dL   Triglycerides 191 (H) 0 - 149 mg/dL   HDL 60 >39 mg/dL   VLDL Cholesterol Cal 38 5 - 40 mg/dL   LDL Calculated 117 (H) 0 - 99 mg/dL  TSH  Result Value Ref Range   TSH 2.660 0.450 - 4.500 uIU/mL  VITAMIN D 25 Hydroxy (Vit-D Deficiency, Fractures)  Result Value Ref Range   Vit D, 25-Hydroxy 7.2 (L) 30.0 - 100.0 ng/mL  Hemoglobin A1c  Result Value Ref Range   Hgb A1c MFr Bld 5.9 (H) 4.8 - 5.6 %   Est. average glucose Bld gHb Est-mCnc 123 mg/dL      Assessment & Plan:   Problem List Items Addressed This Visit      Respiratory   Maxillary sinusitis, acute    Acute with symptoms x 5 days.  Will send script for Augmentin.  Continue Singulair daily, would avoid Flonase or nasal sprays due to cocaine use and patient report of "hole in nose" due to this.  She has been followed by ENT in past per her report.  Recommend neti pot at home for comfort.  Make take Coricidin as needed for symptom relief.  Return to office for continued or worsening symptoms.       Relevant Medications   amoxicillin-clavulanate (AUGMENTIN) 875-125 MG tablet     Other   Hyperlipemia - Primary    Tolerating addition of Lipitor, will recheck lipid panel and CMP today.  Adjust dose as needed.  Monitor LFT closely with history of Hep C.        Relevant Orders   Lipid Panel w/o Chol/HDL Ratio   Vitamin D deficiency    Continue supplement and recheck Vit D level today.  May consider switch from weekly to daily dosing if improved.      Relevant Orders   VITAMIN D 25 Hydroxy (Vit-D Deficiency, Fractures)   Comp Met (CMET)    Other Visit Diagnoses    Need for influenza vaccination       Relevant Orders   Flu Vaccine QUAD High Dose(Fluad) (Completed)       Follow up plan: Return in about 3 months (around 03/12/2019) for Prediabetes, HTN/HLD. COPD.

## 2018-12-11 NOTE — Patient Instructions (Signed)

## 2018-12-12 LAB — COMPREHENSIVE METABOLIC PANEL
ALT: 18 IU/L (ref 0–32)
AST: 25 IU/L (ref 0–40)
Albumin/Globulin Ratio: 1.9 (ref 1.2–2.2)
Albumin: 4.5 g/dL (ref 3.8–4.8)
Alkaline Phosphatase: 101 IU/L (ref 39–117)
BUN/Creatinine Ratio: 8 — ABNORMAL LOW (ref 12–28)
BUN: 9 mg/dL (ref 8–27)
Bilirubin Total: 0.6 mg/dL (ref 0.0–1.2)
CO2: 25 mmol/L (ref 20–29)
Calcium: 9.5 mg/dL (ref 8.7–10.3)
Chloride: 102 mmol/L (ref 96–106)
Creatinine, Ser: 1.1 mg/dL — ABNORMAL HIGH (ref 0.57–1.00)
GFR calc Af Amer: 60 mL/min/{1.73_m2} (ref >59)
GFR calc non Af Amer: 52 mL/min/{1.73_m2} — ABNORMAL LOW (ref >59)
Globulin, Total: 2.4 g/dL (ref 1.5–4.5)
Glucose: 122 mg/dL — ABNORMAL HIGH (ref 65–99)
Potassium: 4.2 mmol/L (ref 3.5–5.2)
Sodium: 141 mmol/L (ref 134–144)
Total Protein: 6.9 g/dL (ref 6.0–8.5)

## 2018-12-12 LAB — LIPID PANEL W/O CHOL/HDL RATIO
Cholesterol, Total: 139 mg/dL (ref 100–199)
HDL: 67 mg/dL (ref >39)
LDL Chol Calc (NIH): 47 mg/dL (ref 0–99)
Triglycerides: 152 mg/dL — ABNORMAL HIGH (ref 0–149)
VLDL Cholesterol Cal: 25 mg/dL (ref 5–40)

## 2018-12-12 LAB — VITAMIN D 25 HYDROXY (VIT D DEFICIENCY, FRACTURES): Vit D, 25-Hydroxy: 34.4 ng/mL (ref 30.0–100.0)

## 2019-01-08 ENCOUNTER — Ambulatory Visit: Payer: Self-pay | Admitting: Pharmacist

## 2019-01-08 ENCOUNTER — Other Ambulatory Visit: Payer: Self-pay | Admitting: Nurse Practitioner

## 2019-01-08 DIAGNOSIS — E782 Mixed hyperlipidemia: Secondary | ICD-10-CM

## 2019-01-08 DIAGNOSIS — J410 Simple chronic bronchitis: Secondary | ICD-10-CM

## 2019-01-08 MED ORDER — ATORVASTATIN CALCIUM 10 MG PO TABS: 10.0000 mg | ORAL_TABLET | Freq: Every day | ORAL | 3 refills | Status: DC

## 2019-01-08 NOTE — Patient Instructions (Addendum)
Visit Information  Goals Addressed            This Visit's Progress     Patient Stated   . PharmD "I have a lot of medications" (pt-stated)       Current Barriers:  . Polypharmacy; complex patient with multiple comorbidities including polysubstance abuse, COPD, HLD . Contacts me today noting that she is due for a refill of her inhalers from [facility]. Wonders if I can help with the refill process . Also notes that she needs a refill on atorvastatin.  Marland Kitchen Has her most recent AVS and wonders if she has appointments at the office on both 01/11/2019 and 02/12/2019  Pharmacist Clinical Goal(s):  Marland Kitchen Over the next 90 days, patient will work with PharmD and provider towards optimized medication management  Interventions: . Contacted [facility] with [facility] [phone]). Requested a refill of Dulera and Proventil; they are processing now and should arrive to patient's home in 7-10 business days. Called patient and explained this; she expressed understanding.  . Encouraged patient to contact her pharmacy to place refill request for atorvastatin. However, will also notify Marnee Guarneri, NP that patient requests a refill. . Discussed that phone visit on 01/11/2019 is a call from [employer], [place]. Patient has not connected with Brooke yet. Provided patient Brooke's phone number to call her, as patient often doesn't answer the phone from numbers she doesn't know  Patient Self Care Activities:  . Patient will take medications as prescribed  Initial goal documentation        Print copy of patient instructions provided.   Plan:  - Will outreach patient in ~4-5 weeks for continued medication management support  [name], PharmD Clinical Pharmacist [place] [phone]

## 2019-01-08 NOTE — Chronic Care Management (AMB) (Signed)
Chronic Care Management   Follow Up Note   01/08/2019 Name: Kristen Mills MRN: [id number] DOB: 08-17-1950  Referred by: Kristen Lick, NP Reason for referral : Chronic Care Management (Medication Management)   Kristen Mills is a 68 y.o. year old female who is a primary care patient of Cannady, Barbaraann Faster, NP. The CCM team was consulted for assistance with chronic disease management and care coordination needs.    Patient called me with questions about refills.   Review of patient status, including review of consultants reports, relevant laboratory and other test results, and collaboration with appropriate care team members and the patient's provider was performed as part of comprehensive patient evaluation and provision of chronic care management services.    SDOH (Social Determinants of Health) screening performed today: Financial Strain  Alcohol/Substance Use. See Care Plan for related entries.   Advanced Directives Status: N See Care Plan and [employer] application for related entries.  Outpatient Encounter Medications as of 01/08/2019  Medication Sig   albuterol (VENTOLIN HFA) 108 (90 Base) MCG/ACT inhaler INHALE 2 PUFFS EVERY FOUR TO SIX HOURS AS NEEDED FOR SHORTNESS OF BREATH.   amLODipine (NORVASC) 5 MG tablet Take 1 tablet (5 mg total) by mouth daily.   atorvastatin (LIPITOR) 10 MG tablet Take 1 tablet (10 mg total) by mouth daily at 6 PM.   Cholecalciferol 1.25 MG (50000 UT) TABS Take 1 tablet by mouth once a week. For 8 weeks and then stop.  Return to office for lab draw.   gabapentin (NEURONTIN) 300 MG capsule Take 1 capsule (300 mg total) by mouth 3 (three) times daily.   hydrOXYzine (ATARAX/VISTARIL) 10 MG tablet Take 1 tablet (10 mg total) by mouth 2 (two) times daily as needed.   mometasone-formoterol (DULERA) 200-5 MCG/ACT AERO Inhale 2 puffs into the lungs 2 (two) times daily.   montelukast (SINGULAIR) 10 MG tablet Take 1 tablet (10 mg total) by mouth at  bedtime.   traZODone (DESYREL) 50 MG tablet Take 1 tablet (50 mg total) by mouth at bedtime.   No facility-administered encounter medications on file as of 01/08/2019.      Goals Addressed            This Visit's Progress     Patient Stated    PharmD "I have a lot of medications" (pt-stated)       Current Barriers:   Polypharmacy; complex patient with multiple comorbidities including polysubstance abuse, COPD, HLD  Contacts me today noting that she is due for a refill of her inhalers from [facility]. Wonders if I can help with the refill process  Also notes that she needs a refill on atorvastatin.   Has her most recent AVS and wonders if she has appointments at the office on both 01/11/2019 and 02/12/2019  Pharmacist Clinical Goal(s):   Over the next 90 days, patient will work with PharmD and provider towards optimized medication management  Interventions:  Contacted [facility] [phone]). Requested a refill of Dulera and Proventil; they are processing now and should arrive to patient's home in 7-10 business days. Called patient and explained this; she expressed understanding.   Encouraged patient to contact her pharmacy to place refill request for atorvastatin. However, will also notify Marnee Guarneri, NP that patient requests a refill.  Discussed that phone visit on 01/11/2019 is a call from [employer], [place]. Patient has not connected with Brooke yet. Provided patient Brooke's phone number to call her, as patient often doesn't answer the  phone from numbers she doesn't know  Patient Self Care Activities:   Patient will take medications as prescribed  Initial goal documentation         Plan:  - Will outreach patient in ~4-5 weeks for continued medication management support  [name], PharmD Clinical Pharmacist [place] [phone]

## 2019-01-11 ENCOUNTER — Telehealth: Payer: Self-pay

## 2019-01-11 ENCOUNTER — Ambulatory Visit: Payer: Self-pay | Admitting: Licensed Clinical Social Worker

## 2019-01-11 NOTE — Chronic Care Management (AMB) (Signed)
  Care Management   Follow Up Note   01/11/2019 Name: Kristen Mills MRN: [id number] DOB: 02-Jan-1951  Referred by: [name], NP Reason for referral : Care Coordination   Kristen Mills is a 68 y.o. year old female who is a primary care patient of Cannady, Barbaraann Faster, NP. The care management team was consulted for assistance with care management and care coordination needs.    Review of patient status, including review of consultants reports, relevant laboratory and other test results, and collaboration with appropriate care team members and the patient's provider was performed as part of comprehensive patient evaluation and provision of chronic care management services.    LCSW completed CCM outreach attempt today but was unable to reach patient successfully. A HIPPA compliant voice message was left encouraging patient to return call once available. LCSW rescheduled CCM SW appointment as well.  The care management team is available to follow up with the patient after provider conversation with the patient regarding recommendation for care management engagement and subsequent re-referral to the care management team.   Eula Fried, BSW, MSW, [email or web address] Phone: [phone]

## 2019-02-07 ENCOUNTER — Other Ambulatory Visit: Payer: Self-pay | Admitting: Nurse Practitioner

## 2019-02-11 ENCOUNTER — Other Ambulatory Visit: Payer: Self-pay

## 2019-02-12 ENCOUNTER — Ambulatory Visit: Payer: Self-pay | Admitting: Pharmacist

## 2019-02-12 ENCOUNTER — Other Ambulatory Visit: Payer: Self-pay

## 2019-02-12 ENCOUNTER — Ambulatory Visit (INDEPENDENT_AMBULATORY_CARE_PROVIDER_SITE_OTHER): Payer: PPO | Admitting: Nurse Practitioner

## 2019-02-12 ENCOUNTER — Encounter: Payer: Self-pay | Admitting: Nurse Practitioner

## 2019-02-12 VITALS — BP 116/80 | HR 96 | Temp 98.7°F | Ht 60.0 in | Wt 142.0 lb

## 2019-02-12 DIAGNOSIS — E782 Mixed hyperlipidemia: Secondary | ICD-10-CM

## 2019-02-12 DIAGNOSIS — I10 Essential (primary) hypertension: Secondary | ICD-10-CM | POA: Diagnosis not present

## 2019-02-12 DIAGNOSIS — J410 Simple chronic bronchitis: Secondary | ICD-10-CM

## 2019-02-12 DIAGNOSIS — F17219 Nicotine dependence, cigarettes, with unspecified nicotine-induced disorders: Secondary | ICD-10-CM

## 2019-02-12 MED ORDER — GABAPENTIN 300 MG PO CAPS: 300.0000 mg | ORAL_CAPSULE | Freq: Three times a day (TID) | ORAL | 2 refills | Status: DC

## 2019-02-12 MED ORDER — HYDROXYZINE HCL 10 MG PO TABS: 10.0000 mg | ORAL_TABLET | Freq: Two times a day (BID) | ORAL | 2 refills | Status: DC | PRN

## 2019-02-12 NOTE — Assessment & Plan Note (Signed)
Chronic, improved at this time with [facility].  Continue current medication regimen and adjust as needed.  Recommend complete smoking cessation, she is not interested in medication to assist.  Refuses lung CT CA screening.

## 2019-02-12 NOTE — Assessment & Plan Note (Signed)
Chronic, stable with BP at goal today.  Continue current medication regimen and adjust as needed.  Recommend checking BP at home 3 mornings a week and documenting for provider.   Return in 6 months.

## 2019-02-12 NOTE — Patient Instructions (Signed)
Eating Plan for Chronic Obstructive Pulmonary Disease Chronic obstructive pulmonary disease (COPD) causes symptoms such as shortness of breath, coughing, and chest discomfort. These symptoms can make it difficult to eat enough to maintain a healthy weight. Generally, people with COPD should eat a diet that is high in calories, protein, and other nutrients to maintain body weight and to keep the lungs as healthy as possible. Depending on the medicines you take and other health conditions you may have, your health care provider may give you additional recommendations on what to eat or avoid. Talk with your health care provider about your goals for body weight, and work with a dietitian to develop an eating plan that is right for you. What are tips for following this plan? Reading food labels   Avoid foods with more than 300 milligrams (mg) of salt (sodium) per serving.  Choose foods that contain at least 4 grams (g) of fiber per serving. Try to eat 20-30 g of fiber each day.  Choose foods that are high in calories and protein, such as nuts, beans, yogurt, and cheese. Shopping  Do not buy foods labeled as diet, low-calorie, or low-fat.  If you are able to eat dairy products: ? Avoid low-fat or skim milk. ? Buy dairy products that have at least 2% fat.  Buy nutritional supplement drinks.  Buy grains and prepared foods labeled as enriched or fortified.  Consider buying low-sodium, pre-made foods to conserve energy for eating. Cooking  Add dry milk or protein powder to smoothies.  Cook with healthy fats, such as olive oil, canola oil, sunflower oil, and grapeseed oil.  Add oil, butter, cream cheese, or nut butters to foods to increase fat and calories.  To make foods easier to chew and swallow: ? Cook vegetables, pasta, and rice until soft. ? Cut or grind meat into very small pieces. ? Dip breads in liquid. Meal planning   Eat when you feel hungry.  Eat 5-6 small meals throughout  the day.  Drink 6-8 glasses of water each day.  Do not drink liquids with meals. Drink liquids at the end of the meal to avoid feeling full too quickly.  Eat a variety of fruits and vegetables every day.  Ask for assistance from family or friends with planning and preparing meals as needed.  Avoid foods that cause you to feel bloated, such as carbonated drinks, fried foods, beans, broccoli, cabbage, and apples.  For older adults, ask your local agency on aging whether you are eligible for meal assistance programs, such as Meals on Wheels. Lifestyle   Do not smoke.  Eat slowly. Take small bites and chew food well before swallowing.  Do not overeat. This may make it more difficult to breathe after eating.  Sit up while eating.  If needed, continue to use supplemental oxygen while eating.  Rest or relax for 30 minutes before and after eating.  Monitor your weight as told by your health care provider.  Exercise as told by your health care provider. What foods can I eat? Fruits All fresh, dried, canned, or frozen fruits that do not cause gas. Vegetables All fresh, canned (no salt added), or frozen vegetables that do not cause gas. Grains Whole grain bread. Enriched whole grain pasta. Fortified whole grain cereals. Fortified rice. Quinoa. Meats and other proteins Lean meat. Poultry. Fish. Dried beans. Unsalted nuts. Tofu. Eggs. Nut butters. Dairy Whole or 2% milk. Cheese. Yogurt. Fats and oils Olive oil. Canola oil. Butter. Margarine. Beverages Water. Vegetable   juice (no salt added). Decaffeinated coffee. Decaffeinated or herbal tea. Seasonings and condiments Fresh or dried herbs. Low-salt or salt-free seasonings. Low-sodium soy sauce. The items listed above may not be a complete list of foods and beverages you can eat. Contact a dietitian for more information. What foods are not recommended? Fruits Fruits that cause gas, such as apples or melon. Vegetables Vegetables  that cause gas, such as broccoli, Brussels sprouts, cabbage, cauliflower, and onions. Canned vegetables with added salt. Meats and other proteins Fried meat. Salt-cured meat. Processed meat. Dairy Fat-free or low-fat milk, yogurt, or cheese. Processed cheese. Beverages Carbonated drinks. Caffeinated drinks, such as coffee, tea, and soft drinks. Juice. Alcohol. Vegetable juice with added salt. Seasonings and condiments Salt. Seasoning mixes with salt. Soy sauce. Angie Fava. Other foods Clear soup or broth. Fried foods. Prepared frozen meals. The items listed above may not be a complete list of foods and beverages you should avoid. Contact a dietitian for more information. Summary  COPD symptoms can make it difficult to eat enough to maintain a healthy weight.  A COPD eating plan can help you maintain your body weight and keep your lungs as healthy as possible.  Eat a diet that is high in calories, protein, and other nutrients. Read labels to make sure that you are getting the right nutrients. Cook foods to make them easy to chew and swallow.  Eat 5-6 small meals throughout the day, and avoid foods that cause gas or make you feel bloated. This information is not intended to replace advice given to you by your health care provider. Make sure you discuss any questions you have with your health care provider. Document Released: 05/30/2017 Document Revised: 07/05/2018 Document Reviewed: 05/30/2017 Elsevier Patient Education  2020 [employer].

## 2019-02-12 NOTE — Assessment & Plan Note (Signed)
I have recommended complete cessation of tobacco use. I have discussed various options available for assistance with tobacco cessation including over the counter methods (Nicotine gum, patch and lozenges). We also discussed prescription options (Chantix, Nicotine Inhaler / Nasal Spray). The patient is not interested in pursuing any prescription tobacco cessation options at this time.  

## 2019-02-12 NOTE — Progress Notes (Signed)
BP 116/80   Pulse 96   Temp 98.7 F (37.1 C) (Oral)   Ht 5' (1.524 m)   Wt 142 lb (64.4 kg)   SpO2 97%   BMI 27.73 kg/m    Subjective:    Patient ID: Kristen Mills, female    DOB: 14-May-1950, 68 y.o.   MRN: [id number]  HPI: Kristen Mills is a 68 y.o. female  Chief Complaint  Patient presents with  . COPD  . Hypertension  . Hyperlipidemia    pt does not want cologuard home test    HYPERTENSION / HYPERLIPIDEMIA Continues on Amlodipine daily and Atorvastatin. Satisfied with current treatment? yes Duration of hypertension: chronic BP monitoring frequency: not checking BP range:  BP medication side effects: no Duration of hyperlipidemia: chronic Cholesterol supplements: none Aspirin: no Recent stressors: no Recurrent headaches: no Visual changes: no Palpitations: no Dyspnea: no Chest pain: no Lower extremity edema: no Dizzy/lightheaded: no  The 10-year ASCVD risk score [name] DC Jr., et al., 2013) is: 17.6%   Values used to calculate the score:     Age: 51 years     Sex: Female     Is Non-Hispanic African American: No     Diabetic: Yes     Tobacco smoker: Yes     Systolic Blood Pressure: 226 mmHg     Is BP treated: Yes     HDL Cholesterol: 67 mg/dL     Total Cholesterol: 139 mg/dL  COPD Switched to [facility] at initial visit and she reports a drastic improvement from Advair which previously used. Continues to smoke, about 1/2 to 1 PPD, has been smoking since age 68. Takes Singulair for allergy element of COPD.  Discussed lung CT screening, but does not wish to obtain at this time.  She states if she had lung CA she would not want any kind of treatment, "when the Reita Cliche wants me he will take me then". COPD status: stable Satisfied with current treatment?: yes Oxygen use: no Dyspnea frequency: improved with Dulera Rescue inhaler frequency: sometimes once a day Limitation of activity: no Productive cough: none Last Spirometry: August 2020 Pneumovax: Up to Date  Influenza: Up to Date   Relevant past medical, surgical, family and social history reviewed and updated as indicated. Interim medical history since our last visit reviewed. Allergies and medications reviewed and updated.  Review of Systems  Constitutional: Negative for activity change, appetite change, diaphoresis, fatigue and fever.  Respiratory: Negative for cough, chest tightness and shortness of breath.   Cardiovascular: Negative for chest pain, palpitations and leg swelling.  Gastrointestinal: Negative for abdominal distention, abdominal pain, constipation, diarrhea, nausea and vomiting.  Neurological: Negative for dizziness, syncope, weakness, light-headedness, numbness and headaches.  Psychiatric/Behavioral: Negative.     Per HPI unless specifically indicated above     Objective:    BP 116/80   Pulse 96   Temp 98.7 F (37.1 C) (Oral)   Ht 5' (1.524 m)   Wt 142 lb (64.4 kg)   SpO2 97%   BMI 27.73 kg/m   Wt Readings from Last 3 Encounters:  02/12/19 142 lb (64.4 kg)  11/12/18 138 lb 12.8 oz (63 kg)  08/15/18 138 lb (62.6 kg)    Physical Exam Vitals signs and nursing note reviewed.  Constitutional:      General: She is awake. She is not in acute distress.    Appearance: She is well-developed and overweight. She is not ill-appearing.  HENT:     Head: Normocephalic.  Right Ear: Hearing normal.     Left Ear: Hearing normal.  Eyes:     General: Lids are normal.        Right eye: No discharge.        Left eye: No discharge.     Conjunctiva/sclera: Conjunctivae normal.     Pupils: Pupils are equal, round, and reactive to light.  Neck:     Musculoskeletal: Normal range of motion and neck supple.     Thyroid: No thyromegaly.     Vascular: No carotid bruit.  Cardiovascular:     Rate and Rhythm: Normal rate and regular rhythm.     Heart sounds: Normal heart sounds. No murmur. No gallop.   Pulmonary:     Effort: Pulmonary effort is normal.     Breath sounds:  Decreased breath sounds present.     Comments: Clear throughout with diminished breath sounds, no wheezes or rhonchi. Abdominal:     General: Bowel sounds are normal.     Palpations: Abdomen is soft.  Musculoskeletal:     Right lower leg: No edema.     Left lower leg: No edema.  Skin:    General: Skin is warm and dry.  Neurological:     Mental Status: She is alert and oriented to person, place, and time.  Psychiatric:        Attention and Perception: Attention normal.        Mood and Affect: Mood normal.        Behavior: Behavior normal. Behavior is cooperative.        Thought Content: Thought content normal.        Judgment: Judgment normal.     Results for orders placed or performed in visit on 12/11/18  Lipid Panel w/o Chol/HDL Ratio  Result Value Ref Range   Cholesterol, Total 139 100 - 199 mg/dL   Triglycerides 152 (H) 0 - 149 mg/dL   HDL 67 >39 mg/dL   VLDL Cholesterol Cal 25 5 - 40 mg/dL   LDL Chol Calc (NIH) 47 0 - 99 mg/dL  VITAMIN D 25 Hydroxy (Vit-D Deficiency, Fractures)  Result Value Ref Range   Vit D, 25-Hydroxy 34.4 30.0 - 100.0 ng/mL  Comp Met (CMET)  Result Value Ref Range   Glucose 122 (H) 65 - 99 mg/dL   BUN 9 8 - 27 mg/dL   Creatinine, Ser 1.10 (H) 0.57 - 1.00 mg/dL   GFR calc non Af Amer 52 (L) >59 mL/min/1.73   GFR calc Af Amer 60 >59 mL/min/1.73   BUN/Creatinine Ratio 8 (L) 12 - 28   Sodium 141 134 - 144 mmol/L   Potassium 4.2 3.5 - 5.2 mmol/L   Chloride 102 96 - 106 mmol/L   CO2 25 20 - 29 mmol/L   Calcium 9.5 8.7 - 10.3 mg/dL   Total Protein 6.9 6.0 - 8.5 g/dL   Albumin 4.5 3.8 - 4.8 g/dL   Globulin, Total 2.4 1.5 - 4.5 g/dL   Albumin/Globulin Ratio 1.9 1.2 - 2.2   Bilirubin Total 0.6 0.0 - 1.2 mg/dL   Alkaline Phosphatase 101 39 - 117 IU/L   AST 25 0 - 40 IU/L   ALT 18 0 - 32 IU/L      Assessment & Plan:   Problem List Items Addressed This Visit      Cardiovascular and Mediastinum   Hypertension    Chronic, stable with BP at goal  today.  Continue current medication regimen and adjust as needed.  Recommend checking BP at home 3 mornings a week and documenting for provider.   Return in 6 months.         Respiratory   Simple chronic bronchitis (HCC) - Primary    Chronic, improved at this time with [facility].  Continue current medication regimen and adjust as needed.  Recommend complete smoking cessation, she is not interested in medication to assist.  Refuses lung CT CA screening.        Nervous and Auditory   Nicotine dependence, cigarettes, w unsp disorders    I have recommended complete cessation of tobacco use. I have discussed various options available for assistance with tobacco cessation including over the counter methods (Nicotine gum, patch and lozenges). We also discussed prescription options (Chantix, Nicotine Inhaler / Nasal Spray). The patient is not interested in pursuing any prescription tobacco cessation options at this time.         Other   Hyperlipemia    Chronic, ongoing with labs improved at recent visit.  Continue current medication regimen and adjust as needed.  Repeat labs and return for visit in 6 months.          Follow up plan: Return in about 6 months (around 08/12/2019) for HTN/HLD, COPD, Vit D.

## 2019-02-12 NOTE — Chronic Care Management (AMB) (Signed)
Chronic Care Management   Follow Up Note   02/12/2019 Name: Kristen Mills MRN: [id number] DOB: 1950-10-02  Referred by: [name], NP Reason for referral : Chronic Care Management (Medication Management)   Kristen Mills is a 68 y.o. year old female who is a primary care patient of Cannady, Barbaraann Faster, NP. The CCM team was consulted for assistance with chronic disease management and care coordination needs.    Met with patient face to face today prior to visit with Kristen Guarneri, NP  Review of patient status, including review of consultants reports, relevant laboratory and other test results, and collaboration with appropriate care team members and the patient's provider was performed as part of comprehensive patient evaluation and provision of chronic care management services.    SDOH (Social Determinants of Health) screening performed today: Financial Strain . See Care Plan for related entries.   Outpatient Encounter Medications as of 02/12/2019  Medication Sig  . albuterol (VENTOLIN HFA) 108 (90 Base) MCG/ACT inhaler INHALE 2 PUFFS EVERY FOUR TO SIX HOURS AS NEEDED FOR SHORTNESS OF BREATH.  Marland Kitchen atorvastatin (LIPITOR) 10 MG tablet Take 1 tablet (10 mg total) by mouth daily at 6 PM.  . Cholecalciferol (VITAMIN D3) 1.25 MG (50000 UT) CAPS TAKE 1 CAPSULE BY MOUTH ONCE A WEEK FOR 8 WEEKS THEN STOP. RETURN TO OFFICE FOR LAB DRAW.  . mometasone-formoterol (DULERA) 200-5 MCG/ACT AERO Inhale 2 puffs into the lungs 2 (two) times daily.  . montelukast (SINGULAIR) 10 MG tablet Take 1 tablet (10 mg total) by mouth at bedtime.  . traZODone (DESYREL) 50 MG tablet Take 1 tablet (50 mg total) by mouth at bedtime.  . [DISCONTINUED] gabapentin (NEURONTIN) 300 MG capsule Take 1 capsule (300 mg total) by mouth 3 (three) times daily.  . [DISCONTINUED] hydrOXYzine (ATARAX/VISTARIL) 10 MG tablet Take 1 tablet (10 mg total) by mouth 2 (two) times daily as needed.  Marland Kitchen amLODipine (NORVASC) 5 MG tablet  Take 1 tablet (5 mg total) by mouth daily.   No facility-administered encounter medications on file as of 02/12/2019.      Goals Addressed            This Visit's Progress     Patient Stated   . PharmD "I have a lot of medications" (pt-stated)       Current Barriers:  . Polypharmacy; complex patient with multiple comorbidities including polysubstance abuse, COPD, HLD o COPD: Dulera 200/5 mcg BID, Proventil PRN; Receives from [employer] patient assistance. Today, brings me application for assistance for 2021 o Depression/anxiety and insomnia: trazodone 50 mg QPM, hydroxyzine 10 mg BID PRN anxiety  o ASCVD risk reduction: amlodipine 5 mg daily (BP at goal today); atorvastatin 10 mg daily (LDL at goal <70) o Allergies: montelukast 10 mg daily  Pharmacist Clinical Goal(s):  Marland Kitchen Over the next 90 days, patient will work with PharmD and provider towards optimized medication management  Interventions: . Discussed application process for 2021. [employer] application has to be mailed after 03/11/2020. Collaborated w/ patient for her signature; will collaborate w/ Kristen Mills for her signature later in December . Discussed that per [employer], they may not be providing Dulera through all of 2021. Will follow program details and determine best medication regimen recommendations for patient moving forward  Patient Self Care Activities:  . Patient will take medications as prescribed  Please see past updates related to this goal by clicking on the "Past Updates" button in the selected goal  Plan: - Will collaborate w/ patient, provider, and Susy Frizzle, CPhT for [employer] assistance for 2021 in ~3-4 weeks  [name], PharmD Clinical Pharmacist [place] [phone]

## 2019-02-12 NOTE — Assessment & Plan Note (Signed)
Chronic, ongoing with labs improved at recent visit.  Continue current medication regimen and adjust as needed.  Repeat labs and return for visit in 6 months.

## 2019-02-12 NOTE — Patient Instructions (Signed)
Visit Information  Goals Addressed            This Visit's Progress     Patient Stated   . PharmD "I have a lot of medications" (pt-stated)       Current Barriers:  . Polypharmacy; complex patient with multiple comorbidities including polysubstance abuse, COPD, HLD o COPD: Dulera 200/5 mcg BID, Proventil PRN; Receives from [employer] patient assistance. Today, brings me application for assistance for 2021 o Depression/anxiety and insomnia: trazodone 50 mg QPM, hydroxyzine 10 mg BID PRN anxiety  o ASCVD risk reduction: amlodipine 5 mg daily (BP at goal today); atorvastatin 10 mg daily (LDL at goal <70) o Allergies: montelukast 10 mg daily  Pharmacist Clinical Goal(s):  Marland Kitchen Over the next 90 days, patient will work with PharmD and provider towards optimized medication management  Interventions: . Discussed application process for 2021. [employer] application has to be mailed after 03/11/2020. Collaborated w/ patient for her signature; will collaborate w/ Marnee Guarneri for her signature later in December . Discussed that per [employer], they may not be providing Dulera through all of 2021. Will follow program details and determine best medication regimen recommendations for patient moving forward  Patient Self Care Activities:  . Patient will take medications as prescribed  Please see past updates related to this goal by clicking on the "Past Updates" button in the selected goal         The patient verbalized understanding of instructions provided today and declined a print copy of patient instruction materials.  Plan: - Will collaborate w/ patient, provider, and Susy Frizzle, CPhT for [employer] assistance for 2021 in ~3-4 weeks  [name], PharmD Clinical Pharmacist [place] [phone]

## 2019-02-19 ENCOUNTER — Other Ambulatory Visit: Payer: Self-pay

## 2019-02-27 ENCOUNTER — Ambulatory Visit: Payer: Self-pay | Admitting: Pharmacist

## 2019-02-27 DIAGNOSIS — J410 Simple chronic bronchitis: Secondary | ICD-10-CM

## 2019-02-27 NOTE — Patient Instructions (Signed)
Visit Information  Goals Addressed            This Visit's Progress     Patient Stated   . PharmD "I have a lot of medications" (pt-stated)       Current Barriers:  . Polypharmacy; complex patient with multiple comorbidities including polysubstance abuse, COPD, HLD o COPD: Dulera 200/5 mcg BID, Proventil PRN; Collaboratively applying for [employer] assistance for 2021 for [employer] and Proventil o Depression/anxiety and insomnia: trazodone 50 mg QPM, hydroxyzine 10 mg BID PRN anxiety  o ASCVD risk reduction: amlodipine 5 mg daily (BP at goal today); atorvastatin 10 mg daily (LDL at goal <70) o Allergies: montelukast 10 mg daily  Pharmacist Clinical Goal(s):  Marland Kitchen Over the next 90 days, patient will work with PharmD and provider towards optimized medication management  Interventions: . Received patient and provider portion of application for [employer] for [facility] and Proventil for 2021. Placed in the mail today. Will pass along to [employer], CPhT to follow up with the company and patient on processing and approval  Patient Self Care Activities:  . Patient will take medications as prescribed  Please see past updates related to this goal by clicking on the "Past Updates" button in the selected goal         The patient verbalized understanding of instructions provided today and declined a print copy of patient instruction materials.   Plan: - Will collaborate w/ Susy Frizzle, CPhT as above - Will outreach patient as scheduled for continued medication management support  [name], PharmD, [place] [phone]

## 2019-02-27 NOTE — Chronic Care Management (AMB) (Signed)
Chronic Care Management   Follow Up Note   02/27/2019 Name: Kristen Mills MRN: [id number] DOB: 09/25/1950  Referred by: [name], NP Reason for referral : Chronic Care Management (Medication Management)   Kristen Mills is a 68 y.o. year old female who is a primary care patient of Cannady, Barbaraann Faster, NP. The CCM team was consulted for assistance with chronic disease management and care coordination needs.    Care coordination completed today.   Review of patient status, including review of consultants reports, relevant laboratory and other test results, and collaboration with appropriate care team members and the patient's provider was performed as part of comprehensive patient evaluation and provision of chronic care management services.    SDOH (Social Determinants of Health) screening performed today: Financial Strain . See Care Plan for related entries.   Outpatient Encounter Medications as of 02/27/2019  Medication Sig  . albuterol (VENTOLIN HFA) 108 (90 Base) MCG/ACT inhaler INHALE 2 PUFFS EVERY FOUR TO SIX HOURS AS NEEDED FOR SHORTNESS OF BREATH.  Marland Kitchen amLODipine (NORVASC) 5 MG tablet Take 1 tablet (5 mg total) by mouth daily.  Marland Kitchen atorvastatin (LIPITOR) 10 MG tablet Take 1 tablet (10 mg total) by mouth daily at 6 PM.  . Cholecalciferol (VITAMIN D3) 1.25 MG (50000 UT) CAPS TAKE 1 CAPSULE BY MOUTH ONCE A WEEK FOR 8 WEEKS THEN STOP. RETURN TO OFFICE FOR LAB DRAW.  . gabapentin (NEURONTIN) 300 MG capsule Take 1 capsule (300 mg total) by mouth 3 (three) times daily.  . hydrOXYzine (ATARAX/VISTARIL) 10 MG tablet Take 1 tablet (10 mg total) by mouth 2 (two) times daily as needed.  . mometasone-formoterol (DULERA) 200-5 MCG/ACT AERO Inhale 2 puffs into the lungs 2 (two) times daily.  . montelukast (SINGULAIR) 10 MG tablet Take 1 tablet (10 mg total) by mouth at bedtime.  . traZODone (DESYREL) 50 MG tablet Take 1 tablet (50 mg total) by mouth at bedtime.   No facility-administered  encounter medications on file as of 02/27/2019.      Goals Addressed            This Visit's Progress     Patient Stated   . PharmD "I have a lot of medications" (pt-stated)       Current Barriers:  . Polypharmacy; complex patient with multiple comorbidities including polysubstance abuse, COPD, HLD o COPD: Dulera 200/5 mcg BID, Proventil PRN; Collaboratively applying for [employer] assistance for 2021 for [employer] and Proventil o Depression/anxiety and insomnia: trazodone 50 mg QPM, hydroxyzine 10 mg BID PRN anxiety  o ASCVD risk reduction: amlodipine 5 mg daily (BP at goal today); atorvastatin 10 mg daily (LDL at goal <70) o Allergies: montelukast 10 mg daily  Pharmacist Clinical Goal(s):  Marland Kitchen Over the next 90 days, patient will work with PharmD and provider towards optimized medication management  Interventions: . Received patient and provider portion of application for [employer] for [facility] and Proventil for 2021. Placed in the mail today. Will pass along to [employer], CPhT to follow up with the company and patient on processing and approval  Patient Self Care Activities:  . Patient will take medications as prescribed  Please see past updates related to this goal by clicking on the "Past Updates" button in the selected goal          Plan: - Will collaborate w/ Susy Frizzle, CPhT as above - Will outreach patient as scheduled for continued medication management support  [name], PharmD, [place]  [facility]/[employer] [phone]

## 2019-03-13 ENCOUNTER — Other Ambulatory Visit: Payer: Self-pay | Admitting: Pharmacy Technician

## 2019-03-13 ENCOUNTER — Ambulatory Visit (INDEPENDENT_AMBULATORY_CARE_PROVIDER_SITE_OTHER): Payer: PPO | Admitting: Pharmacist

## 2019-03-13 ENCOUNTER — Telehealth: Payer: Self-pay

## 2019-03-13 DIAGNOSIS — F17219 Nicotine dependence, cigarettes, with unspecified nicotine-induced disorders: Secondary | ICD-10-CM

## 2019-03-13 DIAGNOSIS — J410 Simple chronic bronchitis: Secondary | ICD-10-CM

## 2019-03-13 DIAGNOSIS — F331 Major depressive disorder, recurrent, moderate: Secondary | ICD-10-CM

## 2019-03-13 NOTE — Patient Instructions (Signed)
Visit Information  Goals Addressed            This Visit's Progress     Patient Stated   . PharmD "I have a lot of medications" (pt-stated)       Current Barriers:  . Polypharmacy; complex patient with multiple comorbidities including polysubstance abuse, COPD, HLD o COPD: Dulera 200/5 mcg BID, Proventil PRN; Collaboratively applying for [employer] assistance for 2021 for [employer] and Proventil. Received message from [facility], CPhT that patient's attestation letter was mailed 03/08/2019 o Depression/anxiety and insomnia: trazodone 50 mg QPM, hydroxyzine 10 mg BID PRN anxiety  o ASCVD risk reduction: amlodipine 5 mg daily (BP at goal today); atorvastatin 10 mg daily (LDL at goal <70) o Allergies: montelukast 10 mg daily  Pharmacist Clinical Goal(s):  Marland Kitchen Over the next 90 days, patient will work with PharmD and provider towards optimized medication management  Interventions: . Contacted patient; informed her of the above. She will be on the lookout for the letter from [place] with the attestation letter enclosed, then complete and mail everything back to [employer]. Will call me at that time if she has any questions . Asked if I could call refills into [employer] for her, as her phone doesn't always work well with typing in Rx number. She provided the following Rx numbers to me Ruthe Mannan [id number] and Proventil [id number]). Called these refills in for her, and automated system noted they would process and call her with any questions.   Patient Self Care Activities:  . Patient will take medications as prescribed  Please see past updates related to this goal by clicking on the "Past Updates" button in the selected goal         The patient verbalized understanding of instructions provided today and declined a print copy of patient instruction materials.   Plan: - Will continue to collaborate w/ CPhT and patient as above - Will outreach patient in ~3 weeks as previously scheduled  [name],  PharmD, [place] [phone]

## 2019-03-13 NOTE — Patient Outreach (Signed)
[place] [facility]) Care Management  03/13/2019  Kristen Mills April 22, 1950 [id number]   Care coordination call placed to [employer] in regards to patient's application for [facility] and Proventil.  Spoke to [place] who informed they have received the patient's application. He informed they mailed the attestation form to the patient on 03/08/2019. He informed that it could take up to 14 business days for patient to receive it due to the delays within [place]. Once they have received the completed attestation form and original application back, then they can continue to process the application.  Will route note to embedded [facility] RPh [name] as FYI.  Claudia Greenley P. Renatta Shrieves, [place] Management [phone]

## 2019-03-13 NOTE — Chronic Care Management (AMB) (Signed)
Chronic Care Management   Follow Up Note   03/13/2019 Name: AVITA LARGAESPADA MRN: [id number] DOB: 02/14/1951  Referred by: [name], NP Reason for referral : Chronic Care Management (Medication Management)   LAYSHA FETHEROLF is a 68 y.o. year old female who is a primary care patient of Cannady, Barbaraann Faster, NP. The CCM team was consulted for assistance with chronic disease management and care coordination needs.    Contacted patient to update on status of medication access needs.   Review of patient status, including review of consultants reports, relevant laboratory and other test results, and collaboration with appropriate care team members and the patient's provider was performed as part of comprehensive patient evaluation and provision of chronic care management services.    SDOH (Social Determinants of Health) screening performed today: Financial Strain . See Care Plan for related entries.   Outpatient Encounter Medications as of 03/13/2019  Medication Sig  . albuterol (VENTOLIN HFA) 108 (90 Base) MCG/ACT inhaler INHALE 2 PUFFS EVERY FOUR TO SIX HOURS AS NEEDED FOR SHORTNESS OF BREATH.  Marland Kitchen amLODipine (NORVASC) 5 MG tablet Take 1 tablet (5 mg total) by mouth daily.  Marland Kitchen atorvastatin (LIPITOR) 10 MG tablet Take 1 tablet (10 mg total) by mouth daily at 6 PM.  . Cholecalciferol (VITAMIN D3) 1.25 MG (50000 UT) CAPS TAKE 1 CAPSULE BY MOUTH ONCE A WEEK FOR 8 WEEKS THEN STOP. RETURN TO OFFICE FOR LAB DRAW.  . gabapentin (NEURONTIN) 300 MG capsule Take 1 capsule (300 mg total) by mouth 3 (three) times daily.  . hydrOXYzine (ATARAX/VISTARIL) 10 MG tablet Take 1 tablet (10 mg total) by mouth 2 (two) times daily as needed.  . mometasone-formoterol (DULERA) 200-5 MCG/ACT AERO Inhale 2 puffs into the lungs 2 (two) times daily.  . montelukast (SINGULAIR) 10 MG tablet Take 1 tablet (10 mg total) by mouth at bedtime.  . traZODone (DESYREL) 50 MG tablet Take 1 tablet (50 mg total) by mouth at  bedtime.   No facility-administered encounter medications on file as of 03/13/2019.     Goals Addressed            This Visit's Progress     Patient Stated   . PharmD "I have a lot of medications" (pt-stated)       Current Barriers:  . Polypharmacy; complex patient with multiple comorbidities including polysubstance abuse, COPD, HLD o COPD: Dulera 200/5 mcg BID, Proventil PRN; Collaboratively applying for [employer] assistance for 2021 for [employer] and Proventil. Received message from [facility], CPhT that patient's attestation letter was mailed 03/08/2019 o Depression/anxiety and insomnia: trazodone 50 mg QPM, hydroxyzine 10 mg BID PRN anxiety  o ASCVD risk reduction: amlodipine 5 mg daily (BP at goal today); atorvastatin 10 mg daily (LDL at goal <70) o Allergies: montelukast 10 mg daily  Pharmacist Clinical Goal(s):  Marland Kitchen Over the next 90 days, patient will work with PharmD and provider towards optimized medication management  Interventions: . Contacted patient; informed her of the above. She will be on the lookout for the letter from [place] with the attestation letter enclosed, then complete and mail everything back to [employer]. Will call me at that time if she has any questions . Asked if I could call refills into [employer] for her, as her phone doesn't always work well with typing in Rx number. She provided the following Rx numbers to me Ruthe Mannan [id number] and Proventil [id number]). Called these refills in for her, and automated system noted they would process and call  her with any questions.   Patient Self Care Activities:  . Patient will take medications as prescribed  Please see past updates related to this goal by clicking on the "Past Updates" button in the selected goal          Plan: - Will continue to collaborate w/ CPhT and patient as above - Will outreach patient in ~3 weeks as previously scheduled  [name], PharmD, [place] [phone]

## 2019-03-13 NOTE — Patient Outreach (Signed)
[place] [facility]) Care Management  03/13/2019  Kristen Mills 10/20/1950 [id number]  ADDENDUM  Unsuccessful outreach call placed to patient in regards to [employer] application for [facility] and Proventil.  Unfortunately patient did not answer the phone, HIPAA compliant voicemail was left.  Was calling to inform patient to be expecting the attestation letter in the mail from [employer] that was mailed to her on 03/08/2019.  Will followup with patient/[employer] in 10-14 business days after the holiday season if call is not returned.  [name] P. Nizhoni [place], [place] Management [phone]

## 2019-03-28 ENCOUNTER — Other Ambulatory Visit: Payer: Self-pay | Admitting: Pharmacy Technician

## 2019-03-28 NOTE — Patient Outreach (Signed)
[place] [facility]) Care Management  03/28/2019  TEASA JESS 1950-09-22 [id number]   Second unsuccessful outreach call placed to patient in regards to [employer] application for [employer] and Proventil.  Unfortunately patient did not answer the phone, HIPAA compliant voicemail left.  Was calling to inquire if the patient had received the attestation form that was mailed to her on 03/08/2019.  Spoke to [place] at [employer] who informed they have not received an attestation form back for the patient's application.  Will follow up with patient in 5-10 business days if call is not returned.  Karol Skarzynski P. Tanay Massiah, [place] Management [phone]

## 2019-04-05 ENCOUNTER — Telehealth: Payer: Self-pay

## 2019-04-05 ENCOUNTER — Ambulatory Visit: Payer: Self-pay | Admitting: Pharmacist

## 2019-04-05 ENCOUNTER — Other Ambulatory Visit: Payer: Self-pay | Admitting: Pharmacy Technician

## 2019-04-05 NOTE — Chronic Care Management (AMB) (Signed)
  Chronic Care Management   Note  04/05/2019 Name: KELENA TIBURCIO MRN: [id number] DOB: [birth year]  ALAISIA PAGDILAO is a 69 y.o. year old female who is a primary care patient of Cannady, Barbaraann Faster, NP. The CCM team was consulted for assistance with chronic disease management and care coordination needs.    Attempted to contact patient for medication management and medication access needs. Left HIPAA compliant message for patient to return my call at her convenience.   Follow up plan: - Will collaborate w/ Care Guide to outreach to schedule follow up call with me.   [name], PharmD, [place] [phone]

## 2019-04-05 NOTE — Patient Outreach (Signed)
[place] [facility]) Care Management  04/05/2019  Kristen Mills [birth year] [id number]  Care coordination call placed to [employer] in regards to patient's application for [facility] and Proventil HFA.  Spoke to [place] who informed they have not received the attestation form back from the patient that was mailed to her on 03/08/2019.  Will follow with patient.  [name] P. [name], [place] Management [phone]

## 2019-04-05 NOTE — Patient Outreach (Signed)
[place] [facility]) Care Management  04/05/2019  HONOKA BYARD 1950/06/02 [id number]  ADDENDUM   Unsuccessful call placed to patient regarding patient assistance receipt of attestation form from [employer] for [employer] and Proventil, HIPAA compliant voicemail left.   Was attempting a 2nd call to patient to inquire if she has received the attestation form. Spoke to [employer] and they have not received it back. It was mailed to patient on 03/08/2019.   Follow up:  Will follow up with patient and/or [employer] in 5-10 business days to inquire about attestation form.  Zohal Reny P. Kourtney Montesinos, [place] Management [phone]

## 2019-04-10 ENCOUNTER — Telehealth: Payer: Self-pay | Admitting: Nurse Practitioner

## 2019-04-10 NOTE — Chronic Care Management (AMB) (Signed)
  Care Management   Note  04/10/2019 Name: AMAYRANY SULAIMAN MRN: [id number] DOB: 03-27-1951  ESTI VUNCANNON is a 69 y.o. year old female who is a primary care patient of [name], NP and is actively engaged with the care management team. I reached out to [name] by phone today to assist with re-scheduling a follow up appointment with the Pharmacist  Follow up plan: The care management team will reach out to the patient again over the next 7 days.   [place], [place] [address] Direct Dial: [email or web address]  Website: [email or web address]

## 2019-04-17 ENCOUNTER — Other Ambulatory Visit: Payer: Self-pay | Admitting: Pharmacy Technician

## 2019-04-17 NOTE — Patient Outreach (Signed)
[facility]) Care Management  04/17/2019  AYMAR SHVARTS 1950/07/18 [id number]  Care coordination call placed to [employer] in regards to patient's application for [facility] and Proventil.  Spoke to [employer] who informed patient was APPROVED 04/09/2019-03/27/2020. She informed the Proventil was delivered earlier in the month but that the [facility] was delivered today. She informed patient received a 90 days supply of each medication.  Will outreach patient with this information.  Rozell Kettlewell P. Tmya Wig[place], [place] Management [phone]

## 2019-04-17 NOTE — Patient Outreach (Signed)
[facility]) Care Management  04/17/2019  Kristen Mills 10/17/1950 [id number]   ADDENDUM   Unsuccessful call placed to patient regarding patient assistance medication delivery of [place] and Proventil with [employer], HIPAA compliant voicemail left.   Was calling patient to inquire if she has received the medications listed above. Will route note to embedded [facility] RPh [name] to follow up with patient at next CCM call and to inform her how to obtain her refills by calling [facility].  Follow up:  Will route note to embedded [facility] RPH [name] for case closure and will remove myself from care team.  Luiz Ochoa. Meldrick Buttery, [place] Management [phone]

## 2019-04-18 NOTE — Chronic Care Management (AMB) (Signed)
°  Care Management   Note  04/18/2019 Name: Kristen Mills MRN: [id number] DOB: 10-18-1950  Kristen Mills is a 69 y.o. year old female who is a primary care patient of [name], NP and is actively engaged with the care management team. I reached out to [name] by phone today to assist with re-scheduling a follow up visit with the Pharmacist  Follow up plan: Telephone appointment with care management team member scheduled for: 05/07/2019   [place], [place] Management  [place], [place] [address] Direct Dial: [email or web address]  Website: [email or web address]

## 2019-05-07 ENCOUNTER — Other Ambulatory Visit: Payer: Self-pay | Admitting: Nurse Practitioner

## 2019-05-07 ENCOUNTER — Ambulatory Visit (INDEPENDENT_AMBULATORY_CARE_PROVIDER_SITE_OTHER): Payer: PPO | Admitting: Pharmacist

## 2019-05-07 DIAGNOSIS — E559 Vitamin D deficiency, unspecified: Secondary | ICD-10-CM

## 2019-05-07 DIAGNOSIS — J410 Simple chronic bronchitis: Secondary | ICD-10-CM | POA: Diagnosis not present

## 2019-05-07 DIAGNOSIS — F17219 Nicotine dependence, cigarettes, with unspecified nicotine-induced disorders: Secondary | ICD-10-CM

## 2019-05-07 MED ORDER — VITAMIN D3 1.25 MG (50000 UT) PO CAPS: ORAL_CAPSULE | ORAL | 0 refills | Status: DC

## 2019-05-07 NOTE — Chronic Care Management (AMB) (Signed)
Chronic Care Management   Follow Up Note   05/07/2019 Name: Kristen Mills MRN: [id number] DOB: 05-13-1950  Referred by: [name], NP Reason for referral : Chronic Care Management (Medication Management)   Kristen Mills is a 69 y.o. year old female who is a primary care patient of Cannady, Barbaraann Faster, NP. The CCM team was consulted for assistance with chronic disease management and care coordination needs.    Contacted patient for medication management review.   Review of patient status, including review of consultants reports, relevant laboratory and other test results, and collaboration with appropriate care team members and the patient's provider was performed as part of comprehensive patient evaluation and provision of chronic care management services.    SDOH (Social Determinants of Health) screening performed today: Financial Strain  Tobacco Use. See Care Plan for related entries.   Outpatient Encounter Medications as of 05/07/2019  Medication Sig Note  . albuterol (VENTOLIN HFA) 108 (90 Base) MCG/ACT inhaler INHALE 2 PUFFS EVERY FOUR TO SIX HOURS AS NEEDED FOR SHORTNESS OF BREATH. 05/07/2019: At least twice daily  . amLODipine (NORVASC) 5 MG tablet Take 1 tablet (5 mg total) by mouth daily.   Marland Kitchen atorvastatin (LIPITOR) 10 MG tablet Take 1 tablet (10 mg total) by mouth daily at 6 PM.   . gabapentin (NEURONTIN) 300 MG capsule Take 1 capsule (300 mg total) by mouth 3 (three) times daily.   . hydrOXYzine (ATARAX/VISTARIL) 10 MG tablet Take 1 tablet (10 mg total) by mouth 2 (two) times daily as needed.   . mometasone-formoterol (DULERA) 200-5 MCG/ACT AERO Inhale 2 puffs into the lungs 2 (two) times daily.   . traZODone (DESYREL) 50 MG tablet Take 1 tablet (50 mg total) by mouth at bedtime.   . Cholecalciferol (VITAMIN D3) 1.25 MG (50000 UT) CAPS TAKE 1 CAPSULE BY MOUTH ONCE A WEEK FOR 8 WEEKS THEN STOP. RETURN TO OFFICE FOR LAB DRAW. (Patient not taking: Reported on 05/07/2019)   .  montelukast (SINGULAIR) 10 MG tablet Take 1 tablet (10 mg total) by mouth at bedtime. (Patient not taking: Reported on 05/07/2019)    No facility-administered encounter medications on file as of 05/07/2019.     Objective:   Goals Addressed            This Visit's Progress     Patient Stated   . PharmD "I have a lot of medications" (pt-stated)       Current Barriers:  . Polypharmacy; complex patient with multiple comorbidities including polysubstance abuse, COPD, HLD o COPD: Dulera 200/5 mcg BID, Proventil PRN. Approved for [facility] and Proventil assistance through 03/27/20. However, [facility] program has noted that they are ending the availability of Dulera at some point this year.  o Depression/anxiety and insomnia: trazodone 50 mg QPM, hydroxyzine 10 mg BID PRN anxiety - notes she is generally taking BID regularly  o ASCVD risk reduction: amlodipine 5 mg daily (BP at goal today); atorvastatin 10 mg daily (LDL at goal <70) o Allergies: montelukast 10 mg daily; reports she needs to call pharmacy to refill this medication.  o Health maintenance: Vitamin D 50,000 units weekly. Notes she needs a refill called in of this if she is to continue therapy  Pharmacist Clinical Goal(s):  Marland Kitchen Over the next 90 days, patient will work with PharmD and provider towards optimized medication management  Interventions: . Comprehensive medication management review, medication list updated in electronic medical record . Reviewed enrollment in [facility] and Proventil assistance through 03/27/20.  She is aware of refill procedure. If she receives communication that [facility] assistance is ending, she will let me know, and we can evaluate other options. Can consider Symbicort (through [employer] patient assistance; previously required out of pocket spend on copays, but noted recently that this is being waved) vs switch to LAMA/LABA for [employer].  . Patient up to date on refills for statin . Moving forward, will discuss taking  regular BP measurements and recording for future review.   . Will collaborate w/ provider on above. Unsure if weekly Vit D was to continue after last Vit D level, or if she was supposed to switch to daily OTC at some point.,  Patient Self Care Activities:  . Patient will take medications as prescribed  Please see past updates related to this goal by clicking on the "Past Updates" button in the selected goal          Plan:  - Scheduled f/u call 06/19/19  [name], PharmD, [place] [phone]

## 2019-05-07 NOTE — Patient Instructions (Signed)
Visit Information  Goals Addressed            This Visit's Progress     Patient Stated   . PharmD "I have a lot of medications" (pt-stated)       Current Barriers:  . Polypharmacy; complex patient with multiple comorbidities including polysubstance abuse, COPD, HLD o COPD: Dulera 200/5 mcg BID, Proventil PRN. Approved for [facility] and Proventil assistance through 03/27/20. However, [facility] program has noted that they are ending the availability of Dulera at some point this year.  o Depression/anxiety and insomnia: trazodone 50 mg QPM, hydroxyzine 10 mg BID PRN anxiety - notes she is generally taking BID regularly  o ASCVD risk reduction: amlodipine 5 mg daily (BP at goal today); atorvastatin 10 mg daily (LDL at goal <70) o Allergies: montelukast 10 mg daily; reports she needs to call pharmacy to refill this medication.  o Health maintenance: Vitamin D 50,000 units weekly. Notes she needs a refill called in of this if she is to continue therapy  Pharmacist Clinical Goal(s):  Marland Kitchen Over the next 90 days, patient will work with PharmD and provider towards optimized medication management  Interventions: . Comprehensive medication management review, medication list updated in electronic medical record . Reviewed enrollment in [facility] and Proventil assistance through 03/27/20. She is aware of refill procedure. If she receives communication that [facility] assistance is ending, she will let me know, and we can evaluate other options. Can consider Symbicort (through [employer] patient assistance; previously required out of pocket spend on copays, but noted recently that this is being waved) vs switch to LAMA/LABA for [employer].  . Patient up to date on refills for statin . Moving forward, will discuss taking regular BP measurements and recording for future review.   . Will collaborate w/ provider on above. Unsure if weekly Vit D was to continue after last Vit D level, or if she was supposed to switch to daily  OTC at some point.,  Patient Self Care Activities:  . Patient will take medications as prescribed  Please see past updates related to this goal by clicking on the "Past Updates" button in the selected goal         The patient verbalized understanding of instructions provided today and declined a print copy of patient instruction materials.   Plan:  - Scheduled f/u call 06/19/19  [name], PharmD, [place] [phone]

## 2019-06-19 ENCOUNTER — Telehealth: Payer: Self-pay

## 2019-06-21 ENCOUNTER — Ambulatory Visit: Payer: Self-pay | Admitting: Pharmacist

## 2019-06-21 ENCOUNTER — Telehealth: Payer: Self-pay

## 2019-06-21 NOTE — Chronic Care Management (AMB) (Signed)
  Chronic Care Management   Note  06/21/2019 Name: JAILANI WOODLIFF MRN: [id number] DOB: June 20, 1950  EZRI FASULO is a 69 y.o. year old female who is a primary care patient of Cannady, Barbaraann Faster, NP. The CCM team was consulted for assistance with chronic disease management and care coordination needs.    Attempted to contact patient for medication management review. This phone line is not receiving messages.    Plan: - Will collaborate with Care Guide to outreach to schedule follow up with me  [name], PharmD, [place] [phone]

## 2019-06-24 ENCOUNTER — Telehealth: Payer: Self-pay | Admitting: Nurse Practitioner

## 2019-06-24 NOTE — Chronic Care Management (AMB) (Signed)
  Care Management   Note  06/24/2019 Name: VILA LARR MRN: [id number] DOB: 1951-03-08  ALLYAH TESORIERO is a 69 y.o. year old female who is a primary care patient of [name], NP and is actively engaged with the care management team. I reached out to [name] by phone today to assist with re-scheduling a follow up visit with the Pharmacist  Follow up plan: The care management team will reach out to the patient again over the next 7 days.   Noreene Larsson, [place], [place], [place] [address] Direct Dial: [phone] [email or web address] Website: [email or web address]

## 2019-06-26 NOTE — Chronic Care Management (AMB) (Signed)
  Chronic Care Management   Outreach Note  06/26/2019 Name: MALI WALP MRN: [id number] DOB: May 07, 1950  TRYSTAN LINNANE is a 69 y.o. year old female who is a primary care patient of Cannady, Barbaraann Faster, NP. I reached out to [name] by phone today in response to a referral sent by Ms. Estill Batten Dagley's health plan.     A second unsuccessful telephone outreach was attempted today. The patient was referred to the case management team for assistance with care management and care coordination.   Follow Up Plan: The care management team will reach out to the patient again over the next 7 days.   Noreene Larsson, [place], [place], [place] [address] Direct Dial: [phone] [email or web address] Website: [email or web address]

## 2019-07-01 NOTE — Chronic Care Management (AMB) (Signed)
°  Care Management   Note  07/01/2019 Name: Kristen Mills MRN: [id number] DOB: November 26, 1950  Kristen Mills is a 69 y.o. year old female who is a primary care patient of [name], NP and is actively engaged with the care management team. I reached out to [name] by phone today to assist with re-scheduling a follow up visit with the Pharmacist  Follow up plan: Unable to make contact on outreach attempts x 3. PCP Marnee Guarneri and Pharm D  notified via routed documentation in medical record.   Noreene Larsson, [place], [place], [place] [address] Direct Dial: [phone] [email or web address] Website: [email or web address]

## 2019-07-29 ENCOUNTER — Ambulatory Visit (INDEPENDENT_AMBULATORY_CARE_PROVIDER_SITE_OTHER): Payer: PPO | Admitting: Pharmacist

## 2019-07-29 DIAGNOSIS — J410 Simple chronic bronchitis: Secondary | ICD-10-CM

## 2019-07-29 NOTE — Patient Instructions (Signed)
Visit Information  Goals Addressed            This Visit's Progress     Patient Stated   . PharmD "I have a lot of medications" (pt-stated)       Current Barriers:  . Polypharmacy; complex patient with multiple comorbidities including polysubstance abuse, COPD, HLD o COPD: Dulera 200/5 mcg BID, Proventil PRN. Approved for [facility] and Proventil assistance through 03/27/20. However, [facility] program has noted that they are ending the availability of Dulera at some point this year. Patient calls today noting that she needs a refill on Proventil today.  - Reports that she is no longer smoking, she is using a nicotine pod. Has required less albuterol recently d/t tihs o Depression/anxiety and insomnia: trazodone 50 mg QPM, hydroxyzine 10 mg BID  o ASCVD risk reduction: amlodipine 5 mg daily; atorvastatin 10 mg daily (LDL at goal <70) o Allergies: montelukast 10 mg daily; reports she needs to call pharmacy to refill this medication.  o Health maintenance: Vitamin D 50,000 units weekly.   Pharmacist Clinical Goal(s):  Marland Kitchen Over the next 90 days, patient will work with PharmD and provider towards optimized medication management  Interventions: . Comprehensive medication review performed, medication list updated in electronic medical record . Inter-disciplinary care team collaboration (see longitudinal plan of care) . Per patient request, called [employer] to request refill on Proventil. Should ship in 7-10 business days.  . Reviewed that Ruthe Mannan is no longer supplied, and we will need to switch to an alternative medication. Could switch to Symbicort, but alternatively could choose Stiolto (LABA/LAMA).  . Patient reports that she ran out of montelukast, but couldn't tell any difference in breathing without it. Consider d/c montelukast d/t lack of benefit. Will pass this along to PCP for upcoming appointment with patient  Patient Self Care Activities:  . Patient will take medications as prescribed  Please  see past updates related to this goal by clicking on the "Past Updates" button in the selected goal         Patient verbalizes understanding of instructions provided today.   Plan:  - Will discuss inhaler regimen plans w/ PCP at upcoming visit  [name], PharmD, [place] [phone]

## 2019-07-29 NOTE — Chronic Care Management (AMB) (Signed)
Chronic Care Management   Follow Up Note   07/29/2019 Name: Kristen Mills MRN: [id number] DOB: 1950/10/25  Referred by: [name], NP Reason for referral : Chronic Care Management (Medication Management)   Kristen Mills is a 69 y.o. year old female who is a primary care patient of Cannady, Barbaraann Faster, NP. The CCM team was consulted for assistance with chronic disease management and care coordination needs.    Received call from patient with medication management needs  Review of patient status, including review of consultants reports, relevant laboratory and other test results, and collaboration with appropriate care team members and the patient's provider was performed as part of comprehensive patient evaluation and provision of chronic care management services.    SDOH (Social Determinants of Health) assessments performed: Yes See Care Plan activities for detailed interventions related to [facility])     Outpatient Encounter Medications as of 07/29/2019  Medication Sig Note  . amLODipine (NORVASC) 5 MG tablet Take 1 tablet (5 mg total) by mouth daily.   Marland Kitchen atorvastatin (LIPITOR) 10 MG tablet Take 1 tablet (10 mg total) by mouth daily at 6 PM.   . Cholecalciferol (VITAMIN D3) 1.25 MG (50000 UT) CAPS TAKE 1 CAPSULE BY MOUTH ONCE A WEEK FOR 8 WEEKS THEN STOP. RETURN TO OFFICE FOR LAB DRAW.   . gabapentin (NEURONTIN) 300 MG capsule Take 1 capsule (300 mg total) by mouth 3 (three) times daily.   . mometasone-formoterol (DULERA) 200-5 MCG/ACT AERO Inhale 2 puffs into the lungs 2 (two) times daily.   Marland Kitchen albuterol (VENTOLIN HFA) 108 (90 Base) MCG/ACT inhaler INHALE 2 PUFFS EVERY FOUR TO SIX HOURS AS NEEDED FOR SHORTNESS OF BREATH. 05/07/2019: At least twice daily  . hydrOXYzine (ATARAX/VISTARIL) 10 MG tablet Take 1 tablet (10 mg total) by mouth 2 (two) times daily as needed.   . montelukast (SINGULAIR) 10 MG tablet Take 1 tablet (10 mg total) by mouth at bedtime. (Patient not taking: Reported on  05/07/2019)   . traZODone (DESYREL) 50 MG tablet Take 1 tablet (50 mg total) by mouth at bedtime.    No facility-administered encounter medications on file as of 07/29/2019.     Objective:   Goals Addressed            This Visit's Progress     Patient Stated   . PharmD "I have a lot of medications" (pt-stated)       Current Barriers:  . Polypharmacy; complex patient with multiple comorbidities including polysubstance abuse, COPD, HLD o COPD: Dulera 200/5 mcg BID, Proventil PRN. Approved for [facility] and Proventil assistance through 03/27/20. However, [facility] program has noted that they are ending the availability of Dulera at some point this year. Patient calls today noting that she needs a refill on Proventil today.  - Reports that she is no longer smoking, she is using a nicotine pod. Has required less albuterol recently d/t tihs o Depression/anxiety and insomnia: trazodone 50 mg QPM, hydroxyzine 10 mg BID  o ASCVD risk reduction: amlodipine 5 mg daily; atorvastatin 10 mg daily (LDL at goal <70) o Allergies: montelukast 10 mg daily; reports she needs to call pharmacy to refill this medication.  o Health maintenance: Vitamin D 50,000 units weekly.   Pharmacist Clinical Goal(s):  Marland Kitchen Over the next 90 days, patient will work with PharmD and provider towards optimized medication management  Interventions: . Comprehensive medication review performed, medication list updated in electronic medical record . Inter-disciplinary care team collaboration (see longitudinal plan of  care) . Per patient request, called [facility] to request refill on Proventil. Should ship in 7-10 business days.  . Reviewed that Ruthe Mannan is no longer supplied, and we will need to switch to an alternative medication. Could switch to Symbicort, but alternatively could choose Stiolto (LABA/LAMA).  . Patient reports that she ran out of montelukast, but couldn't tell any difference in breathing without it. Consider d/c montelukast d/t  lack of benefit. Will pass this along to PCP for upcoming appointment with patient  Patient Self Care Activities:  . Patient will take medications as prescribed  Please see past updates related to this goal by clicking on the "Past Updates" button in the selected goal          Plan:  - Will discuss inhaler regimen plans w/ PCP at upcoming visit  [name], PharmD, [place] [phone]

## 2019-08-14 ENCOUNTER — Ambulatory Visit: Payer: Self-pay | Admitting: Pharmacist

## 2019-08-14 ENCOUNTER — Other Ambulatory Visit: Payer: Self-pay

## 2019-08-14 ENCOUNTER — Ambulatory Visit (INDEPENDENT_AMBULATORY_CARE_PROVIDER_SITE_OTHER): Payer: PPO | Admitting: Nurse Practitioner

## 2019-08-14 ENCOUNTER — Encounter: Payer: Self-pay | Admitting: Nurse Practitioner

## 2019-08-14 ENCOUNTER — Ambulatory Visit (INDEPENDENT_AMBULATORY_CARE_PROVIDER_SITE_OTHER): Payer: PPO

## 2019-08-14 VITALS — BP 128/84 | HR 98 | Temp 98.2°F | Ht 60.63 in | Wt 137.2 lb

## 2019-08-14 VITALS — BP 128/84 | HR 98 | Ht 60.0 in | Wt 137.0 lb

## 2019-08-14 DIAGNOSIS — I10 Essential (primary) hypertension: Secondary | ICD-10-CM

## 2019-08-14 DIAGNOSIS — N1831 Chronic kidney disease, stage 3a: Secondary | ICD-10-CM

## 2019-08-14 DIAGNOSIS — N183 Chronic kidney disease, stage 3 unspecified: Secondary | ICD-10-CM | POA: Insufficient documentation

## 2019-08-14 DIAGNOSIS — F331 Major depressive disorder, recurrent, moderate: Secondary | ICD-10-CM

## 2019-08-14 DIAGNOSIS — Z598 Other problems related to housing and economic circumstances: Secondary | ICD-10-CM | POA: Diagnosis not present

## 2019-08-14 DIAGNOSIS — J410 Simple chronic bronchitis: Secondary | ICD-10-CM

## 2019-08-14 DIAGNOSIS — Z23 Encounter for immunization: Secondary | ICD-10-CM

## 2019-08-14 DIAGNOSIS — E782 Mixed hyperlipidemia: Secondary | ICD-10-CM | POA: Diagnosis not present

## 2019-08-14 DIAGNOSIS — Z6827 Body mass index (BMI) 27.0-27.9, adult: Secondary | ICD-10-CM | POA: Insufficient documentation

## 2019-08-14 DIAGNOSIS — Z6826 Body mass index (BMI) 26.0-26.9, adult: Secondary | ICD-10-CM

## 2019-08-14 DIAGNOSIS — F17219 Nicotine dependence, cigarettes, with unspecified nicotine-induced disorders: Secondary | ICD-10-CM | POA: Diagnosis not present

## 2019-08-14 DIAGNOSIS — Z0000 Encounter for general adult medical examination without abnormal findings: Secondary | ICD-10-CM

## 2019-08-14 DIAGNOSIS — R7309 Other abnormal glucose: Secondary | ICD-10-CM | POA: Diagnosis not present

## 2019-08-14 DIAGNOSIS — Z599 Problem related to housing and economic circumstances, unspecified: Secondary | ICD-10-CM

## 2019-08-14 DIAGNOSIS — F1410 Cocaine abuse, uncomplicated: Secondary | ICD-10-CM | POA: Diagnosis not present

## 2019-08-14 MED ORDER — TIOTROPIUM BROMIDE-OLODATEROL 2.5-2.5 MCG/ACT IN AERS: 2.0000 | INHALATION_SPRAY | Freq: Every day | RESPIRATORY_TRACT | 5 refills | Status: DC

## 2019-08-14 MED ORDER — BUPROPION HCL ER (XL) 150 MG PO TB24: ORAL_TABLET | ORAL | 4 refills | Status: DC

## 2019-08-14 NOTE — Patient Instructions (Signed)
Kristen Mills , Thank you for taking time to come for your Medicare Wellness Visit. I appreciate your ongoing commitment to your health goals. Please review the following plan we discussed and let me know if I can assist you in the future.   Screening recommendations/referrals: Colonoscopy: declined  Mammogram: Please call [phone] to schedule your mammogram.  Bone Density: schedule along with mammogram  Recommended yearly ophthalmology/optometry visit for glaucoma screening and checkup Recommended yearly dental visit for hygiene and checkup  Vaccinations: Influenza vaccine: up to date, due 11/2019 Pneumococcal vaccine: completed today  Tdap vaccine: up to date Shingles vaccine:  Shingrix eligible    Covid-19: declined   Advanced directives: Advance directive discussed with you today. I have provided a copy for you to complete at home and have notarized. Once this is complete please bring a copy in to our office so we can scan it into your chart.  Conditions/risks identified: current use of tobacco, discussed lung cancer screening.  If you wish to quit smoking, help is available. For free tobacco cessation program offerings call the [facility] at (574) [phone] or Live Well Line at (364) 129-2903. You may also visit [email or web address] or email [email or web address] for more information on other programs.   Your provider would like to you have your annual eye exam. Please contact your current eye doctor or here are some good options for you to contact.   [facility]         Address: [address] [place], [place] [address]    Phone: [phone]       Website: [email or web address] Address: [address], [place], [place] [address]  Phone: [phone]  Website: [email or web address]  [facility]  Address: [place], [place], [place] [address] Phone: [phone]   [facility]  Address: [place],  [place], [place] [address]  Phone: [phone]   [facility] Address: [place], [place], [place] [address]  Phone: [phone]   Next appointment: Follow up in one year for your annual wellness visit    Preventive Care 65 Years and Older, Female Preventive care refers to lifestyle choices and visits with your health care provider that can promote health and wellness. What does preventive care include?  A yearly physical exam. This is also called an annual well check.  Dental exams once or twice a year.  Routine eye exams. Ask your health care provider how often you should have your eyes checked.  Personal lifestyle choices, including:  Daily care of your teeth and gums.  Regular physical activity.  Eating a healthy diet.  Avoiding tobacco and drug use.  Limiting alcohol use.  Practicing safe sex.  Taking low-dose aspirin every day.  Taking vitamin and mineral supplements as recommended by your health care provider. What happens during an annual well check? The services and screenings done by your health care provider during your annual well check will depend on your age, overall health, lifestyle risk factors, and family history of disease. Counseling  Your health care provider may ask you questions about your:  Alcohol use.  Tobacco use.  Drug use.  Emotional well-being.  Home and relationship well-being.  Sexual activity.  Eating habits.  History of falls.  Memory and ability to understand (cognition).  Work and work Statistician.  Reproductive health. Screening  You may have the following tests or measurements:  Height, weight, and BMI.  Blood pressure.  Lipid and cholesterol levels. These may be checked every 5 years, or more frequently if you are over 47 years old.  Skin check.  Lung cancer screening. You may have this screening every year starting at age 63 if you have a 30-pack-year history of smoking and currently smoke or  have quit within the past 15 years.  Fecal occult blood test (FOBT) of the stool. You may have this test every year starting at age 44.  Flexible sigmoidoscopy or colonoscopy. You may have a sigmoidoscopy every 5 years or a colonoscopy every 10 years starting at age 78.  Hepatitis C blood test.  Hepatitis B blood test.  Sexually transmitted disease (STD) testing.  Diabetes screening. This is done by checking your blood sugar (glucose) after you have not eaten for a while (fasting). You may have this done every 1-3 years.  Bone density scan. This is done to screen for osteoporosis. You may have this done starting at age 52.  Mammogram. This may be done every 1-2 years. Talk to your health care provider about how often you should have regular mammograms. Talk with your health care provider about your test results, treatment options, and if necessary, the need for more tests. Vaccines  Your health care provider may recommend certain vaccines, such as:  Influenza vaccine. This is recommended every year.  Tetanus, diphtheria, and acellular pertussis (Tdap, Td) vaccine. You may need a Td booster every 10 years.  Zoster vaccine. You may need this after age 59.  Pneumococcal 13-valent conjugate (PCV13) vaccine. One dose is recommended after age 68.  Pneumococcal polysaccharide (PPSV23) vaccine. One dose is recommended after age 34. Talk to your health care provider about which screenings and vaccines you need and how often you need them. This information is not intended to replace advice given to you by your health care provider. Make sure you discuss any questions you have with your health care provider. Document Released: 04/10/2015 Document Revised: 12/02/2015 Document Reviewed: 01/13/2015 Elsevier Interactive Patient Education  2017 [place] Prevention in the Home Falls can cause injuries. They can happen to people of all ages. There are many things you can do to make your  home safe and to help prevent falls. What can I do on the outside of my home?  Regularly fix the edges of walkways and driveways and fix any cracks.  Remove anything that might make you trip as you walk through a door, such as a raised step or threshold.  Trim any bushes or trees on the path to your home.  Use bright outdoor lighting.  Clear any walking paths of anything that might make someone trip, such as rocks or tools.  Regularly check to see if handrails are loose or broken. Make sure that both sides of any steps have handrails.  Any raised decks and porches should have guardrails on the edges.  Have any leaves, snow, or ice cleared regularly.  Use sand or salt on walking paths during winter.  Clean up any spills in your garage right away. This includes oil or grease spills. What can I do in the bathroom?  Use night lights.  Install grab bars by the toilet and in the tub and shower. Do not use towel bars as grab bars.  Use non-skid mats or decals in the tub or shower.  If you need to sit down in the shower, use a plastic, non-slip stool.  Keep the floor dry. Clean up any water  that spills on the floor as soon as it happens.  Remove soap buildup in the tub or shower regularly.  Attach bath mats securely with double-sided non-slip rug tape.  Do not have throw rugs and other things on the floor that can make you trip. What can I do in the bedroom?  Use night lights.  Make sure that you have a light by your bed that is easy to reach.  Do not use any sheets or blankets that are too big for your bed. They should not hang down onto the floor.  Have a firm chair that has side arms. You can use this for support while you get dressed.  Do not have throw rugs and other things on the floor that can make you trip. What can I do in the kitchen?  Clean up any spills right away.  Avoid walking on wet floors.  Keep items that you use a lot in easy-to-reach places.  If  you need to reach something above you, use a strong step stool that has a grab bar.  Keep electrical cords out of the way.  Do not use floor polish or wax that makes floors slippery. If you must use wax, use non-skid floor wax.  Do not have throw rugs and other things on the floor that can make you trip. What can I do with my stairs?  Do not leave any items on the stairs.  Make sure that there are handrails on both sides of the stairs and use them. Fix handrails that are broken or loose. Make sure that handrails are as long as the stairways.  Check any carpeting to make sure that it is firmly attached to the stairs. Fix any carpet that is loose or worn.  Avoid having throw rugs at the top or bottom of the stairs. If you do have throw rugs, attach them to the floor with carpet tape.  Make sure that you have a light switch at the top of the stairs and the bottom of the stairs. If you do not have them, ask someone to add them for you. What else can I do to help prevent falls?  Wear shoes that:  Do not have high heels.  Have rubber bottoms.  Are comfortable and fit you well.  Are closed at the toe. Do not wear sandals.  If you use a stepladder:  Make sure that it is fully opened. Do not climb a closed stepladder.  Make sure that both sides of the stepladder are locked into place.  Ask someone to hold it for you, if possible.  Clearly mark and make sure that you can see:  Any grab bars or handrails.  First and last steps.  Where the edge of each step is.  Use tools that help you move around (mobility aids) if they are needed. These include:  Canes.  Walkers.  Scooters.  Crutches.  Turn on the lights when you go into a dark area. Replace any light bulbs as soon as they burn out.  Set up your furniture so you have a clear path. Avoid moving your furniture around.  If any of your floors are uneven, fix them.  If there are any pets around you, be aware of where  they are.  Review your medicines with your doctor. Some medicines can make you feel dizzy. This can increase your chance of falling. Ask your doctor what other things that you can do to help prevent falls. This information is  not intended to replace advice given to you by your health care provider. Make sure you discuss any questions you have with your health care provider. Document Released: 01/08/2009 Document Revised: 08/20/2015 Document Reviewed: 04/18/2014 Elsevier Interactive Patient Education  2017 [employer].

## 2019-08-14 NOTE — Assessment & Plan Note (Addendum)
Chronic, ongoing.  Will change from [facility] to [employer], script sent and will collaborate with CCM team on assist with this, + continue Albuterol as needed.  Discontinue Singulair.  Recommend complete smoking cessation, start Wellbutrin for mood, which may also benefit with smoking cessation.  Refuses lung CT CA screening. Return in 6 weeks.

## 2019-08-14 NOTE — Assessment & Plan Note (Signed)
Chronic, ongoing with labs improved at recent visit, LDL 47.  Continue current medication regimen and adjust as needed.  Lipid panel today.

## 2019-08-14 NOTE — Patient Instructions (Signed)
Visit Information  Goals Addressed            This Visit's Progress     Patient Stated   . PharmD "I have a lot of medications" (pt-stated)       Current Barriers:  . Polypharmacy; complex patient with multiple comorbidities including polysubstance abuse, COPD, HLD . Partial Medicare Extra Help . Patient reports today that she is committing to ceasing use of illegal substances. Notes that she was successful in doing this before, but did have headaches and "the Dts" . Notes that she is having a difficult time affording food lately.  o COPD: Dulera 200/5 mcg 2 puffs BID, Proventil HFA PRN; [employer] will no longer be supplying Dulera through the patient assistance program, plan to discuss to switch to alternative medication - Spirometry in 2020 showed obstruction w/o reversibility.  o Depression/anxiety and insomnia: trazodone 50 mg QPM, hydroxyzine 10 mg BID  o ASCVD risk reduction: amlodipine 5 mg daily (BP well controlled today); atorvastatin 10 mg daily (LDL at goal <70) o Allergies: montelukast 10 mg daily- has not been taking lately, discussed d/c o Health maintenance: Vitamin D 50,000 units weekly.   Pharmacist Clinical Goal(s):  Marland Kitchen Over the next 90 days, patient will work with PharmD and provider towards optimized medication management  Interventions: . Comprehensive medication review performed, medication list updated in electronic medical record . Inter-disciplinary care team collaboration (see longitudinal plan of care) . Recommend d/t montelukast d/t reported lack of benefit.  . Provided empathetic listening regarding patient's health and mental concerns. Agree w/ initiation of bupropion to target mood, energy.  . Recommend changing to LABA/LAMA. Patient should qualify for Stiolto assistance through [facility], if we submit proof that she only has partial Medicare Extra Help. Discussed w/ PCP. Will d/c Dulera, place order for [employer]. Will collaborate w/ CPhT to get information from  [place] regarding copays and submit to BI. Patient will bring back her Partial LIS letter.  . Pl[facility] Guide referral for food support.   Patient Self Care Activities:  . Patient will take medications as prescribed  Please see past updates related to this goal by clicking on the "Past Updates" button in the selected goal         Patient verbalizes understanding of instructions provided today.   Plan:  - Will continue to collaborate w/ patient, provider, and CPhT as above  [name], PharmD, [place] [phone]

## 2019-08-14 NOTE — Progress Notes (Signed)
Subjective:   Kristen Mills is a 69 y.o. female who presents for Medicare Annual (Subsequent) preventive examination.    Review of Systems:   Cardiac Risk Factors include: advanced age (>48men, >50 women);dyslipidemia;hypertension     Objective:     Vitals: BP 128/84 (BP Location: Left Arm)   Pulse 98   Ht 5' (1.524 m)   Wt 137 lb (62.1 kg)   BMI 26.76 kg/m   Body mass index is 26.76 kg/m.  Advanced Directives 08/14/2019 06/19/2016  Does Patient Have a Medical Advance Directive? No No  Does patient want to make changes to medical advance directive? Yes (MAU/Ambulatory/Procedural Areas - Information given) -  Would patient like information on creating a medical advance directive? - Yes (ED - Information included in AVS)    Tobacco Social History   Tobacco Use  Smoking Status Current Every Day Smoker  . Packs/day: 1.00  . Years: 45.00  . Pack years: 45.00  . Types: Cigarettes  Smokeless Tobacco Never Used     Ready to quit: Not Answered Counseling given: Not Answered   Clinical Intake:  Pre-visit preparation completed: Yes  Pain : No/denies pain     Nutritional Status: BMI 25 -29 Overweight Nutritional Risks: None Diabetes: No  How often do you need to have someone help you when you read instructions, pamphlets, or other written materials from your doctor or pharmacy?: 1 - Never  Interpreter Needed?: No  Information entered by :: Yee Joss,LPN  Past Medical History:  Diagnosis Date  . Asthma   . Cancer [facility])    Uterine Cancer  . COPD (chronic obstructive pulmonary disease) ([place])   . Depression   . Diabetes mellitus without complication ([place])   . Hyperlipidemia   . Hypertension   . Insomnia   . Polysubstance abuse ([place])   . Spinal stenosis    Past Surgical History:  Procedure Laterality Date  . ABDOMINAL HYSTERECTOMY     Family History  Problem Relation Age of Onset  . Cancer Mother        Lung  . Cancer Father        Lung  .  Heart disease Sister   . Heart attack Sister   . Cancer Brother        Lung  . Bipolar disorder Son    Social History   Socioeconomic History  . Marital status: Single    Spouse name: Not on file  . Number of children: Not on file  . Years of education: Not on file  . Highest education level: Not on file  Occupational History  . Not on file  Tobacco Use  . Smoking status: Current Every Day Smoker    Packs/day: 1.00    Years: 45.00    Pack years: 45.00    Types: Cigarettes  . Smokeless tobacco: Never Used  Substance and Sexual Activity  . Alcohol use: Yes    Alcohol/week: 3.0 standard drinks    Types: 3 Cans of beer per week    Comment: Few beers daily.  . Drug use: Yes    Types: "Crack" cocaine, Marijuana    Comment: some current use  . Sexual activity: Not Currently  Other Topics Concern  . Not on file  Social History Narrative  . Not on file   Social Determinants of Health   Financial Resource Strain: Medium Risk  . Difficulty of Paying Living Expenses: Somewhat hard  Food Insecurity:   . Worried About [employer] of  Food in the Last Year:   . [place] in the Last Year:   Transportation Needs:   . Film/video editor (Medical):   Marland Kitchen Lack of Transportation (Non-Medical):   Physical Activity:   . Days of Exercise per Week:   . Minutes of Exercise per Session:   Stress:   . Feeling of Stress :   Social Connections:   . Frequency of Communication with Friends and Family:   . Frequency of Social Gatherings with Friends and Family:   . Attends Religious Services:   . Active Member of Clubs or Organizations:   . Attends Archivist Meetings:   Marland Kitchen Marital Status:     Outpatient Encounter Medications as of 08/14/2019  Medication Sig  . albuterol (VENTOLIN HFA) 108 (90 Base) MCG/ACT inhaler INHALE 2 PUFFS EVERY FOUR TO SIX HOURS AS NEEDED FOR SHORTNESS OF BREATH.  Marland Kitchen amLODipine (NORVASC) 5 MG tablet Take 1 tablet (5 mg total) by mouth daily.  Marland Kitchen  atorvastatin (LIPITOR) 10 MG tablet Take 1 tablet (10 mg total) by mouth daily at 6 PM.  . buPROPion (WELLBUTRIN XL) 150 MG 24 hr tablet Start out with 150 MG (1 tablet) daily by mouth and if tolerated in 4 days may increase to 300 MG (2 tablets) once daily by mouth.  . Cholecalciferol (VITAMIN D3) 1.25 MG (50000 UT) CAPS TAKE 1 CAPSULE BY MOUTH ONCE A WEEK FOR 8 WEEKS THEN STOP. RETURN TO OFFICE FOR LAB DRAW.  . gabapentin (NEURONTIN) 300 MG capsule Take 1 capsule (300 mg total) by mouth 3 (three) times daily.  . hydrOXYzine (ATARAX/VISTARIL) 10 MG tablet Take 1 tablet (10 mg total) by mouth 2 (two) times daily as needed.  . mometasone-formoterol (DULERA) 200-5 MCG/ACT AERO Inhale 2 puffs into the lungs 2 (two) times daily.  . traZODone (DESYREL) 50 MG tablet Take 1 tablet (50 mg total) by mouth at bedtime.  . [DISCONTINUED] montelukast (SINGULAIR) 10 MG tablet Take 1 tablet (10 mg total) by mouth at bedtime. (Patient not taking: Reported on 05/07/2019)   No facility-administered encounter medications on file as of 08/14/2019.    Activities of Daily Living In your present state of health, do you have any difficulty performing the following activities: 08/14/2019 11/12/2018  Hearing? N Y  Comment no hearing aids -  Vision? N N  Comment eyeglasses, no eye dr -  Difficulty concentrating or making decisions? N N  Walking or climbing stairs? N N  Dressing or bathing? N N  Doing errands, shopping? Y N  Comment car is in shop -  Conservation officer, nature and eating ? N -  Using the Toilet? N -  In the past six months, have you accidently leaked urine? N -  Do you have problems with loss of bowel control? N -  Managing your Medications? N -  Managing your Finances? N -  Housekeeping or managing your Housekeeping? N -  Some recent data might be hidden    Patient Care Team: [name], NP as PCP - General (Nurse Practitioner) De Hollingshead, [facility] as Pharmacist (Pharmacist)    Assessment:    This is a routine wellness examination for Kristen Mills.  Exercise Activities and Dietary recommendations Current Exercise Habits: Home exercise routine, Type of exercise: walking, Time (Minutes): 10, Frequency (Times/Week): 7, Weekly Exercise (Minutes/Week): 70, Intensity: Mild, Exercise limited by: None identified  Goals Addressed   None     Fall Risk: Fall Risk  08/14/2019 02/19/2019 11/12/2018  Falls in the past year? 0 0 0  Comment - Emmi Telephone Survey: data to providers prior to load -  Number falls in past yr: 0 - 0  Injury with Fall? 0 - 0  Follow up - - Falls evaluation completed    FALL RISK PREVENTION PERTAINING TO THE HOME:  Any stairs in or around the home? No  If so, are there any without handrails? No   Home free of loose throw rugs in walkways, pet beds, electrical cords, etc? Yes  Adequate lighting in your home to reduce risk of falls? Yes   ASSISTIVE DEVICES UTILIZED TO PREVENT FALLS:  Life alert? No  Use of a cane, walker or w/c? No  Grab bars in the bathroom? No  Shower chair or bench in shower? Yes  Elevated toilet seat or a handicapped toilet? No   DME ORDERS:  DME order needed?  Yes   TIMED UP AND GO:  Was the test performed? Yes .  Length of time to ambulate 10 feet: 8 sec.   GAIT:  Appearance of gait: Gait steady and fast without the use of an assistive device.  Education: Fall risk prevention has been discussed.  Intervention(s) required? No   DME/home health order needed?  No    Depression Screen PHQ 2/9 Scores 08/14/2019 11/12/2018 08/15/2018  PHQ - 2 Score 2 0 4  PHQ- 9 Score 10 5 16      Cognitive Function     6CIT Screen 08/14/2019  What Year? 0 points  What month? 0 points  What time? 0 points  Count back from 20 0 points  Months in reverse 0 points  Repeat phrase 0 points  Total Score 0    Immunization History  Administered Date(s) Administered  . Fluad Quad(high Dose 65+) 12/11/2018  . Influenza, High Dose Seasonal PF  02/07/2018  . Pneumococcal Conjugate-13 02/07/2018  . Pneumococcal Polysaccharide-23 08/14/2019  . Tdap 06/19/2016    Qualifies for Shingles Vaccine? Yes  Zostavax completed n/a. Due for Shingrix. Education has been provided regarding the importance of this vaccine. Pt has been advised to call insurance company to determine out of pocket expense. Advised may also receive vaccine at local pharmacy or Health Dept. Verbalized acceptance and understanding.  Tdap: up to date.  Flu Vaccine: up to date   Pneumococcal Vaccine: completed today   Covid-19 Vaccine: declined   Screening Tests Health Maintenance  Topic Date Due  . OPHTHALMOLOGY EXAM  Never done  . URINE MICROALBUMIN  Never done  . MAMMOGRAM  Never done  . DEXA SCAN  Never done  . HEMOGLOBIN A1C  05/15/2019  . COVID-19 Vaccine (1) 08/30/2019 (Originally 02/23/1967)  . COLONOSCOPY  02/12/2020 (Originally 02/22/2002)  . INFLUENZA VACCINE  10/27/2019  . FOOT EXAM  11/12/2019  . TETANUS/TDAP  06/20/2026  . Hepatitis C Screening  Completed  . PNA vac Low Risk Adult  Completed    Cancer Screenings:  Colorectal Screening: declined, has fecal occult test at home   Mammogram: ordered  Bone Density: ordered  Lung Cancer Screening: (Low Dose CT Chest recommended if Age 28-80 years, 30 pack-year currently smoking OR have quit w/in 15years.) does qualify.   Declined   Additional Screening:  Hepatitis C Screening: does qualify; Completed 2020  Vision Screening: Recommended annual ophthalmology exams for early detection of glaucoma and other disorders of the eye. Is the patient up to date with their annual eye exam? No Will give recommendations   Dental Screening: Recommended annual  dental exams for proper oral hygiene  Community Resource Referral:  CRR required this visit?  Yes   Food assistance and transportation, light bill assistance      Plan:  I have personally reviewed and addressed the Medicare Annual Wellness  questionnaire and have noted the following in the patient's chart:  A. Medical and social history B. Use of alcohol, tobacco or illicit drugs  C. Current medications and supplements D. Functional ability and status E.  Nutritional status F.  Physical activity G. Advance directives H. List of other physicians I.  Hospitalizations, surgeries, and ER visits in previous 12 months J.  [place] such as hearing and vision if needed, cognitive and depression L. Referrals and appointments   In addition, I have reviewed and discussed with patient certain preventive protocols, quality metrics, and best practice recommendations. A written personalized care plan for preventive services as well as general preventive health recommendations were provided to patient.  Signed,    Bevelyn Ngo, LPN  [id number] Nurse Health Advisor   Nurse Notes: none

## 2019-08-14 NOTE — Assessment & Plan Note (Signed)
Chronic, ongoing.  Denies SI/HI.  Continue Trazodone as sleep aid and for mood + add on Wellbutrin 150 MG x 4 days and if tolerating increase to 300 MG -- this may benefit mood and assist with smoking cessation.  Avoid benzo use due to substance abuse.  Return in 6 weeks, sooner if worsening mood.

## 2019-08-14 NOTE — Assessment & Plan Note (Signed)
Recommended eating smaller high protein, low fat meals more frequently and exercising 30 mins a day 5 times a week with a goal of 10-15lb weight loss in the next 3 months. Patient voiced their understanding and motivation to adhere to these recommendations.  

## 2019-08-14 NOTE — Assessment & Plan Note (Signed)
Chronic, ongoing.  Repeat BMP today.  No current ACE or ARB, due to severe COPD would lean towards ARB if addition of kidney protection agreed upon with patient.  If worsening function will refer to nephrology.

## 2019-08-14 NOTE — Assessment & Plan Note (Signed)
Referral to care management placed by CCM team.

## 2019-08-14 NOTE — Patient Instructions (Signed)
Living With Depression Everyone experiences occasional disappointment, sadness, and loss in their lives. When you are feeling down, blue, or sad for at least 2 weeks in a row, it may mean that you have depression. Depression can affect your thoughts and feelings, relationships, daily activities, and physical health. It is caused by changes in the way your brain functions. If you receive a diagnosis of depression, your health care provider will tell you which type of depression you have and what treatment options are available to you. If you are living with depression, there are ways to help you recover from it and also ways to prevent it from coming back. How to cope with lifestyle changes Coping with stress     Stress is your body's reaction to life changes and events, both good and bad. Stressful situations may include:  Getting married.  The death of a spouse.  Losing a job.  Retiring.  Having a baby. Stress can last just a few hours or it can be ongoing. Stress can play a major role in depression, so it is important to learn both how to cope with stress and how to think about it differently. Talk with your health care provider or a counselor if you would like to learn more about stress reduction. He or she may suggest some stress reduction techniques, such as:  Music therapy. This can include creating music or listening to music. Choose music that you enjoy and that inspires you.  Mindfulness-based meditation. This kind of meditation can be done while sitting or walking. It involves being aware of your normal breaths, rather than trying to control your breathing.  Centering prayer. This is a kind of meditation that involves focusing on a spiritual word or phrase. Choose a word, phrase, or sacred image that is meaningful to you and that brings you peace.  Deep breathing. To do this, expand your stomach and inhale slowly through your nose. Hold your breath for 3-5 seconds, then exhale  slowly, allowing your stomach muscles to relax.  Muscle relaxation. This involves intentionally tensing muscles then relaxing them. Choose a stress reduction technique that fits your lifestyle and personality. Stress reduction techniques take time and practice to develop. Set aside 5-15 minutes a day to do them. Therapists can offer training in these techniques. The training may be covered by some insurance plans. Other things you can do to manage stress include:  Keeping a stress diary. This can help you learn what triggers your stress and ways to control your response.  Understanding what your limits are and saying no to requests or events that lead to a schedule that is too full.  Thinking about how you respond to certain situations. You may not be able to control everything, but you can control how you react.  Adding humor to your life by watching funny films or TV shows.  Making time for activities that help you relax and not feeling guilty about spending your time this way.  Medicines Your health care provider may suggest certain medicines if he or she feels that they will help improve your condition. Avoid using alcohol and other substances that may prevent your medicines from working properly (may interact). It is also important to:  Talk with your pharmacist or health care provider about all the medicines that you take, their possible side effects, and what medicines are safe to take together.  Make it your goal to take part in all treatment decisions (shared decision-making). This includes giving input on   the side effects of medicines. It is best if shared decision-making with your health care provider is part of your total treatment plan. If your health care provider prescribes a medicine, you may not notice the full benefits of it for 4-8 weeks. Most people who are treated for depression need to be on medicine for at least 6-12 months after they feel better. If you are taking  medicines as part of your treatment, do not stop taking medicines without first talking to your health care provider. You may need to have the medicine slowly decreased (tapered) over time to decrease the risk of harmful side effects. Relationships Your health care provider may suggest family therapy along with individual therapy and drug therapy. While there may not be family problems that are causing you to feel depressed, it is still important to make sure your family learns as much as they can about your mental health. Having your family's support can help make your treatment successful. How to recognize changes in your condition Everyone has a different response to treatment for depression. Recovery from major depression happens when you have not had signs of major depression for two months. This may mean that you will start to:  Have more interest in doing activities.  Feel less hopeless than you did 2 months ago.  Have more energy.  Overeat less often, or have better or improving appetite.  Have better concentration. Your health care provider will work with you to decide the next steps in your recovery. It is also important to recognize when your condition is getting worse. Watch for these signs:  Having fatigue or low energy.  Eating too much or too little.  Sleeping too much or too little.  Feeling restless, agitated, or hopeless.  Having trouble concentrating or making decisions.  Having unexplained physical complaints.  Feeling irritable, angry, or aggressive. Get help as soon as you or your family members notice these symptoms coming back. How to get support and help from others How to talk with friends and family members about your condition  Talking to friends and family members about your condition can provide you with one way to get support and guidance. Reach out to trusted friends or family members, explain your symptoms to them, and let them know that you are  working with a health care provider to treat your depression. Financial resources Not all insurance plans cover mental health care, so it is important to check with your insurance carrier. If paying for co-pays or counseling services is a problem, search for a local or county mental health care center. They may be able to offer public mental health care services at low or no cost when you are not able to see a private health care provider. If you are taking medicine for depression, you may be able to get the generic form, which may be less expensive. Some makers of prescription medicines also offer help to patients who cannot afford the medicines they need. Follow these instructions at home:   Get the right amount and quality of sleep.  Cut down on using caffeine, tobacco, alcohol, and other potentially harmful substances.  Try to exercise, such as walking or lifting small weights.  Take over-the-counter and prescription medicines only as told by your health care provider.  Eat a healthy diet that includes plenty of vegetables, fruits, whole grains, low-fat dairy products, and lean protein. Do not eat a lot of foods that are high in solid fats, added sugars, or salt.    Keep all follow-up visits as told by your health care provider. This is important. Contact a health care provider if:  You stop taking your antidepressant medicines, and you have any of these symptoms: ? Nausea. ? Headache. ? Feeling lightheaded. ? Chills and body aches. ? Not being able to sleep (insomnia).  You or your friends and family think your depression is getting worse. Get help right away if:  You have thoughts of hurting yourself or others. If you ever feel like you may hurt yourself or others, or have thoughts about taking your own life, get help right away. You can go to your nearest emergency department or call:  Your local emergency services (911 in the [place]  A suicide crisis helpline, such as the  National Suicide Prevention Lifeline at 1-800-273-8255. This is open 24-hours a day. Summary  If you are living with depression, there are ways to help you recover from it and also ways to prevent it from coming back.  Work with your health care team to create a management plan that includes counseling, stress management techniques, and healthy lifestyle habits. This information is not intended to replace advice given to you by your health care provider. Make sure you discuss any questions you have with your health care provider. Document Revised: 07/06/2018 Document Reviewed: 02/15/2016 Elsevier Patient Education  2020 [employer] Inc[employer]  

## 2019-08-14 NOTE — Assessment & Plan Note (Signed)
Check A1C today and initiate medication as needed if appropriate. 

## 2019-08-14 NOTE — Assessment & Plan Note (Signed)
Continue to recommend complete cessation of cocaine use due to risk factors for health.  She refuses rehab, psychiatry, or therapy at this time.  Suspect some underlying depression, refer to depression plan for further changes.  Continue to collaborate with CCM team and care management, referral placed to care management today due to food disparities.

## 2019-08-14 NOTE — Chronic Care Management (AMB) (Signed)
Chronic Care Management   Follow Up Note   08/14/2019 Name: Kristen Mills MRN: [id number] DOB: May 22, 1950  Referred by: [name], NP Reason for referral : Chronic Care Management (Medication Management)   Kristen Mills is a 69 y.o. year old female who is a primary care patient of Cannady, Barbaraann Faster, NP. The CCM team was consulted for assistance with chronic disease management and care coordination needs.    Contacted patient for medication management review.   Review of patient status, including review of consultants reports, relevant laboratory and other test results, and collaboration with appropriate care team members and the patient's provider was performed as part of comprehensive patient evaluation and provision of chronic care management services.    SDOH (Social Determinants of Health) assessments performed: Yes See Care Plan activities for detailed interventions related to SDOH)  SDOH Interventions     Most Recent Value  SDOH Interventions  SDOH Interventions for the Following Domains  Depression, Tobacco, Financial Strain, Food Insecurity  Food Insecurity Interventions  Other (Comment) [Care Guide referral]  Financial Strain Interventions  Other (Comment) [Patient assistance]  Tobacco Interventions  Other (Comment) [Discussed cessation/reduction]  Depression Interventions/Treatment   Medication       Outpatient Encounter Medications as of 08/14/2019  Medication Sig Note  . albuterol (VENTOLIN HFA) 108 (90 Base) MCG/ACT inhaler INHALE 2 PUFFS EVERY FOUR TO SIX HOURS AS NEEDED FOR SHORTNESS OF BREATH. 05/07/2019: At least twice daily  . amLODipine (NORVASC) 5 MG tablet Take 1 tablet (5 mg total) by mouth daily.   Marland Kitchen atorvastatin (LIPITOR) 10 MG tablet Take 1 tablet (10 mg total) by mouth daily at 6 PM.   . Cholecalciferol (VITAMIN D3) 1.25 MG (50000 UT) CAPS TAKE 1 CAPSULE BY MOUTH ONCE A WEEK FOR 8 WEEKS THEN STOP. RETURN TO OFFICE FOR LAB DRAW.   . gabapentin  (NEURONTIN) 300 MG capsule Take 1 capsule (300 mg total) by mouth 3 (three) times daily.   . hydrOXYzine (ATARAX/VISTARIL) 10 MG tablet Take 1 tablet (10 mg total) by mouth 2 (two) times daily as needed.   . mometasone-formoterol (DULERA) 200-5 MCG/ACT AERO Inhale 2 puffs into the lungs 2 (two) times daily.   . traZODone (DESYREL) 50 MG tablet Take 1 tablet (50 mg total) by mouth at bedtime.   . [DISCONTINUED] montelukast (SINGULAIR) 10 MG tablet Take 1 tablet (10 mg total) by mouth at bedtime. (Patient not taking: Reported on 05/07/2019)    No facility-administered encounter medications on file as of 08/14/2019.     Objective:   Goals Addressed            This Visit's Progress     Patient Stated   . PharmD "I have a lot of medications" (pt-stated)       Current Barriers:  . Polypharmacy; complex patient with multiple comorbidities including polysubstance abuse, COPD, HLD . Partial Medicare Extra Help . Patient reports today that she is committing to ceasing use of illegal substances. Notes that she was successful in doing this before, but did have headaches and "the Dts" . Notes that she is having a difficult time affording food lately.  o COPD: Dulera 200/5 mcg 2 puffs BID, Proventil HFA PRN; [employer] will no longer be supplying Dulera through the patient assistance program, plan to discuss to switch to alternative medication - Spirometry in 2020 showed obstruction w/o reversibility.  o Depression/anxiety and insomnia: trazodone 50 mg QPM, hydroxyzine 10 mg BID  o ASCVD risk reduction: amlodipine  5 mg daily (BP well controlled today); atorvastatin 10 mg daily (LDL at goal <70) o Allergies: montelukast 10 mg daily- has not been taking lately, discussed d/c o Health maintenance: Vitamin D 50,000 units weekly.   Pharmacist Clinical Goal(s):  Marland Kitchen Over the next 90 days, patient will work with PharmD and provider towards optimized medication management  Interventions: . Comprehensive  medication review performed, medication list updated in electronic medical record . Inter-disciplinary care team collaboration (see longitudinal plan of care) . Recommend d/t montelukast d/t reported lack of benefit.  . Provided empathetic listening regarding patient's health and mental concerns. Agree w/ initiation of bupropion to target mood, energy.  . Recommend changing to LABA/LAMA. Patient should qualify for Stiolto assistance through [facility], if we submit proof that she only has partial Medicare Extra Help. Discussed w/ PCP. Will d/c Dulera, place order for [employer]. Will collaborate w/ CPhT to get information from [place] regarding copays and submit to BI. Patient will bring back her Partial LIS letter.  . Placed Care Guide referral for food support.   Patient Self Care Activities:  . Patient will take medications as prescribed  Please see past updates related to this goal by clicking on the "Past Updates" button in the selected goal          Plan:  - Will continue to collaborate w/ patient, provider, and CPhT as above  [name], PharmD, [place] [phone]

## 2019-08-14 NOTE — Progress Notes (Signed)
BP 128/84 (BP Location: Left Arm, Patient Position: Sitting, Cuff Size: Normal)   Pulse 98   Temp 98.2 F (36.8 C) (Oral)   Ht 5' 0.63" (1.54 m)   Wt 137 lb 3.2 oz (62.2 kg)   SpO2 95%   BMI 26.24 kg/m    Subjective:    Patient ID: Kristen Mills, female    DOB: 08-May-1950, 69 y.o.   MRN: [id number]  HPI: Kristen Mills is a 69 y.o. female  Chief Complaint  Patient presents with  . Hypertension  . Hyperlipidemia  . COPD   HYPERTENSION / HYPERLIPIDEMIA Continues on Amlodipine daily and Atorvastatin.  A1c in August 2020 was 5.9%, denies any symptoms. Satisfied with current treatment?yes Duration of hypertension:chronic BP monitoring frequency:not checking BP range:  BP medication side effects:no Duration of hyperlipidemia:chronic Cholesterol supplements: none Aspirin:no Recent stressors:no Recurrent headaches:no Visual changes:no Palpitations:no Dyspnea:no Chest pain:no Lower extremity edema:no Dizzy/lightheaded:no The 10-year ASCVD risk score [name] DC Jr., et al., 2013) is: 23.2%   Values used to calculate the score:     Age: 86 years     Sex: Female     Is Non-Hispanic African American: No     Diabetic: Yes     Tobacco smoker: Yes     Systolic Blood Pressure: 979 mmHg     Is BP treated: Yes     HDL Cholesterol: 67 mg/dL     Total Cholesterol: 139 mg/dL   CHRONIC KIDNEY DISEASE Recent CRT 1.10 and GFR 52, no current ACE or ARB. CKD status: stable Medications renally dose: yes Previous renal evaluation: no Pneumovax:  Up to Date Influenza Vaccine:  Up to Date   DEPRESSION Continues to use cocaine, has gone 7 straight days without taking before, at baseline is using three times a week.  States she uses it due to making her feel good, without it she feels tired and unmotivated.  Refuses rehab, therapy, or psychiatry referral.  Reports she is having a food disparity due to spending money on cocaine -- has not ate meal in 2 days and only has  canned chicken in cabinet.  CCM team at bedside and patient is agreeable to referral for assistance with this, although reports she feels unworthy of this due to "I am my own problem and spend my money on something I should not, other people are more worthy". Mood status: exacerbated Satisfied with current treatment?: yes Symptom severity: moderate  Duration of current treatment : chronic Side effects: no Medication compliance: good compliance Psychotherapy/counseling: none Depressed mood: yes Anxious mood: yes Anhedonia: no Significant weight loss or gain: no Insomnia: none Fatigue: no Feelings of worthlessness or guilt: no Impaired concentration/indecisiveness: yes Suicidal ideations: no Hopelessness: no Crying spells: no Depression screen [facility] 2/9 08/14/2019 08/14/2019 11/12/2018 08/15/2018  Decreased Interest 2 1 0 2  Down, Depressed, Hopeless 2 1 0 2  PHQ - 2 Score 4 2 0 4  Altered sleeping - '2 2 3  ' Tired, decreased energy - '2 3 3  ' Change in appetite - 1 0 1  Feeling bad or failure about yourself  - 2 0 2  Trouble concentrating - 1 0 1  Moving slowly or fidgety/restless - 0 0 2  Suicidal thoughts - 0 0 0  PHQ-9 Score - '10 5 16  ' Difficult doing work/chores - Somewhat difficult - Somewhat difficult    COPD Continues on Dulera and Albuterol, not taking Singulair as feels offered no benefit.   Feels that Ruthe Mannan is  not offering benefit.  Continues to smoke, about 1/2 to 1 PPD, has been smoking since age 68.  Discussed lung CT screening last, but does not wish to obtain at this time. She states if she had lung CA she would not want any kind of treatment. COPD status:stable Satisfied with current treatment?:yes Oxygen use:no Dyspnea frequency:occasional Rescue inhaler frequency:sometimes once a day Limitation of activity:no Productive cough: none Last Spirometry:August 2020 Pneumovax:Up to Date Influenza:Up to Date   Relevant past medical, surgical, family and  social history reviewed and updated as indicated. Interim medical history since our last visit reviewed. Allergies and medications reviewed and updated.  Review of Systems  Constitutional: Negative for activity change, appetite change, diaphoresis, fatigue and fever.  Respiratory: Positive for shortness of breath (occasional at baseline) and wheezing (occasional at baseline). Negative for cough and chest tightness.   Cardiovascular: Negative for chest pain, palpitations and leg swelling.  Gastrointestinal: Negative.   Neurological: Negative for dizziness, syncope, weakness, light-headedness, numbness and headaches.  Psychiatric/Behavioral: Positive for decreased concentration and sleep disturbance. Negative for self-injury and suicidal ideas. The patient is nervous/anxious.     Per HPI unless specifically indicated above     Objective:    BP 128/84 (BP Location: Left Arm, Patient Position: Sitting, Cuff Size: Normal)   Pulse 98   Temp 98.2 F (36.8 C) (Oral)   Ht 5' 0.63" (1.54 m)   Wt 137 lb 3.2 oz (62.2 kg)   SpO2 95%   BMI 26.24 kg/m   Wt Readings from Last 3 Encounters:  08/14/19 137 lb (62.1 kg)  08/14/19 137 lb 3.2 oz (62.2 kg)  02/12/19 142 lb (64.4 kg)    Physical Exam Vitals and nursing note reviewed.  Constitutional:      General: She is awake. She is not in acute distress.    Appearance: She is well-developed and overweight. She is not ill-appearing.  HENT:     Head: Normocephalic.     Right Ear: Hearing normal.     Left Ear: Hearing normal.  Eyes:     General: Lids are normal.        Right eye: No discharge.        Left eye: No discharge.     Conjunctiva/sclera: Conjunctivae normal.     Pupils: Pupils are equal, round, and reactive to light.  Neck:     Thyroid: No thyromegaly.     Vascular: No carotid bruit.  Cardiovascular:     Rate and Rhythm: Normal rate and regular rhythm.     Heart sounds: Normal heart sounds. No murmur. No gallop.   Pulmonary:      Effort: Pulmonary effort is normal.     Breath sounds: Decreased breath sounds present.     Comments: Clear throughout with diminished breath sounds, no wheezes or rhonchi (baseline). Abdominal:     General: Bowel sounds are normal.     Palpations: Abdomen is soft.  Musculoskeletal:     Cervical back: Normal range of motion and neck supple.     Right lower leg: No edema.     Left lower leg: No edema.  Skin:    General: Skin is warm and dry.  Neurological:     Mental Status: She is alert and oriented to person, place, and time.  Psychiatric:        Attention and Perception: Attention normal.        Mood and Affect: Mood is depressed (tearfulness intermittent).  Speech: Speech normal.        Behavior: Behavior normal. Behavior is cooperative.        Thought Content: Thought content normal.     Results for orders placed or performed in visit on 12/11/18  Lipid Panel w/o Chol/HDL Ratio  Result Value Ref Range   Cholesterol, Total 139 100 - 199 mg/dL   Triglycerides 152 (H) 0 - 149 mg/dL   HDL 67 >39 mg/dL   VLDL Cholesterol Cal 25 5 - 40 mg/dL   LDL Chol Calc (NIH) 47 0 - 99 mg/dL  VITAMIN D 25 Hydroxy (Vit-D Deficiency, Fractures)  Result Value Ref Range   Vit D, 25-Hydroxy 34.4 30.0 - 100.0 ng/mL  Comp Met (CMET)  Result Value Ref Range   Glucose 122 (H) 65 - 99 mg/dL   BUN 9 8 - 27 mg/dL   Creatinine, Ser 1.10 (H) 0.57 - 1.00 mg/dL   GFR calc non Af Amer 52 (L) >59 mL/min/1.73   GFR calc Af Amer 60 >59 mL/min/1.73   BUN/Creatinine Ratio 8 (L) 12 - 28   Sodium 141 134 - 144 mmol/L   Potassium 4.2 3.5 - 5.2 mmol/L   Chloride 102 96 - 106 mmol/L   CO2 25 20 - 29 mmol/L   Calcium 9.5 8.7 - 10.3 mg/dL   Total Protein 6.9 6.0 - 8.5 g/dL   Albumin 4.5 3.8 - 4.8 g/dL   Globulin, Total 2.4 1.5 - 4.5 g/dL   Albumin/Globulin Ratio 1.9 1.2 - 2.2   Bilirubin Total 0.6 0.0 - 1.2 mg/dL   Alkaline Phosphatase 101 39 - 117 IU/L   AST 25 0 - 40 IU/L   ALT 18 0 - 32 IU/L       Assessment & Plan:   Problem List Items Addressed This Visit      Cardiovascular and Mediastinum   Hypertension    Chronic, stable with BP at goal today.  Continue current medication regimen and adjust as needed.  Recommend checking BP at home 3 mornings a week and documenting for provider.  BMP today.  Return in 6 months for BP check.       Relevant Orders   Basic metabolic panel     Respiratory   Simple chronic bronchitis (HCC)    Chronic, ongoing.  Will change from [facility] to [employer], script sent and will collaborate with CCM team on assist with this, + continue Albuterol as needed.  Discontinue Singulair.  Recommend complete smoking cessation, start Wellbutrin for mood, which may also benefit with smoking cessation.  Refuses lung CT CA screening. Return in 6 weeks.        Nervous and Auditory   Nicotine dependence, cigarettes, w unsp disorders    I have recommended complete cessation of tobacco use. I have discussed various options available for assistance with tobacco cessation including over the counter methods (Nicotine gum, patch and lozenges). We also discussed prescription options (Chantix, Nicotine Inhaler / Nasal Spray). The patient is not interested in pursuing any prescription tobacco cessation options at this time.  Refuses CT lung screening.         Genitourinary   CKD (chronic kidney disease) stage 3, GFR 30-59 ml/min    Chronic, ongoing.  Repeat BMP today.  No current ACE or ARB, due to severe COPD would lean towards ARB if addition of kidney protection agreed upon with patient.  If worsening function will refer to nephrology.        Other   Hyperlipemia  Chronic, ongoing with labs improved at recent visit, LDL 47.  Continue current medication regimen and adjust as needed.  Lipid panel today.      Relevant Orders   Lipid Panel w/o Chol/HDL Ratio   Elevated hemoglobin A1c    Check A1C today and initiate medication as needed if appropriate.      Relevant  Orders   HgB A1c   Cocaine abuse ([place])    Continue to recommend complete cessation of cocaine use due to risk factors for health.  She refuses rehab, psychiatry, or therapy at this time.  Suspect some underlying depression, refer to depression plan for further changes.  Continue to collaborate with CCM team and care management, referral placed to care management today due to food disparities.      Depression, major, recurrent, moderate (HCC) - Primary    Chronic, ongoing.  Denies SI/HI.  Continue Trazodone as sleep aid and for mood + add on Wellbutrin 150 MG x 4 days and if tolerating increase to 300 MG -- this may benefit mood and assist with smoking cessation.  Avoid benzo use due to substance abuse.  Return in 6 weeks, sooner if worsening mood.      Relevant Medications   buPROPion (WELLBUTRIN XL) 150 MG 24 hr tablet   Financial difficulties    Referral to care management placed by CCM team.      BMI 26.0-26.9,adult    Recommended eating smaller high protein, low fat meals more frequently and exercising 30 mins a day 5 times a week with a goal of 10-15lb weight loss in the next 3 months. Patient voiced their understanding and motivation to adhere to these recommendations.           Follow up plan: Return in about 6 weeks (around 09/25/2019) for MOOD .

## 2019-08-14 NOTE — Assessment & Plan Note (Addendum)
Chronic, stable with BP at goal today.  Continue current medication regimen and adjust as needed.  Recommend checking BP at home 3 mornings a week and documenting for provider.  BMP today.  Return in 6 months for BP check.

## 2019-08-14 NOTE — Assessment & Plan Note (Signed)
I have recommended complete cessation of tobacco use. I have discussed various options available for assistance with tobacco cessation including over the counter methods (Nicotine gum, patch and lozenges). We also discussed prescription options (Chantix, Nicotine Inhaler / Nasal Spray). The patient is not interested in pursuing any prescription tobacco cessation options at this time.  Refuses CT lung screening.  

## 2019-08-16 ENCOUNTER — Telehealth: Payer: Self-pay

## 2019-08-16 ENCOUNTER — Telehealth: Payer: Self-pay | Admitting: Nurse Practitioner

## 2019-08-16 NOTE — Telephone Encounter (Signed)
°  Community Resource Referral   [place] 08/16/2019   1st Attempt   DOB: 1950-04-13    AGE: 69 y.o.    GENDER: female    PCP [name], NP.   Called pt regarding Community Resource Referral LMTCB Follow up on: 08/19/2019  [place] Management [email or web address]   [id number]

## 2019-08-16 NOTE — Telephone Encounter (Signed)
Prior Authorization initiated via CoverMyMeds for [employer] Respimat 2.5-2.5MCG/ACT aerosol Key: [employer]

## 2019-08-19 ENCOUNTER — Encounter: Payer: Self-pay | Admitting: Nurse Practitioner

## 2019-08-19 NOTE — Telephone Encounter (Signed)
PA Approved.  Coverage Ends on: 03/27/2020  Routing to Clinical Pharmacist.

## 2019-08-19 NOTE — Telephone Encounter (Signed)
Email to [place] [employer]  From: Jill Alexanders [facility])  Sent: Monday, Aug 19, 2019 2:52 PM To: '[email or web address]> Subject: Secure: [place] Referral - [place] - [facility] - 08/19/19   Good Tally Joe,  Ms. Carrion is a patient at [facility] and is 69  y/o and lives alone, she does pick up food from food banks and has received food from [employer].  Here is the contact information:  Gabrella Freking [address], [address] [place], [place] [address]  07/13/2050 [phone] (H) She does not have any dietary restrictions.  Thank you [place] Kidney, and please let me know if you need anything further!   [place] . [email or web address]  [phone]

## 2019-08-19 NOTE — Telephone Encounter (Signed)
  Community Resource Referral   [place] 08/19/2019  DOB: 04/30/1950   AGE: 69 y.o.   GENDER: female   PCP [name], NP.   Called pt regarding Insurance account manager for [employer] Pt stated that she would not qualify for food stamps as she makes $1600 in SSI. She has made payment arrangements with [employer] and her family member is helping her get her $500 bill paid off. She said that the car that she has been in the shop for 2 years and she does not use it but has friends and family that take her to her appts. Asked that I send her info on [employer].  [employer] pant[facility] has been delivering meals to her and  Also dropped off groceries for her last week. She agreed to referral to [employer] for additional food.  She has bedbugs and her landlord will be paying to have her house fumigated. She said she would have to throw out 3 bags of clothes. I asked her if she would need a resource for replacing her clothes and she said that she did not as they were from her sister and she didn't wear many of them anyway.  Will mail [employer] resources  & place [place] Referral.  [place] . [email or web address]  [phone]

## 2019-08-20 ENCOUNTER — Other Ambulatory Visit: Payer: Self-pay | Admitting: Pharmacy Technician

## 2019-08-20 NOTE — Patient Outreach (Signed)
[facility]) Care Management  08/20/2019  DONALD CURTISS 04-21-1950 [id number]   Received both patient and provider portion(s) of patient assistance application(s) for Stiolto. Faxed completed application and required documents into BI.  Will follow up with company(ies) in 7-10 business days to check status of application(s).  Laiken Nohr P. [place], [place]  [phone]

## 2019-08-22 ENCOUNTER — Other Ambulatory Visit: Payer: Self-pay | Admitting: Pharmacy Technician

## 2019-08-22 NOTE — Patient Outreach (Signed)
[facility]) Care Management  08/22/2019  Kristen Mills 1950/07/09 [id number]  Care coordination call placed to BI in regards to patient's application for Stiolto.  Spoke to [place] who informed patient was APPROVED 08/21/2019-03/27/2020. She informed the medication would ship out in a couple of days.  Will follow up with patient in 5-7 business days to inquire if inhaler was received.  Promyse Ardito P. Jessabelle Markiewicz, [place]  [phone]

## 2019-08-29 ENCOUNTER — Ambulatory Visit: Payer: Self-pay | Admitting: Pharmacist

## 2019-08-29 DIAGNOSIS — J410 Simple chronic bronchitis: Secondary | ICD-10-CM

## 2019-08-29 NOTE — Patient Instructions (Signed)
Visit Information  Goals Addressed            This Visit's Progress     Patient Stated    PharmD "I have a lot of medications" (pt-stated)       Current Barriers:   Polypharmacy; complex patient with multiple comorbidities including polysubstance abuse, COPD, HLD o Reports that she is receiving her second delivery of food. Extremely appreciative o Indicates improvement in mood with start of bupropion. No SI/HI. Only used substances once in the past 2 weeks, and reports "it was just a little bit"  Partial Medicare Extra Help  Notes that she is having a difficult time affording food lately.  o COPD: Stiolto 2.5/2.5 mcg daily, albuterol PRN. Read in the package insert that "this medication should not be used for asthma w/o a steroid" o Depression/anxiety and insomnia: trazodone 50 mg QPM, hydroxyzine 10 mg BID: bupropion XL 150 mg daily  o ASCVD risk reduction: amlodipine 5 mg daily (BP well controlled today); atorvastatin 10 mg daily (LDL at goal <70) o Allergies: montelukast 10 mg daily- has not been taking lately, discussed d/c o Health maintenance: Vitamin D 50,000 units weekly.   Pharmacist Clinical Goal(s):   Over the next 90 days, patient will work with PharmD and provider towards optimized medication management  Interventions:  Praised patient for being agreeable to food security and mood intervention.   Celebrated improved mood. Encouraged the importance of follow up with PCP and continued support  Reviewed that last spirometry showed obstructive lung disease, not restrictive, and no reversibility, therefore LABA/LAMA for COPD is appropriate treatment. She verbalized understanding.   Patient Self Care Activities:   Patient will take medications as prescribed  Please see past updates related to this goal by clicking on the "Past Updates" button in the selected goal         Patient verbalizes understanding of instructions provided today.      Plan:  -  Scheduled f/u call in ~ 6 weeks  [name], PharmD, [place] [phone]

## 2019-08-29 NOTE — Chronic Care Management (AMB) (Signed)
Chronic Care Management   Follow Up Note   08/29/2019 Name: Kristen Mills MRN: [id number] DOB: 08-19-1950  Referred by: [name], NP Reason for referral : Chronic Care Management (Medication Management)   Kristen Mills is a 69 y.o. year old female who is a primary care patient of Cannady, Barbaraann Faster, NP. The CCM team was consulted for assistance with chronic disease management and care coordination needs.    Received call from patient today with medication question.   Review of patient status, including review of consultants reports, relevant laboratory and other test results, and collaboration with appropriate care team members and the patient's provider was performed as part of comprehensive patient evaluation and provision of chronic care management services.    SDOH (Social Determinants of Health) assessments performed: Yes See Care Plan activities for detailed interventions related to [facility])     Outpatient Encounter Medications as of 08/29/2019  Medication Sig Note  . albuterol (VENTOLIN HFA) 108 (90 Base) MCG/ACT inhaler INHALE 2 PUFFS EVERY FOUR TO SIX HOURS AS NEEDED FOR SHORTNESS OF BREATH. 05/07/2019: At least twice daily  . amLODipine (NORVASC) 5 MG tablet Take 1 tablet (5 mg total) by mouth daily.   Marland Kitchen atorvastatin (LIPITOR) 10 MG tablet Take 1 tablet (10 mg total) by mouth daily at 6 PM.   . buPROPion (WELLBUTRIN XL) 150 MG 24 hr tablet Start out with 150 MG (1 tablet) daily by mouth and if tolerated in 4 days may increase to 300 MG (2 tablets) once daily by mouth.   . Cholecalciferol (VITAMIN D3) 1.25 MG (50000 UT) CAPS TAKE 1 CAPSULE BY MOUTH ONCE A WEEK FOR 8 WEEKS THEN STOP. RETURN TO OFFICE FOR LAB DRAW.   . gabapentin (NEURONTIN) 300 MG capsule Take 1 capsule (300 mg total) by mouth 3 (three) times daily.   . hydrOXYzine (ATARAX/VISTARIL) 10 MG tablet Take 1 tablet (10 mg total) by mouth 2 (two) times daily as needed.   . Tiotropium Bromide-Olodaterol 2.5-2.5  MCG/ACT AERS Inhale 2 puffs into the lungs daily.   . traZODone (DESYREL) 50 MG tablet Take 1 tablet (50 mg total) by mouth at bedtime.    No facility-administered encounter medications on file as of 08/29/2019.     Objective:   Goals Addressed            This Visit's Progress     Patient Stated   . PharmD "I have a lot of medications" (pt-stated)       Current Barriers:  . Polypharmacy; complex patient with multiple comorbidities including polysubstance abuse, COPD, HLD o Reports that she is receiving her second delivery of food. Extremely appreciative o Indicates improvement in mood with start of bupropion. No SI/HI. Only used substances once in the past 2 weeks, and reports "it was just a little bit" . Partial Medicare Extra Help . Notes that she is having a difficult time affording food lately.  o COPD: Stiolto 2.5/2.5 mcg daily, albuterol PRN. Read in the package insert that "this medication should not be used for asthma w/o a steroid" o Depression/anxiety and insomnia: trazodone 50 mg QPM, hydroxyzine 10 mg BID: bupropion XL 150 mg daily  o ASCVD risk reduction: amlodipine 5 mg daily (BP well controlled today); atorvastatin 10 mg daily (LDL at goal <70) o Allergies: montelukast 10 mg daily- has not been taking lately, discussed d/c o Health maintenance: Vitamin D 50,000 units weekly.   Pharmacist Clinical Goal(s):  Marland Kitchen Over the next 90 days, patient  will work with PharmD and provider towards optimized medication management  Interventions: . Praised patient for being agreeable to food security and mood intervention.  . Celebrated improved mood. Encouraged the importance of follow up with PCP and continued support . Reviewed that last spirometry showed obstructive lung disease, not restrictive, and no reversibility, therefore LABA/LAMA for COPD is appropriate treatment. She verbalized understanding.   Patient Self Care Activities:  . Patient will take medications as  prescribed  Please see past updates related to this goal by clicking on the "Past Updates" button in the selected goal          Plan:  - Scheduled f/u call in ~ 6 weeks  [name], PharmD, [place] [phone]

## 2019-09-02 ENCOUNTER — Other Ambulatory Visit: Payer: Self-pay | Admitting: Pharmacy Technician

## 2019-09-02 NOTE — Patient Outreach (Signed)
[place] [facility]) Care Management  09/02/2019  Kristen Mills 07-May-1950 [id number]   Successful call placed to patient regarding patient assistance medication delivery of Stiolto from [facility], HIPAA identifiers verified.   Patient informed she received the Stiolto inhalers. Discussed refill procedure with patient which requires patient to call BI when she opens her last inhaler box to request a refill. Informed patient where to find the phone number on the pharmacy label on the inhaler box. Patient informed me that embedded [facility] pharmacist [name] normally helps call in her refills. She also informed that the new medication that the pharmacist started her on was "like miracle drug" and she was doing well with it.   Follow up:  Will route note to embedded [facility] RPh [name] about the information provided and will also close patient case as patient assistance is completed.Will remove myself from care team.  Luiz Ochoa. Perkins Molina, [place]  [phone]

## 2019-09-02 NOTE — Patient Outreach (Signed)
[place] [facility]) Care Management  09/02/2019  Kristen Mills [birth year] [id number]   Successful call placed to patient regarding patient assistance application(s) for Stiolto with BI , HIPAA identifiers verified.   Patient informed she was sleeping and requested I call her back today sometime after 2pm.  Follow up:  Will attempt to follow up with patient after 2pm today as requested.  [name] P. Jeramia Saleeby, [place]  [phone]

## 2019-09-25 ENCOUNTER — Other Ambulatory Visit: Payer: Self-pay

## 2019-09-25 ENCOUNTER — Encounter: Payer: Self-pay | Admitting: Nurse Practitioner

## 2019-09-25 ENCOUNTER — Ambulatory Visit (INDEPENDENT_AMBULATORY_CARE_PROVIDER_SITE_OTHER): Payer: PPO | Admitting: Nurse Practitioner

## 2019-09-25 VITALS — BP 112/68 | HR 98 | Temp 98.0°F | Wt 140.6 lb

## 2019-09-25 DIAGNOSIS — F1410 Cocaine abuse, uncomplicated: Secondary | ICD-10-CM | POA: Diagnosis not present

## 2019-09-25 DIAGNOSIS — J441 Chronic obstructive pulmonary disease with (acute) exacerbation: Secondary | ICD-10-CM | POA: Diagnosis not present

## 2019-09-25 DIAGNOSIS — Z6827 Body mass index (BMI) 27.0-27.9, adult: Secondary | ICD-10-CM

## 2019-09-25 DIAGNOSIS — F331 Major depressive disorder, recurrent, moderate: Secondary | ICD-10-CM

## 2019-09-25 DIAGNOSIS — J410 Simple chronic bronchitis: Secondary | ICD-10-CM

## 2019-09-25 DIAGNOSIS — F17219 Nicotine dependence, cigarettes, with unspecified nicotine-induced disorders: Secondary | ICD-10-CM | POA: Diagnosis not present

## 2019-09-25 MED ORDER — AZITHROMYCIN 250 MG PO TABS: ORAL_TABLET | ORAL | 0 refills | Status: DC

## 2019-09-25 MED ORDER — BUPROPION HCL ER (XL) 150 MG PO TB24: ORAL_TABLET | ORAL | 12 refills | Status: DC

## 2019-09-25 MED ORDER — PREDNISONE 20 MG PO TABS
40.0000 mg | ORAL_TABLET | Freq: Every day | ORAL | 0 refills | Status: AC
Start: 2019-09-25 — End: 2019-09-30

## 2019-09-25 NOTE — Progress Notes (Signed)
BP 112/68 (BP Location: Left Arm)   Pulse 98   Temp 98 F (36.7 C) (Oral)   Wt 140 lb 9.6 oz (63.8 kg)   SpO2 97%   BMI 27.46 kg/m    Subjective:    Patient ID: Kristen Mills, female    DOB: Aug 20, 1950, 69 y.o.   MRN: [id number]  HPI: Kristen Mills is a 69 y.o. female  Chief Complaint  Patient presents with  . Depression   DEPRESSION Started on Wellbutrin at last visit to offer benefit to mood and smoking cessation aide.  Continues to smoke about 1 PPD.  Has smoked since age [age].    Has not had any cocaine, MJ, or alcohol in 2 weeks.  She reports overall not DTs noted with taking Wellbutrin, reports in past she has had "dope sickness", but reports with Wellbutrin has not had any issues.  Refuses rehab, therapy, or psychiatry referral.  She reports improving -- does not even have urge to do cocaine.  Reported last visit having a food disparity due to spending money on cocaine -- had not ate meal in 2 days and only has canned chicken in cabinet.  Now the food bank is bringing meals and she is eating a few meals a day.   Mood status: stable -- improved Satisfied with current treatment?: yes Symptom severity: mild  Duration of current treatment : months Side effects: no Medication compliance: good compliance Psychotherapy/counseling: none Previous psychiatric medications: Wellbutrin Depressed mood: no Anxious mood: occasional Anhedonia: no Significant weight loss or gain: no Insomnia: none Fatigue: no Feelings of worthlessness or guilt: no Impaired concentration/indecisiveness: no Suicidal ideations: no Hopelessness: no Crying spells: no Depression screen [facility] 2/9 09/25/2019 08/14/2019 08/14/2019 11/12/2018 08/15/2018  Decreased Interest '1 2 1 ' 0 2  Down, Depressed, Hopeless '1 2 1 ' 0 2  PHQ - 2 Score '2 4 2 ' 0 4  Altered sleeping 0 - '2 2 3  ' Tired, decreased energy 1 - '2 3 3  ' Change in appetite 0 - 1 0 1  Feeling bad or failure about yourself  0 - 2 0 2  Trouble  concentrating 0 - 1 0 1  Moving slowly or fidgety/restless 0 - 0 0 2  Suicidal thoughts 0 - 0 0 0  PHQ-9 Score 3 - '10 5 16  ' Difficult doing work/chores Not difficult at all - Somewhat difficult - Somewhat difficult   COPD Changed to Stiolto last visit, off Dulera.  She reports this makes her cough and pushes things up.  Uses Albuterol.  Reports she has had a burning in chest over past weeks -- which she has had before with exacerbation.  She does not want to go for chest x-ray.   COPD status: exacerbated Satisfied with current treatment?: yes Oxygen use: no Dyspnea frequency: minimal, per baseline she reports Cough frequency: a little more cough over past weeks Rescue inhaler frequency:  2-3 times a day recently Limitation of activity: no Productive cough: over weeks Last Spirometry: 2020 August Pneumovax: Up to Date Influenza: Up to Date   Relevant past medical, surgical, family and social history reviewed and updated as indicated. Interim medical history since our last visit reviewed. Allergies and medications reviewed and updated.  Review of Systems  Constitutional: Negative for activity change, appetite change, diaphoresis, fatigue and fever.  Respiratory: Negative for cough and chest tightness.   Cardiovascular: Negative for chest pain, palpitations and leg swelling.  Gastrointestinal: Negative.   Neurological: Negative for dizziness, syncope, weakness,  light-headedness, numbness and headaches.  Psychiatric/Behavioral: Positive for decreased concentration and sleep disturbance. Negative for self-injury and suicidal ideas. The patient is nervous/anxious.     Per HPI unless specifically indicated above     Objective:    BP 112/68 (BP Location: Left Arm)   Pulse 98   Temp 98 F (36.7 C) (Oral)   Wt 140 lb 9.6 oz (63.8 kg)   SpO2 97%   BMI 27.46 kg/m   Wt Readings from Last 3 Encounters:  09/25/19 140 lb 9.6 oz (63.8 kg)  08/14/19 137 lb (62.1 kg)  08/14/19 137 lb  3.2 oz (62.2 kg)    Physical Exam Vitals and nursing note reviewed.  Constitutional:      General: She is awake. She is not in acute distress.    Appearance: She is well-developed and overweight. She is not ill-appearing.  HENT:     Head: Normocephalic.     Right Ear: Hearing normal.     Left Ear: Hearing normal.  Eyes:     General: Lids are normal.        Right eye: No discharge.        Left eye: No discharge.     Conjunctiva/sclera: Conjunctivae normal.     Pupils: Pupils are equal, round, and reactive to light.  Neck:     Thyroid: No thyromegaly.     Vascular: No carotid bruit.  Cardiovascular:     Rate and Rhythm: Normal rate and regular rhythm.     Heart sounds: Normal heart sounds. No murmur heard.  No gallop.   Pulmonary:     Effort: Pulmonary effort is normal.     Breath sounds: Decreased breath sounds and wheezing present.     Comments: Clear throughout with diminished breath sounds and intermittent wheezes throughout.  No SOB with talking. Abdominal:     General: Bowel sounds are normal.     Palpations: Abdomen is soft.  Musculoskeletal:     Cervical back: Normal range of motion and neck supple.     Right lower leg: No edema.     Left lower leg: No edema.  Skin:    General: Skin is warm and dry.  Neurological:     Mental Status: She is alert and oriented to person, place, and time.  Psychiatric:        Attention and Perception: Attention normal.        Mood and Affect: Mood normal.        Speech: Speech normal.        Behavior: Behavior normal. Behavior is cooperative.        Thought Content: Thought content normal.     Comments: Very talkative and in good spirits today.     Results for orders placed or performed in visit on 12/11/18  Lipid Panel w/o Chol/HDL Ratio  Result Value Ref Range   Cholesterol, Total 139 100 - 199 mg/dL   Triglycerides 152 (H) 0 - 149 mg/dL   HDL 67 >39 mg/dL   VLDL Cholesterol Cal 25 5 - 40 mg/dL   LDL Chol Calc (NIH) 47 0 -  99 mg/dL  VITAMIN D 25 Hydroxy (Vit-D Deficiency, Fractures)  Result Value Ref Range   Vit D, 25-Hydroxy 34.4 30.0 - 100.0 ng/mL  Comp Met (CMET)  Result Value Ref Range   Glucose 122 (H) 65 - 99 mg/dL   BUN 9 8 - 27 mg/dL   Creatinine, Ser 1.10 (H) 0.57 - 1.00 mg/dL   GFR  calc non Af Amer 52 (L) >59 mL/min/1.73   GFR calc Af Amer 60 >59 mL/min/1.73   BUN/Creatinine Ratio 8 (L) 12 - 28   Sodium 141 134 - 144 mmol/L   Potassium 4.2 3.5 - 5.2 mmol/L   Chloride 102 96 - 106 mmol/L   CO2 25 20 - 29 mmol/L   Calcium 9.5 8.7 - 10.3 mg/dL   Total Protein 6.9 6.0 - 8.5 g/dL   Albumin 4.5 3.8 - 4.8 g/dL   Globulin, Total 2.4 1.5 - 4.5 g/dL   Albumin/Globulin Ratio 1.9 1.2 - 2.2   Bilirubin Total 0.6 0.0 - 1.2 mg/dL   Alkaline Phosphatase 101 39 - 117 IU/L   AST 25 0 - 40 IU/L   ALT 18 0 - 32 IU/L      Assessment & Plan:   Problem List Items Addressed This Visit      Respiratory   Simple chronic bronchitis (HCC)    Chronic, ongoing.  Continue Stiolto and Albuterol.  Recommend complete smoking cessation, start Wellbutrin for mood, which may also benefit with smoking cessation.  Refuses lung CT CA screening at this time, but provided information on this.  She does not wish to pursue pulmonary referral at this time, but may benefit from in future.  Return in 3 months.      COPD exacerbation ([place]) - Primary    Acute x 2 weeks.  She refuses imaging today.  Will send in scripts for Prednisone and Zpack.  Recommend she obtain OTC Mucinex for cough or may use Coricidin for symptoms management.  Recommend completed cessation of smoking and discussed at length benefit of obtaining yearly lung CT screening, provided her pamphlet on this and recommended.  She wishes to think about CT screening and will plan on obtaining in future she reports.  To return to office for any worsening or ongoing symptoms.      Relevant Medications   predniSONE (DELTASONE) 20 MG tablet   azithromycin (ZITHROMAX) 250  MG tablet     Nervous and Auditory   Nicotine dependence, cigarettes, w unsp disorders    I have recommended complete cessation of tobacco use. I have discussed various options available for assistance with tobacco cessation including over the counter methods (Nicotine gum, patch and lozenges). We also discussed prescription options (Chantix, Nicotine Inhaler / Nasal Spray). The patient is not interested in pursuing any prescription tobacco cessation options at this time.  Refuses CT lung screening.         Other   Cocaine abuse ([place])    Has been cutting back on use with Wellbutrin on board, currently no use in 14 days.  Praised for reduction of use and recommend complete cessation.  Suspect this is reason BP is trending downwards, discussed with her, as is using less cocaine and alcohol.      Depression, major, recurrent, moderate ([place])    Ongoing, but improved with Wellbutrin on board.  Denies SI/HI.  PHQ dropped from 10 to now 3 + she has reduced cocaine and alcohol use.  Continue current medication regimen and adjust as needed.  Refills sent in.  To alert provider or go immediately to ER if SI/HI presents.  Return in 3 months.      Relevant Medications   buPROPion (WELLBUTRIN XL) 150 MG 24 hr tablet   BMI 27.0-27.9,adult    BMI 27.46.  Recommended eating smaller high protein, low fat meals more frequently and exercising 30 mins a day 5 times a  week with a goal of 10-15lb weight loss in the next 3 months. Patient voiced their understanding and motivation to adhere to these recommendations.           Follow up plan: Return in about 3 months (around 12/26/2019) for COPD, HTN/HLD, MOOD.

## 2019-09-25 NOTE — Assessment & Plan Note (Signed)
I have recommended complete cessation of tobacco use. I have discussed various options available for assistance with tobacco cessation including over the counter methods (Nicotine gum, patch and lozenges). We also discussed prescription options (Chantix, Nicotine Inhaler / Nasal Spray). The patient is not interested in pursuing any prescription tobacco cessation options at this time.  Refuses CT lung screening.

## 2019-09-25 NOTE — Patient Instructions (Signed)
Living With Depression Everyone experiences occasional disappointment, sadness, and loss in their lives. When you are feeling down, blue, or sad for at least 2 weeks in a row, it may mean that you have depression. Depression can affect your thoughts and feelings, relationships, daily activities, and physical health. It is caused by changes in the way your brain functions. If you receive a diagnosis of depression, your health care provider will tell you which type of depression you have and what treatment options are available to you. If you are living with depression, there are ways to help you recover from it and also ways to prevent it from coming back. How to cope with lifestyle changes Coping with stress     Stress is your body's reaction to life changes and events, both good and bad. Stressful situations may include:  Getting married.  The death of a spouse.  Losing a job.  Retiring.  Having a baby. Stress can last just a few hours or it can be ongoing. Stress can play a major role in depression, so it is important to learn both how to cope with stress and how to think about it differently. Talk with your health care provider or a counselor if you would like to learn more about stress reduction. He or she may suggest some stress reduction techniques, such as:  Music therapy. This can include creating music or listening to music. Choose music that you enjoy and that inspires you.  Mindfulness-based meditation. This kind of meditation can be done while sitting or walking. It involves being aware of your normal breaths, rather than trying to control your breathing.  Centering prayer. This is a kind of meditation that involves focusing on a spiritual word or phrase. Choose a word, phrase, or sacred image that is meaningful to you and that brings you peace.  Deep breathing. To do this, expand your stomach and inhale slowly through your nose. Hold your breath for 3-5 seconds, then exhale  slowly, allowing your stomach muscles to relax.  Muscle relaxation. This involves intentionally tensing muscles then relaxing them. Choose a stress reduction technique that fits your lifestyle and personality. Stress reduction techniques take time and practice to develop. Set aside 5-15 minutes a day to do them. Therapists can offer training in these techniques. The training may be covered by some insurance plans. Other things you can do to manage stress include:  Keeping a stress diary. This can help you learn what triggers your stress and ways to control your response.  Understanding what your limits are and saying no to requests or events that lead to a schedule that is too full.  Thinking about how you respond to certain situations. You may not be able to control everything, but you can control how you react.  Adding humor to your life by watching funny films or TV shows.  Making time for activities that help you relax and not feeling guilty about spending your time this way.  Medicines Your health care provider may suggest certain medicines if he or she feels that they will help improve your condition. Avoid using alcohol and other substances that may prevent your medicines from working properly (may interact). It is also important to:  Talk with your pharmacist or health care provider about all the medicines that you take, their possible side effects, and what medicines are safe to take together.  Make it your goal to take part in all treatment decisions (shared decision-making). This includes giving input on   the side effects of medicines. It is best if shared decision-making with your health care provider is part of your total treatment plan. If your health care provider prescribes a medicine, you may not notice the full benefits of it for 4-8 weeks. Most people who are treated for depression need to be on medicine for at least 6-12 months after they feel better. If you are taking  medicines as part of your treatment, do not stop taking medicines without first talking to your health care provider. You may need to have the medicine slowly decreased (tapered) over time to decrease the risk of harmful side effects. Relationships Your health care provider may suggest family therapy along with individual therapy and drug therapy. While there may not be family problems that are causing you to feel depressed, it is still important to make sure your family learns as much as they can about your mental health. Having your family's support can help make your treatment successful. How to recognize changes in your condition Everyone has a different response to treatment for depression. Recovery from major depression happens when you have not had signs of major depression for two months. This may mean that you will start to:  Have more interest in doing activities.  Feel less hopeless than you did 2 months ago.  Have more energy.  Overeat less often, or have better or improving appetite.  Have better concentration. Your health care provider will work with you to decide the next steps in your recovery. It is also important to recognize when your condition is getting worse. Watch for these signs:  Having fatigue or low energy.  Eating too much or too little.  Sleeping too much or too little.  Feeling restless, agitated, or hopeless.  Having trouble concentrating or making decisions.  Having unexplained physical complaints.  Feeling irritable, angry, or aggressive. Get help as soon as you or your family members notice these symptoms coming back. How to get support and help from others How to talk with friends and family members about your condition  Talking to friends and family members about your condition can provide you with one way to get support and guidance. Reach out to trusted friends or family members, explain your symptoms to them, and let them know that you are  working with a health care provider to treat your depression. Financial resources Not all insurance plans cover mental health care, so it is important to check with your insurance carrier. If paying for co-pays or counseling services is a problem, search for a local or county mental health care center. They may be able to offer public mental health care services at low or no cost when you are not able to see a private health care provider. If you are taking medicine for depression, you may be able to get the generic form, which may be less expensive. Some makers of prescription medicines also offer help to patients who cannot afford the medicines they need. Follow these instructions at home:   Get the right amount and quality of sleep.  Cut down on using caffeine, tobacco, alcohol, and other potentially harmful substances.  Try to exercise, such as walking or lifting small weights.  Take over-the-counter and prescription medicines only as told by your health care provider.  Eat a healthy diet that includes plenty of vegetables, fruits, whole grains, low-fat dairy products, and lean protein. Do not eat a lot of foods that are high in solid fats, added sugars, or salt.    Keep all follow-up visits as told by your health care provider. This is important. Contact a health care provider if:  You stop taking your antidepressant medicines, and you have any of these symptoms: ? Nausea. ? Headache. ? Feeling lightheaded. ? Chills and body aches. ? Not being able to sleep (insomnia).  You or your friends and family think your depression is getting worse. Get help right away if:  You have thoughts of hurting yourself or others. If you ever feel like you may hurt yourself or others, or have thoughts about taking your own life, get help right away. You can go to your nearest emergency department or call:  Your local emergency services (911 in the [place]  A suicide crisis helpline, such as the  National Suicide Prevention Lifeline at 1-800-273-8255. This is open 24-hours a day. Summary  If you are living with depression, there are ways to help you recover from it and also ways to prevent it from coming back.  Work with your health care team to create a management plan that includes counseling, stress management techniques, and healthy lifestyle habits. This information is not intended to replace advice given to you by your health care provider. Make sure you discuss any questions you have with your health care provider. Document Revised: 07/06/2018 Document Reviewed: 02/15/2016 Elsevier Patient Education  2020 [employer] Inc[employer]  

## 2019-09-25 NOTE — Assessment & Plan Note (Signed)
Acute x 2 weeks.  She refuses imaging today.  Will send in scripts for Prednisone and Zpack.  Recommend she obtain OTC Mucinex for cough or may use Coricidin for symptoms management.  Recommend completed cessation of smoking and discussed at length benefit of obtaining yearly lung CT screening, provided her pamphlet on this and recommended.  She wishes to think about CT screening and will plan on obtaining in future she reports.  To return to office for any worsening or ongoing symptoms.

## 2019-09-25 NOTE — Assessment & Plan Note (Addendum)
Chronic, ongoing.  Continue Stiolto and Albuterol.  Recommend complete smoking cessation, start Wellbutrin for mood, which may also benefit with smoking cessation.  Refuses lung CT CA screening at this time, but provided information on this.  She does not wish to pursue pulmonary referral at this time, but may benefit from in future.  Return in 3 months.

## 2019-09-25 NOTE — Assessment & Plan Note (Signed)
Has been cutting back on use with Wellbutrin on board, currently no use in 14 days.  Praised for reduction of use and recommend complete cessation.  Suspect this is reason BP is trending downwards, discussed with her, as is using less cocaine and alcohol.

## 2019-09-25 NOTE — Assessment & Plan Note (Signed)
BMI 27.46.  Recommended eating smaller high protein, low fat meals more frequently and exercising 30 mins a day 5 times a week with a goal of 10-15lb weight loss in the next 3 months. Patient voiced their understanding and motivation to adhere to these recommendations.

## 2019-09-25 NOTE — Assessment & Plan Note (Signed)
Ongoing, but improved with Wellbutrin on board.  Denies SI/HI.  PHQ dropped from 10 to now 3 + she has reduced cocaine and alcohol use.  Continue current medication regimen and adjust as needed.  Refills sent in.  To alert provider or go immediately to ER if SI/HI presents.  Return in 3 months.

## 2019-10-18 ENCOUNTER — Telehealth: Payer: Self-pay | Admitting: Pharmacist

## 2019-10-18 ENCOUNTER — Telehealth: Payer: Self-pay

## 2019-10-18 NOTE — Telephone Encounter (Signed)
  Chronic Care Management   Outreach Note  10/18/2019 Name: Kristen Mills MRN: [id number] DOB: 10-09-1950  Referred by: [name], NP Reason for referral : Medication Management (Medication Management)   Attempted to contact patient for medication management review. Left HIPAA compliant message for patient to return my call at their convenience.   Plan: - Will collaborate with Care Guide to outreach to schedule follow up with me  [name], PharmD, [place] [phone]

## 2019-10-21 ENCOUNTER — Telehealth: Payer: Self-pay

## 2019-10-21 NOTE — Chronic Care Management (AMB) (Signed)
  Care Management   Note  10/21/2019 Name: Kristen Mills MRN: [id number] DOB: [birth year]  Kristen Mills is a 69 y.o. year old female who is a primary care patient of [name], NP and is actively engaged with the care management team. I reached out to [name] by phone today to assist with re-scheduling a follow up visit with the Pharmacist  Follow up plan: Telephone appointment with care management team member scheduled for:11/29/2019  Noreene Larsson, [place], [place], [place] [address] Direct Dial: [phone] [email or web address]: [email or web address]

## 2019-11-12 ENCOUNTER — Other Ambulatory Visit: Payer: Self-pay | Admitting: Nurse Practitioner

## 2019-11-12 NOTE — Telephone Encounter (Signed)
Received voicemail from patient worrying about receiving these refills. If someone could call her to let her know that these were sent, I would appreciate it!

## 2019-11-12 NOTE — Telephone Encounter (Signed)
Patient notified. While on the phone, patient states that her inhalers will run out soon and that the pharmacist usually orders these for her through patient assistance. Looked in the patient's chart and see documentation that the patient is supposed to call to request refills of inhalers when opening last box. Do you by any chance have the phone number that the patient needs to call for BI?

## 2019-11-13 ENCOUNTER — Telehealth: Payer: Self-pay

## 2019-11-13 NOTE — Telephone Encounter (Signed)
Cannot route encounter to [place] for assistance with this. Sent staff message instead and will wait for her response.

## 2019-11-13 NOTE — Telephone Encounter (Signed)
-----   Message from Vladimir Faster, [facility] sent at 11/13/2019 11:49 AM EDT ----- Elsworth Soho, Yes, that is correct. She needs to call [phone].  Catie put the number for most patients in the blue "sticky note" to save you some trouble next time:)  ----- Message ----- From: Georgina Peer, CMA Sent: 11/13/2019  10:01 AM EDT To: Vladimir Faster, [place]. I have been trying to route you a patient call for this patient but it is not going through. The patient says she is on her last inhalers and gets them through patient assistance. She said that Joellen Jersey has helped her with getting refills in the past. I looked in her chart and it looks like she is supposed to contact BI for refills, is that correct? If so, do you have the number she needs to call?  Thanks, [place]

## 2019-11-13 NOTE — Telephone Encounter (Signed)
See other phone encounter that is connected.

## 2019-11-13 NOTE — Telephone Encounter (Signed)
Tried calling patient to let her know of the number to call for BI. Call rang and then went to a busy signal. Will try to call again later.

## 2019-11-21 ENCOUNTER — Encounter: Payer: Self-pay | Admitting: Unknown Physician Specialty

## 2019-11-21 ENCOUNTER — Ambulatory Visit (INDEPENDENT_AMBULATORY_CARE_PROVIDER_SITE_OTHER): Payer: PPO | Admitting: Unknown Physician Specialty

## 2019-11-21 ENCOUNTER — Other Ambulatory Visit: Payer: Self-pay

## 2019-11-21 VITALS — BP 141/89 | HR 87 | Temp 98.3°F | Wt 142.0 lb

## 2019-11-21 DIAGNOSIS — L723 Sebaceous cyst: Secondary | ICD-10-CM | POA: Diagnosis not present

## 2019-11-21 NOTE — Progress Notes (Signed)
BP (!) 141/89 (BP Location: Left Arm, Patient Position: Sitting, Cuff Size: Normal)   Pulse 87   Temp 98.3 F (36.8 C) (Oral)   Wt 142 lb (64.4 kg)   SpO2 95%   BMI 27.73 kg/m    Subjective:    Patient ID: Kristen Mills, female    DOB: Dec 29, 1950, 69 y.o.   MRN: [id number]  HPI: Kristen Mills is a 69 y.o. female  Chief Complaint  Patient presents with  . Cyst    on her head   Pt with history of sebaceous cysts on scalp.  She comes today with a large cyst posterior scalp which has been there for years.  Recently it is draining and worries about washing her hair and removing a scab. No fever or inflammation.  Denies headache or dizzyness.  Due for a f/u visit soon to address chronic medical conditions  Relevant past medical, surgical, family and social history reviewed and updated as indicated. Interim medical history since our last visit reviewed. Allergies and medications reviewed and updated.  Review of Systems  Per HPI unless specifically indicated above     Objective:    BP (!) 141/89 (BP Location: Left Arm, Patient Position: Sitting, Cuff Size: Normal)   Pulse 87   Temp 98.3 F (36.8 C) (Oral)   Wt 142 lb (64.4 kg)   SpO2 95%   BMI 27.73 kg/m   Wt Readings from Last 3 Encounters:  11/21/19 142 lb (64.4 kg)  09/25/19 140 lb 9.6 oz (63.8 kg)  08/14/19 137 lb (62.1 kg)    Physical Exam Constitutional:      General: She is not in acute distress.    Appearance: Normal appearance. She is well-developed.  HENT:     Head: Normocephalic and atraumatic.  Eyes:     General: Lids are normal. No scleral icterus.       Right eye: No discharge.        Left eye: No discharge.     Conjunctiva/sclera: Conjunctivae normal.  Cardiovascular:     Rate and Rhythm: Normal rate.  Pulmonary:     Effort: Pulmonary effort is normal.  Abdominal:     Palpations: There is no hepatomegaly or splenomegaly.  Musculoskeletal:        General: Normal range of motion.  Skin:     Coloration: Skin is not pale.     Findings: No rash.     Comments: Large cyst back of scalp.  No drainage with lancing and hemostat exploration.  Purulent drainage noted base of cyst.    Neurological:     Mental Status: She is alert and oriented to person, place, and time.  Psychiatric:        Behavior: Behavior normal.        Thought Content: Thought content normal.        Judgment: Judgment normal.    Cyst on scalp cleaned with betadine and alcohol.  Small amount of hair cut.  Incision made with #11 blade.  Unable to express drainage.  Pt ed on infection and to RTC with any drainage or erythema.    Results for orders placed or performed in visit on 12/11/18  Lipid Panel w/o Chol/HDL Ratio  Result Value Ref Range   Cholesterol, Total 139 100 - 199 mg/dL   Triglycerides 152 (H) 0 - 149 mg/dL   HDL 67 >39 mg/dL   VLDL Cholesterol Cal 25 5 - 40 mg/dL   LDL Chol Calc (NIH) 47  0 - 99 mg/dL  VITAMIN D 25 Hydroxy (Vit-D Deficiency, Fractures)  Result Value Ref Range   Vit D, 25-Hydroxy 34.4 30.0 - 100.0 ng/mL  Comp Met (CMET)  Result Value Ref Range   Glucose 122 (H) 65 - 99 mg/dL   BUN 9 8 - 27 mg/dL   Creatinine, Ser 1.10 (H) 0.57 - 1.00 mg/dL   GFR calc non Af Amer 52 (L) >59 mL/min/1.73   GFR calc Af Amer 60 >59 mL/min/1.73   BUN/Creatinine Ratio 8 (L) 12 - 28   Sodium 141 134 - 144 mmol/L   Potassium 4.2 3.5 - 5.2 mmol/L   Chloride 102 96 - 106 mmol/L   CO2 25 20 - 29 mmol/L   Calcium 9.5 8.7 - 10.3 mg/dL   Total Protein 6.9 6.0 - 8.5 g/dL   Albumin 4.5 3.8 - 4.8 g/dL   Globulin, Total 2.4 1.5 - 4.5 g/dL   Albumin/Globulin Ratio 1.9 1.2 - 2.2   Bilirubin Total 0.6 0.0 - 1.2 mg/dL   Alkaline Phosphatase 101 39 - 117 IU/L   AST 25 0 - 40 IU/L   ALT 18 0 - 32 IU/L      Assessment & Plan:   Problem List Items Addressed This Visit    None    Visit Diagnoses    Sebaceous cyst    -  Primary   Unsuccessful I&D.  Will refer to surgery   Relevant Orders   Ambulatory  referral to General Surgery       Follow up plan: Return with surgery.

## 2019-11-21 NOTE — Telephone Encounter (Signed)
Called and provided the phone number to the patient.

## 2019-11-28 ENCOUNTER — Ambulatory Visit: Payer: Self-pay | Admitting: Surgery

## 2019-11-29 ENCOUNTER — Telehealth: Payer: PPO

## 2019-11-29 ENCOUNTER — Telehealth: Payer: Self-pay | Admitting: Pharmacist

## 2019-11-29 NOTE — Progress Notes (Signed)
Chronic Care Management Pharmacy Assistant   Name: TORRI MICHALSKI  MRN: [id number] DOB: [birth year]  Reason for Encounter: Disease State  Patient Questions:  1.  Have you seen any other providers since your last visit? No  2.  Any changes in your medicines or health? No   PCP : Marnee Guarneri T, NP  Allergies:  No Known Allergies  Medications: Outpatient Encounter Medications as of 11/29/2019  Medication Sig Note   albuterol (VENTOLIN HFA) 108 (90 Base) MCG/ACT inhaler INHALE 2 PUFFS EVERY FOUR TO SIX HOURS AS NEEDED FOR SHORTNESS OF BREATH. 05/07/2019: At least twice daily   amLODipine (NORVASC) 5 MG tablet Take 1 tablet (5 mg total) by mouth daily.    atorvastatin (LIPITOR) 10 MG tablet Take 1 tablet (10 mg total) by mouth daily at 6 PM.    buPROPion (WELLBUTRIN XL) 150 MG 24 hr tablet Take 300 MG (2 tablets) once daily by mouth.    Cholecalciferol (VITAMIN D3) 1.25 MG (50000 UT) CAPS TAKE 1 CAPSULE BY MOUTH ONCE A WEEK FOR 8 WEEKS THEN STOP. RETURN TO OFFICE FOR LAB DRAW. (Patient not taking: Reported on 11/21/2019)    gabapentin (NEURONTIN) 300 MG capsule TAKE 1 CAPSULE BY MOUTH 3 TIMES A DAY    hydrOXYzine (ATARAX/VISTARIL) 10 MG tablet TAKE 1 TABLET BY MOUTH TWICE A DAY AS NEEDED    Tiotropium Bromide-Olodaterol 2.5-2.5 MCG/ACT AERS Inhale 2 puffs into the lungs daily.    traZODone (DESYREL) 50 MG tablet TAKE ONE TABLET BY MOUTH AT BEDTIME    No facility-administered encounter medications on file as of 11/29/2019.    Current Diagnosis: Patient Active Problem List   Diagnosis Date Noted   COPD exacerbation ([place]) 09/25/2019   Financial difficulties 08/14/2019   CKD (chronic kidney disease) stage 3, GFR 30-59 ml/min 08/14/2019   BMI 27.0-27.9,adult 08/14/2019   Vitamin D deficiency 12/11/2018   Simple chronic bronchitis ([place]) 08/15/2018   Cocaine abuse ([place]) 08/15/2018   Depression, major, recurrent, moderate ([place]) 08/15/2018   Elevated hemoglobin A1c  02/23/2017   Chronic hepatitis C without hepatic coma ([place]) 11/03/2015   Hyperlipemia 04/09/2015   ETOH abuse 12/30/2014   Insomnia 12/30/2014   Spinal stenosis 11/20/2014   Hypertension 08/20/2014   Nicotine dependence, cigarettes, w unsp disorders 09/24/2013    Goals Addressed   None     Follow-Up:  Pharmacist Review    Current COPD regimen: Albuterol 108 (90 Base), Tiotropium Bromide- Olodaterol 2.5-.2.5 mcg   Any recent hospitalizations or ED visits since last visit with CPP? No  Reports denies COPD symptoms, including Increased shortness of breath   What recent interventions/DTPs have been made by any provider to improve breathing since last visit:  Have you had exacerbation/flare-up since last visit? No  What do you do when you are short of breath?  Adhere to COPD Action Plan  Respiratory Devices/Equipment  Do you have a nebulizer? No  Do you use a Peak Flow Meter? Yes  Do you use a maintenance inhaler? Yes  How often do you forget to use your daily inhaler? Patient stated that she uses her daily inhaler as prescribed.   Do you use a rescue inhaler? Yes  How often do you use your rescue inhaler?  prn  Do you use a spacer with your inhaler? No  Adherence Review:  Does the patient have >5 day gap between last estimated fill date for maintenance inhaler medications? No      Rosendo Gros, CMA  Clinical  Pharmacist Assistant  [phone]

## 2019-12-05 ENCOUNTER — Other Ambulatory Visit: Payer: Self-pay | Admitting: Nurse Practitioner

## 2019-12-05 MED ORDER — ALBUTEROL SULFATE HFA 108 (90 BASE) MCG/ACT IN AERS: INHALATION_SPRAY | RESPIRATORY_TRACT | 5 refills | Status: DC

## 2019-12-25 ENCOUNTER — Ambulatory Visit: Payer: PPO | Admitting: Nurse Practitioner

## 2020-01-08 ENCOUNTER — Other Ambulatory Visit: Payer: Self-pay | Admitting: Nurse Practitioner

## 2020-01-08 MED ORDER — AMLODIPINE BESYLATE 5 MG PO TABS: 5.0000 mg | ORAL_TABLET | Freq: Every day | ORAL | 4 refills | Status: DC

## 2020-01-08 MED ORDER — VITAMIN D3 1.25 MG (50000 UT) PO CAPS: ORAL_CAPSULE | ORAL | 0 refills | Status: DC

## 2020-01-08 NOTE — Telephone Encounter (Signed)
PT need a refill  amLODipine (NORVASC) 5 MG tablet [[id number]]  MEDICAL [address] [place], [place] - [place]  [place] [address]  Phone: [phone] Fax: [phone]

## 2020-01-08 NOTE — Telephone Encounter (Signed)
Requested medication (s) are due for refill today: yes   Requested medication (s) are on the active medication list: yes   Last refill:  08/15/2018 #90 3 refills  Future visit scheduled: yes in 1 week   Notes to clinic:  expired     Requested Prescriptions  Pending Prescriptions Disp Refills   amLODipine (NORVASC) 5 MG tablet 90 tablet 3    Sig: Take 1 tablet (5 mg total) by mouth daily.      Cardiovascular:  Calcium Channel Blockers Failed - 01/08/2020  1:19 PM      Failed - Last BP in normal range    BP Readings from Last 1 Encounters:  11/21/19 (!) 141/89          Passed - Valid encounter within last 6 months    Recent Outpatient Visits           1 month ago Sebaceous cyst   [place], NP   3 months ago COPD exacerbation [facility])   [place], Casa T, NP   4 months ago Depression, major, recurrent, moderate ([place])   [place], Jolene T, NP   11 months ago Simple chronic bronchitis ([place])   [place], Henrine Screws T, NP   1 year ago Mixed hyperlipidemia   [place], Barbaraann Faster, NP       Future Appointments             In 1 week Cannady, Barbaraann Faster, NP [facility]

## 2020-01-08 NOTE — Telephone Encounter (Signed)
PT need a refill  Cholecalciferol (VITAMIN D3) 1.25 MG (50000 UT) CAPS [[id number]]  MEDICAL [address] [place], [place] - [place]  [place] [address]  Phone: [phone] Fax: [phone]

## 2020-01-08 NOTE — Telephone Encounter (Signed)
Requested medication (s) are due for refill today: Yes  Requested medication (s) are on the active medication list: Yes  Last refill:  05/07/19  Future visit scheduled: Yes  Notes to clinic:  See request.    Requested Prescriptions  Pending Prescriptions Disp Refills   Cholecalciferol (VITAMIN D3) 1.25 MG (50000 UT) CAPS 10 capsule 0    Sig: TAKE 1 CAPSULE BY MOUTH ONCE A WEEK FOR 8 WEEKS THEN STOP. RETURN TO OFFICE FOR LAB DRAW.      Endocrinology:  Vitamins - Vitamin D Supplementation Failed - 01/08/2020 12:39 PM      Failed - 50,000 IU strengths are not delegated      Failed - Ca in normal range and within 360 days    Calcium  Date Value Ref Range Status  12/11/2018 9.5 8.7 - 10.3 mg/dL Final   Calcium, Total  Date Value Ref Range Status  02/08/2012 9.6 8.5 - 10.1 mg/dL Final          Failed - Phosphate in normal range and within 360 days    No results found for: PHOS        Failed - Vitamin D in normal range and within 360 days    Vit D, 25-Hydroxy  Date Value Ref Range Status  12/11/2018 34.4 30.0 - 100.0 ng/mL Final    Comment:    Vitamin D deficiency has been defined by the Institute of Medicine and an Endocrine Society practice guideline as a level of serum 25-OH vitamin D less than 20 ng/mL (1,2). The Endocrine Society went on to further define vitamin D insufficiency as a level between 21 and 29 ng/mL (2). 1. IOM (Institute of Medicine). 2010. Dietary reference    intakes for calcium and D. [place]: The    [employer]. 2. Holick MF, Binkley [place], Bischoff-Ferrari HA, et al.    Evaluation, treatment, and prevention of vitamin D    deficiency: an Endocrine Society clinical practice    guideline. JCEM. 2011 Jul; 96(7):1911-30.           Passed - Valid encounter within last 12 months    Recent Outpatient Visits           1 month ago Sebaceous cyst   [facility] Kristen Haddock, NP   3 months ago COPD exacerbation [facility])    [place], [place] T, NP   4 months ago Depression, major, recurrent, moderate ([place])   [place], Kristen T, NP   11 months ago Simple chronic bronchitis ([place])   [place], Kristen Faster, NP   1 year ago Mixed hyperlipidemia   [place], Kristen Faster, NP       Future Appointments             In 1 week Cannady, Kristen Faster, NP [facility]

## 2020-01-09 ENCOUNTER — Encounter: Payer: Self-pay | Admitting: *Deleted

## 2020-01-21 ENCOUNTER — Encounter: Payer: Self-pay | Admitting: Nurse Practitioner

## 2020-01-21 ENCOUNTER — Ambulatory Visit (INDEPENDENT_AMBULATORY_CARE_PROVIDER_SITE_OTHER): Payer: PPO | Admitting: Nurse Practitioner

## 2020-01-21 ENCOUNTER — Other Ambulatory Visit: Payer: Self-pay

## 2020-01-21 VITALS — BP 110/64 | HR 98 | Temp 97.9°F | Resp 16 | Ht 60.0 in | Wt 147.8 lb

## 2020-01-21 DIAGNOSIS — Z23 Encounter for immunization: Secondary | ICD-10-CM | POA: Diagnosis not present

## 2020-01-21 DIAGNOSIS — F1410 Cocaine abuse, uncomplicated: Secondary | ICD-10-CM | POA: Diagnosis not present

## 2020-01-21 DIAGNOSIS — N1831 Chronic kidney disease, stage 3a: Secondary | ICD-10-CM

## 2020-01-21 DIAGNOSIS — Z6827 Body mass index (BMI) 27.0-27.9, adult: Secondary | ICD-10-CM | POA: Diagnosis not present

## 2020-01-21 DIAGNOSIS — I10 Essential (primary) hypertension: Secondary | ICD-10-CM | POA: Diagnosis not present

## 2020-01-21 DIAGNOSIS — E782 Mixed hyperlipidemia: Secondary | ICD-10-CM | POA: Diagnosis not present

## 2020-01-21 DIAGNOSIS — J410 Simple chronic bronchitis: Secondary | ICD-10-CM

## 2020-01-21 DIAGNOSIS — R7309 Other abnormal glucose: Secondary | ICD-10-CM

## 2020-01-21 DIAGNOSIS — F331 Major depressive disorder, recurrent, moderate: Secondary | ICD-10-CM

## 2020-01-21 DIAGNOSIS — F17219 Nicotine dependence, cigarettes, with unspecified nicotine-induced disorders: Secondary | ICD-10-CM

## 2020-01-21 MED ORDER — ATORVASTATIN CALCIUM 10 MG PO TABS: 10.0000 mg | ORAL_TABLET | Freq: Every day | ORAL | 4 refills | Status: DC

## 2020-01-21 MED ORDER — BUPROPION HCL ER (XL) 150 MG PO TB24: ORAL_TABLET | ORAL | 12 refills | Status: DC

## 2020-01-21 NOTE — Assessment & Plan Note (Signed)
Chronic, ongoing.  Repeat BMP today.  No current ACE or ARB, due to severe COPD would lean towards ARB if addition of kidney protection agreed upon with patient.  If worsening function will refer to nephrology.  Urine ALB next visit.

## 2020-01-21 NOTE — Assessment & Plan Note (Signed)
I have recommended complete cessation of tobacco use. She continues on Wellbutrin, which has offered benefit.  Refuses CT lung screening.

## 2020-01-21 NOTE — Patient Instructions (Signed)
Chronic Obstructive Pulmonary Disease Chronic obstructive pulmonary disease (COPD) is a long-term (chronic) lung problem. When you have COPD, it is hard for air to get in and out of your lungs. Usually the condition gets worse over time, and your lungs will never return to normal. There are things you can do to keep yourself as healthy as possible.  Your doctor may treat your condition with: ? Medicines. ? Oxygen. ? Lung surgery.  Your doctor may also recommend: ? Rehabilitation. This includes steps to make your body work better. It may involve a team of specialists. ? Quitting smoking, if you smoke. ? Exercise and changes to your diet. ? Comfort measures (palliative care). Follow these instructions at home: Medicines  Take over-the-counter and prescription medicines only as told by your doctor.  Talk to your doctor before taking any cough or allergy medicines. You may need to avoid medicines that cause your lungs to be dry. Lifestyle  If you smoke, stop. Smoking makes the problem worse. If you need help quitting, ask your doctor.  Avoid being around things that make your breathing worse. This may include smoke, chemicals, and fumes.  Stay active, but remember to rest as well.  Learn and use tips on how to relax.  Make sure you get enough sleep. Most adults need at least 7 hours of sleep every night.  Eat healthy foods. Eat smaller meals more often. Rest before meals. Controlled breathing Learn and use tips on how to control your breathing as told by your doctor. Try:  Breathing in (inhaling) through your nose for 1 second. Then, pucker your lips and breath out (exhale) through your lips for 2 seconds.  Putting one hand on your belly (abdomen). Breathe in slowly through your nose for 1 second. Your hand on your belly should move out. Pucker your lips and breathe out slowly through your lips. Your hand on your belly should move in as you breathe out.  Controlled coughing Learn  and use controlled coughing to clear mucus from your lungs. Follow these steps: 1. Lean your head a little forward. 2. Breathe in deeply. 3. Try to hold your breath for 3 seconds. 4. Keep your mouth slightly open while coughing 2 times. 5. Spit any mucus out into a tissue. 6. Rest and do the steps again 1 or 2 times as needed. General instructions  Make sure you get all the shots (vaccines) that your doctor recommends. Ask your doctor about a flu shot and a pneumonia shot.  Use oxygen therapy and pulmonary rehabilitation if told by your doctor. If you need home oxygen therapy, ask your doctor if you should buy a tool to measure your oxygen level (oximeter).  Make a COPD action plan with your doctor. This helps you to know what to do if you feel worse than usual.  Manage any other conditions you have as told by your doctor.  Avoid going outside when it is very hot, cold, or humid.  Avoid people who have a sickness you can catch (contagious).  Keep all follow-up visits as told by your doctor. This is important. Contact a doctor if:  You cough up more mucus than usual.  There is a change in the color or thickness of the mucus.  It is harder to breathe than usual.  Your breathing is faster than usual.  You have trouble sleeping.  You need to use your medicines more often than usual.  You have trouble doing your normal activities such as getting dressed   or walking around the house. Get help right away if:  You have shortness of breath while resting.  You have shortness of breath that stops you from: ? Being able to talk. ? Doing normal activities.  Your chest hurts for longer than 5 minutes.  Your skin color is more blue than usual.  Your pulse oximeter shows that you have low oxygen for longer than 5 minutes.  You have a fever.  You feel too tired to breathe normally. Summary  Chronic obstructive pulmonary disease (COPD) is a long-term lung problem.  The way your  lungs work will never return to normal. Usually the condition gets worse over time. There are things you can do to keep yourself as healthy as possible.  Take over-the-counter and prescription medicines only as told by your doctor.  If you smoke, stop. Smoking makes the problem worse. This information is not intended to replace advice given to you by your health care provider. Make sure you discuss any questions you have with your health care provider. Document Revised: 02/24/2017 Document Reviewed: 04/18/2016 Elsevier Patient Education  2020 Elsevier Inc.  

## 2020-01-21 NOTE — Assessment & Plan Note (Signed)
Chronic, ongoing.  Continue current medication regimen and adjust as needed. Lipid panel today. 

## 2020-01-21 NOTE — Assessment & Plan Note (Signed)
Has been cutting back on use with Wellbutrin on board, has gone 25 days without use.  Praised for reduction of use and recommend complete cessation.  Suspect this is reason BP is trending downwards, discussed with her, as is using less cocaine and alcohol.

## 2020-01-21 NOTE — Assessment & Plan Note (Signed)
Check A1C today and initiate medication as needed if appropriate.

## 2020-01-21 NOTE — Progress Notes (Signed)
BP 110/64 (BP Location: Left Arm, Patient Position: Sitting, Cuff Size: Normal)   Pulse 98   Temp 97.9 F (36.6 C) (Oral)   Resp 16   Ht 5' (1.524 m)   Wt 147 lb 12.8 oz (67 kg)   SpO2 99%   BMI 28.87 kg/m    Subjective:    Patient ID: Kristen Mills, female    DOB: 02-29-1951, 69 y.o.   MRN: [id number]  HPI: Kristen Mills is a 69 y.o. female  Chief Complaint  Patient presents with  . COPD  . Hypertension  . Hyperlipidemia  . Depression   HYPERTENSION / HYPERLIPIDEMIA Continues on Amlodipine daily and Atorvastatin.  A1c in August 2020 was 5.9%, denies any symptoms. Satisfied with current treatment?yes Duration of hypertension:chronic BP monitoring frequency:not checking BP range:  BP medication side effects:no Duration of hyperlipidemia:chronic Cholesterol supplements: none Aspirin:no Recent stressors:no Recurrent headaches:no Visual changes:no Palpitations:no Dyspnea:no Chest pain:no Lower extremity edema:no Dizzy/lightheaded:no The 10-year ASCVD risk score [name] DC Jr., et al., 2013) is: 17.6%   Values used to calculate the score:     Age: 37 years     Sex: Female     Is Non-Hispanic African American: No     Diabetic: Yes     Tobacco smoker: Yes     Systolic Blood Pressure: 903 mmHg     Is BP treated: Yes     HDL Cholesterol: 67 mg/dL     Total Cholesterol: 139 mg/dL   CHRONIC KIDNEY DISEASE Recent CRT 1.10 and GFR 52, no current ACE or ARB. CKD status: stable Medications renally dose: yes Previous renal evaluation: no Pneumovax:  Up to Date Influenza Vaccine:  Up to Date   DEPRESSION She continues on Wellbutrin which has greatly improved mood and helped with reduction in cocaine use and smoking.  Continues to use cocaine, has gone 25 straight days without (last time used was one week ago), at baseline was using three or more times a week.  States she uses it due to making her feel good, but has been using less due to Wellbutrin.   Refuses rehab, therapy, or psychiatry referral. Has history of meal disparity, but this has improved. Mood status: exacerbated Satisfied with current treatment?: yes Symptom severity: moderate  Duration of current treatment : chronic Side effects: no Medication compliance: good compliance Psychotherapy/counseling: none Depressed mood: yes Anxious mood: yes Anhedonia: no Significant weight loss or gain: no Insomnia: none Fatigue: no Feelings of worthlessness or guilt: no Impaired concentration/indecisiveness: yes Suicidal ideations: no Hopelessness: no Crying spells: no Depression screen [facility] 2/9 01/21/2020 09/25/2019 08/14/2019 08/14/2019 11/12/2018  Decreased Interest 0 '1 2 1 ' 0  Down, Depressed, Hopeless 0 '1 2 1 ' 0  PHQ - 2 Score 0 '2 4 2 ' 0  Altered sleeping 1 0 - 2 2  Tired, decreased energy 1 1 - 2 3  Change in appetite 0 0 - 1 0  Feeling bad or failure about yourself  0 0 - 2 0  Trouble concentrating 0 0 - 1 0  Moving slowly or fidgety/restless 0 0 - 0 0  Suicidal thoughts 0 0 - 0 0  PHQ-9 Score 2 3 - 10 5  Difficult doing work/chores Not difficult at all Not difficult at all - Somewhat difficult -    COPD Continues on Dulera and Albuterol.  Continues to smoke, about 1/2 PPD, has been smoking since age 5.  Discussed lung CT screening last, but does not wish to obtain at  this time. She states if she had lung CA she would not want any kind of treatment.  Works with [employer] team. COPD status:stable Satisfied with current treatment?:yes Oxygen use:no Dyspnea frequency:occasional Rescue inhaler frequency:sometimes once a day Limitation of activity:no Productive cough: none Last Spirometry:August 2020 Pneumovax:Up to Date Influenza:Up to Date  Relevant past medical, surgical, family and social history reviewed and updated as indicated. Interim medical history since our last visit reviewed. Allergies and medications reviewed and updated.  Review of Systems    Constitutional: Negative for activity change, appetite change, diaphoresis, fatigue and fever.  Respiratory: Positive for shortness of breath (occasional at baseline) and wheezing (occasional at baseline). Negative for cough and chest tightness.   Cardiovascular: Negative for chest pain, palpitations and leg swelling.  Gastrointestinal: Negative.   Neurological: Negative for dizziness, syncope, weakness, light-headedness, numbness and headaches.  Psychiatric/Behavioral: Negative for decreased concentration, self-injury, sleep disturbance and suicidal ideas. The patient is not nervous/anxious.     Per HPI unless specifically indicated above     Objective:    BP 110/64 (BP Location: Left Arm, Patient Position: Sitting, Cuff Size: Normal)   Pulse 98   Temp 97.9 F (36.6 C) (Oral)   Resp 16   Ht 5' (1.524 m)   Wt 147 lb 12.8 oz (67 kg)   SpO2 99%   BMI 28.87 kg/m   Wt Readings from Last 3 Encounters:  01/21/20 147 lb 12.8 oz (67 kg)  11/21/19 142 lb (64.4 kg)  09/25/19 140 lb 9.6 oz (63.8 kg)    Physical Exam Vitals and nursing note reviewed.  Constitutional:      General: She is awake. She is not in acute distress.    Appearance: She is well-developed and overweight. She is not ill-appearing.  HENT:     Head: Normocephalic.     Right Ear: Hearing normal.     Left Ear: Hearing normal.  Eyes:     General: Lids are normal.        Right eye: No discharge.        Left eye: No discharge.     Conjunctiva/sclera: Conjunctivae normal.     Pupils: Pupils are equal, round, and reactive to light.  Neck:     Thyroid: No thyromegaly.     Vascular: No carotid bruit.  Cardiovascular:     Rate and Rhythm: Normal rate and regular rhythm.     Heart sounds: Normal heart sounds. No murmur heard.  No gallop.   Pulmonary:     Effort: Pulmonary effort is normal.     Breath sounds: Decreased breath sounds present.     Comments: Clear throughout with diminished breath sounds, no wheezes or  rhonchi (baseline). Abdominal:     General: Bowel sounds are normal.     Palpations: Abdomen is soft.  Musculoskeletal:     Cervical back: Normal range of motion and neck supple.     Right lower leg: No edema.     Left lower leg: No edema.  Skin:    General: Skin is warm and dry.  Neurological:     Mental Status: She is alert and oriented to person, place, and time.  Psychiatric:        Attention and Perception: Attention normal.        Mood and Affect: Mood normal.        Speech: Speech normal.        Behavior: Behavior normal. Behavior is cooperative.  Thought Content: Thought content normal.     Comments: Much improved mood on exam today.     Results for orders placed or performed in visit on 12/11/18  Lipid Panel w/o Chol/HDL Ratio  Result Value Ref Range   Cholesterol, Total 139 100 - 199 mg/dL   Triglycerides 152 (H) 0 - 149 mg/dL   HDL 67 >39 mg/dL   VLDL Cholesterol Cal 25 5 - 40 mg/dL   LDL Chol Calc (NIH) 47 0 - 99 mg/dL  VITAMIN D 25 Hydroxy (Vit-D Deficiency, Fractures)  Result Value Ref Range   Vit D, 25-Hydroxy 34.4 30.0 - 100.0 ng/mL  Comp Met (CMET)  Result Value Ref Range   Glucose 122 (H) 65 - 99 mg/dL   BUN 9 8 - 27 mg/dL   Creatinine, Ser 1.10 (H) 0.57 - 1.00 mg/dL   GFR calc non Af Amer 52 (L) >59 mL/min/1.73   GFR calc Af Amer 60 >59 mL/min/1.73   BUN/Creatinine Ratio 8 (L) 12 - 28   Sodium 141 134 - 144 mmol/L   Potassium 4.2 3.5 - 5.2 mmol/L   Chloride 102 96 - 106 mmol/L   CO2 25 20 - 29 mmol/L   Calcium 9.5 8.7 - 10.3 mg/dL   Total Protein 6.9 6.0 - 8.5 g/dL   Albumin 4.5 3.8 - 4.8 g/dL   Globulin, Total 2.4 1.5 - 4.5 g/dL   Albumin/Globulin Ratio 1.9 1.2 - 2.2   Bilirubin Total 0.6 0.0 - 1.2 mg/dL   Alkaline Phosphatase 101 39 - 117 IU/L   AST 25 0 - 40 IU/L   ALT 18 0 - 32 IU/L      Assessment & Plan:   Problem List Items Addressed This Visit      Cardiovascular and Mediastinum   Hypertension    Chronic, stable with BP at  goal today.  Continue current medication regimen and adjust as needed.  Recommend checking BP at home 3 mornings a week and documenting for provider + DASH diet.  BMP today.  Return in 6 months for physical.       Relevant Medications   atorvastatin (LIPITOR) 10 MG tablet   Other Relevant Orders   TSH   Basic metabolic panel     Respiratory   Simple chronic bronchitis (HCC) - Primary    Chronic, ongoing.  Continue Stiolto and Albuterol.  Recommend complete smoking cessation, continue Wellbutrin which has helped with reduction.  Refuses lung CT CA screening at this time, but provided information on this.  She does not wish to pursue pulmonary referral at this time, but may benefit from in future.  Return in 6 months for physical and spirometry.        Nervous and Auditory   Nicotine dependence, cigarettes, w unsp disorders    I have recommended complete cessation of tobacco use. She continues on Wellbutrin, which has offered benefit.  Refuses CT lung screening.         Genitourinary   CKD (chronic kidney disease) stage 3, GFR 30-59 ml/min (HCC)    Chronic, ongoing.  Repeat BMP today.  No current ACE or ARB, due to severe COPD would lean towards ARB if addition of kidney protection agreed upon with patient.  If worsening function will refer to nephrology.  Urine ALB next visit.      Relevant Orders   Basic metabolic panel     Other   Hyperlipemia    Chronic, ongoing.  Continue current medication regimen and adjust  as needed.  Lipid panel today.      Relevant Medications   atorvastatin (LIPITOR) 10 MG tablet   Other Relevant Orders   Lipid Panel w/o Chol/HDL Ratio   Elevated hemoglobin A1c    Check A1C today and initiate medication as needed if appropriate.      Relevant Orders   HgB A1c   Cocaine abuse ([place])    Has been cutting back on use with Wellbutrin on board, has gone 25 days without use.  Praised for reduction of use and recommend complete cessation.  Suspect this  is reason BP is trending downwards, discussed with her, as is using less cocaine and alcohol.      Depression, major, recurrent, moderate ([place])    Ongoing, but improved with Wellbutrin on board.  Denies SI/HI.  She has reduced cocaine and alcohol use + smoking.  Continue current medication regimen and adjust as needed.  Refills sent in.  To alert provider or go immediately to ER if SI/HI presents.  Return in 6 months.      Relevant Medications   buPROPion (WELLBUTRIN XL) 150 MG 24 hr tablet   BMI 27.0-27.9,adult    Recommended eating smaller high protein, low fat meals more frequently and exercising 30 mins a day 5 times a week with a goal of 10-15lb weight loss in the next 3 months. Patient voiced their understanding and motivation to adhere to these recommendations.        Other Visit Diagnoses    Flu vaccine need       Relevant Orders   Flu Vaccine QUAD High Dose(Fluad) (Completed)       Follow up plan: Return in about 6 months (around 07/21/2020) for Annual physical with spirometry.

## 2020-01-21 NOTE — Assessment & Plan Note (Signed)
Recommended eating smaller high protein, low fat meals more frequently and exercising 30 mins a day 5 times a week with a goal of 10-15lb weight loss in the next 3 months. Patient voiced their understanding and motivation to adhere to these recommendations.  

## 2020-01-21 NOTE — Assessment & Plan Note (Signed)
Chronic, stable with BP at goal today.  Continue current medication regimen and adjust as needed.  Recommend checking BP at home 3 mornings a week and documenting for provider + DASH diet.  BMP today.  Return in 6 months for physical.

## 2020-01-21 NOTE — Assessment & Plan Note (Addendum)
Chronic, ongoing.  Continue Stiolto and Albuterol.  Recommend complete smoking cessation, continue Wellbutrin which has helped with reduction.  Refuses lung CT CA screening at this time, but provided information on this.  She does not wish to pursue pulmonary referral at this time, but may benefit from in future.  Return in 6 months for physical and spirometry.

## 2020-01-21 NOTE — Assessment & Plan Note (Signed)
Ongoing, but improved with Wellbutrin on board.  Denies SI/HI.  She has reduced cocaine and alcohol use + smoking.  Continue current medication regimen and adjust as needed.  Refills sent in.  To alert provider or go immediately to ER if SI/HI presents.  Return in 6 months. ?

## 2020-01-22 LAB — BASIC METABOLIC PANEL
BUN/Creatinine Ratio: 15 (ref 12–28)
BUN: 20 mg/dL (ref 8–27)
CO2: 22 mmol/L (ref 20–29)
Calcium: 9.7 mg/dL (ref 8.7–10.3)
Chloride: 103 mmol/L (ref 96–106)
Creatinine, Ser: 1.36 mg/dL — ABNORMAL HIGH (ref 0.57–1.00)
GFR calc Af Amer: 46 mL/min/{1.73_m2} — ABNORMAL LOW (ref >59)
GFR calc non Af Amer: 40 mL/min/{1.73_m2} — ABNORMAL LOW (ref >59)
Glucose: 117 mg/dL — ABNORMAL HIGH (ref 65–99)
Potassium: 4.9 mmol/L (ref 3.5–5.2)
Sodium: 138 mmol/L (ref 134–144)

## 2020-01-22 LAB — LIPID PANEL W/O CHOL/HDL RATIO
Cholesterol, Total: 186 mg/dL (ref 100–199)
HDL: 51 mg/dL (ref >39)
LDL Chol Calc (NIH): 94 mg/dL (ref 0–99)
Triglycerides: 244 mg/dL — ABNORMAL HIGH (ref 0–149)
VLDL Cholesterol Cal: 41 mg/dL — ABNORMAL HIGH (ref 5–40)

## 2020-01-22 LAB — HEMOGLOBIN A1C
Est. average glucose Bld gHb Est-mCnc: 120 mg/dL
Hgb A1c MFr Bld: 5.8 % — ABNORMAL HIGH (ref 4.8–5.6)

## 2020-01-22 LAB — TSH: TSH: 2.69 u[IU]/mL (ref 0.450–4.500)

## 2020-01-22 NOTE — Progress Notes (Signed)
Good morning, please let Jasamine know her labs have returned.  Cholesterol levels showed some mild elevation compared to last labs, please make sure you are taking your Atorvastatin daily.  I sent refills on this and we will recheck next visit.  Thyroid normal.  Diabetes testing continues to show some prediabetes, but no worsening, continue diet focus.  Kidney function continues to show kidney disease stage 3 -- a little worsening this check.  We may add on another blood pressure medication at very low dose next visit to help protect kidneys.  Drink lots of water at home and continue to cut back as you have been on other things.  We will recheck next visit.  If any questions let me know.  Have a great day!! Keep being awesome!!  Thank you for allowing me to participate in your care. Kindest regards, Sakari Alkhatib

## 2020-01-31 ENCOUNTER — Telehealth: Payer: Self-pay | Admitting: General Practice

## 2020-01-31 NOTE — Telephone Encounter (Signed)
  Chronic Care Management   Outreach Note  01/31/2020 Name: CHANISE HABECK MRN: [id number] DOB: 1951-02-18  Referred by: [name], NP Reason for referral : Chronic Care Management (RNCM Initial outreach for Chronic Disease Management and Care Coordination Needs)   An unsuccessful telephone outreach was attempted today. The patient was referred to the case management team for assistance with care management and care coordination.   Follow Up Plan: The care management team will reach out to the patient again over the next 30 to 60 days.   Noreene Larsson RN, MSN, [facility] Mobile: [phone]

## 2020-03-30 ENCOUNTER — Telehealth: Payer: Self-pay

## 2020-04-01 ENCOUNTER — Ambulatory Visit: Payer: Self-pay | Admitting: Pharmacist

## 2020-04-01 DIAGNOSIS — J410 Simple chronic bronchitis: Secondary | ICD-10-CM

## 2020-04-01 DIAGNOSIS — I10 Essential (primary) hypertension: Secondary | ICD-10-CM

## 2020-04-01 DIAGNOSIS — N1831 Chronic kidney disease, stage 3a: Secondary | ICD-10-CM

## 2020-04-01 DIAGNOSIS — F17219 Nicotine dependence, cigarettes, with unspecified nicotine-induced disorders: Secondary | ICD-10-CM

## 2020-04-01 NOTE — Chronic Care Management (AMB) (Signed)
Chronic Care Management Pharmacy  Name: Kristen Mills  MRN: [id number] DOB: 31-Dec-1950   Chief Complaint/ HPI  Kristen Mills,  70 y.o. , female presents for her Initial CCM visit with the clinical pharmacist In office.  PCP : [name], NP Patient Care Team: [name], NP as PCP - General (Nurse Practitioner)  Patient's chronic conditions include: Hypertension, Hyperlipidemia, COPD, Chronic Kidney Disease, Depression and Tobacco use Hepatitis C, cocaine abuse, alcohol abuse  Office Visits: 01/21/20- Jolene Cannady,NP- blood work- CKD, lipids up,   Subjective: I am taking care of myself again.  Objective: No Known Allergies  Medications: Outpatient Encounter Medications as of 04/01/2020  Medication Sig   albuterol (VENTOLIN HFA) 108 (90 Base) MCG/ACT inhaler INHALE 2 PUFFS EVERY FOUR TO SIX HOURS AS NEEDED FOR SHORTNESS OF BREATH.   amLODipine (NORVASC) 5 MG tablet Take 1 tablet (5 mg total) by mouth daily.   atorvastatin (LIPITOR) 10 MG tablet Take 1 tablet (10 mg total) by mouth daily at 6 PM.   buPROPion (WELLBUTRIN XL) 150 MG 24 hr tablet Take 300 MG (2 tablets) once daily by mouth.   Cholecalciferol (VITAMIN D3) 1.25 MG (50000 UT) CAPS TAKE 1 CAPSULE BY MOUTH ONCE A WEEK FOR 8 WEEKS THEN STOP. RETURN TO OFFICE FOR LAB DRAW.   gabapentin (NEURONTIN) 300 MG capsule TAKE 1 CAPSULE BY MOUTH 3 TIMES A DAY   hydrOXYzine (ATARAX/VISTARIL) 10 MG tablet TAKE 1 TABLET BY MOUTH TWICE A DAY AS NEEDED   Tiotropium Bromide-Olodaterol 2.5-2.5 MCG/ACT AERS Inhale 2 puffs into the lungs daily.   traZODone (DESYREL) 50 MG tablet TAKE ONE TABLET BY MOUTH AT BEDTIME   No facility-administered encounter medications on file as of 04/01/2020.    Wt Readings from Last 3 Encounters:  01/21/20 147 lb 12.8 oz (67 kg)  11/21/19 142 lb (64.4 kg)  09/25/19 140 lb 9.6 oz (63.8 kg)    Lab Results  Component Value Date   CREATININE 1.36 (H) 01/21/2020   BUN 20  01/21/2020   GFRNONAA 40 (L) 01/21/2020   GFRAA 46 (L) 01/21/2020   NA 138 01/21/2020   K 4.9 01/21/2020   CALCIUM 9.7 01/21/2020   CO2 22 01/21/2020     Current Diagnosis/Assessment:  SDOH Interventions   Flowsheet Row Most Recent Value  SDOH Interventions   Financial Strain Interventions Other (Comment)  [patient assistance]      Goals Addressed            This Visit's Progress    Pharmacy Care Plan       CARE PLAN ENTRY (see longitudinal plan of care for additional care plan information)  Current Barriers:   Chronic Disease Management support, education, and care coordination needs related to Hypertension, Hyperlipidemia, COPD, Chronic Kidney Disease, and Tobacco use h/o substance abuse   Hypertension BP Readings from Last 3 Encounters:  01/21/20 110/64  11/21/19 (!) 141/89  09/25/19 112/68    Pharmacist Clinical Goal(s): o Over the next 60 days, patient will work with PharmD and providers to achieve BP goal <130/80  Current regimen:  o Amlodipine 5 mg qd  Interventions: o Praised patient for decreased drug/alcohol and cigarette use.  o Provided diet and exercise counseling. o Will discuss addition of  medications to preserve kidney function with PCP  Patient self care activities - Over the next 60 days, patient will: o Check BP 2-3 times weekly, document, and provide at future appointments o Ensure daily salt intake < 2300  mg/day o Focus on diet and increasing exercise   COPD/Chronic bronchitis  Pharmacist Clinical Goal(s) o Over the next 60 days, patient will work with PharmD and providers to eliminate financial barriers, reduce symptoms and optimize quality of life   Current regimen:  o Stiolto 2 puffs daily o Albuterol 1-2 puffs q6h prn  Interventions: o Assisted patient in completing PAP  application for [place] for albuterol options o Assisted patient is scheduling COVID vaccination appointments (1st dose  tomorrow) o Encouraged smoking cessation and praised patient for cutting back. Will continue to offer assistance.  Patient self care activities - Over the next 60 days, patient will: o Contact PCP or PharmD if Stiolto sample needed prior to PAP received. o Continue focusing on self care with healthy meals, adequate sleep and exercise. o Avoid triggers.  Medication management  Pharmacist Clinical Goal(s): o Over the next 60 days, patient will work with PharmD and providers to achieve optimal medication adherence  Current pharmacy: [facility]  Interventions o Comprehensive medication review performed. o Continue current medication management strategy  Patient self care activities - Over the next 60 days, patient will: o Focus on medication adherence by fill dates o Take medications as prescribed o Report any questions or concerns to PharmD and/or provider(s)  Initial goal documentation       Hypertension/CKD   BP goal is:  <130/80  Office blood pressures are  BP Readings from Last 3 Encounters:  01/21/20 110/64  11/21/19 (!) 141/89  09/25/19 112/68   BMP Latest Ref Rng & Units 01/21/2020 12/11/2018 11/12/2018  Glucose 65 - 99 mg/dL 117(H) 122(H) 131(H)  BUN 8 - 27 mg/dL '20 9 10  ' Creatinine 0.57 - 1.00 mg/dL 1.36(H) 1.10(H) 1.09(H)  BUN/Creat Ratio 12 - 28 15 8(L) 9(L)  Sodium 134 - 144 mmol/L 138 141 137  Potassium 3.5 - 5.2 mmol/L 4.9 4.2 4.2  Chloride 96 - 106 mmol/L 103 102 100  CO2 20 - 29 mmol/L '22 25 23  ' Calcium 8.7 - 10.3 mg/dL 9.7 9.5 9.5    Patient checks BP at home infrequently Patient home BP readings are ranging: unknown  Patient has failed these meds in the past: NA Patient is currently controlled on the following medications:   Amlodipine 5 mg qd  We discussed: Patient has dramatically decreased cocaine and alcohol abuse which is likely contributing to reduction in BP. We discussed diet and exercise, avoiding salty foods.Encouraged  patient to increase water intake to prevent dehydration and kidney damage.  Patient does not follow with nephrology. Creatinine trending up. Consider addition of low dose ACEI/ ARB and potentially a SGLT-2 for renal protection. Could pursue patient assistance for this. Plan  Continue amlodipine. Consider addition of low-dose ACEI/ARB/SGLT-2 for prevention of CKD progression.    Hyperlipidemia   LDL goal < 70  Last lipids Lab Results  Component Value Date   CHOL 186 01/21/2020   HDL 51 01/21/2020   LDLCALC 94 01/21/2020   TRIG 244 (H) 01/21/2020   CHOLHDL 2.6 10/20/2015   Hepatic Function Latest Ref Rng & Units 12/11/2018 11/12/2018 06/19/2016  Total Protein 6.0 - 8.5 g/dL 6.9 6.8 7.7  Albumin 3.8 - 4.8 g/dL 4.5 4.4 4.1  AST 0 - 40 IU/L 25 25 93(H)  ALT 0 - 32 IU/L 18 15 87(H)  Alk Phosphatase 39 - 117 IU/L 101 110 78  Total Bilirubin 0.0 - 1.2 mg/dL 0.6 0.3 0.5  Bilirubin, Direct 0.00 - 0.40 mg/dL - - -  The 10-year ASCVD risk score [name] DC Brooke Bonito., et al., 2013) is: 23.2%   Values used to calculate the score:     Age: 50 years     Sex: Female     Is Non-Hispanic African American: No     Diabetic: Yes     Tobacco smoker: Yes     Systolic Blood Pressure: 832 mmHg     Is BP treated: Yes     HDL Cholesterol: 51 mg/dL     Total Cholesterol: 186 mg/dL   Patient has failed these meds in past: NA  Patient is currently query controlled  on the following medications:   Atorvastatin 10 mg qd  We discussed:  Diet and exercise. Mrs. Gumbs does not have a structured exercise program. Encouraged her to start walking. She reports she is taking better care or herself and eating better.   Plan  Continue current medications   COPD ?/ Chronic bronchitis / Tobacco   Last spirometry score: 2020 FEV1 64% and fvc 74% pre  Gold Grade: Gold 2 (FEV1 50-79%) Current COPD Classification:  B (high sx, <2 exacerbations/yr)  Eosinophil count:  No results found for: EOSPCT%                                Eos (Absolute):  Lab Results  Component Value Date/Time   EOSABS 0.1 11/12/2018 04:28 PM    Tobacco Status:  Social History   Tobacco Use  Smoking Status Current Every Day Smoker   Packs/day: 1.00   Years: 45.00   Pack years: 45.00   Types: Cigarettes  Smokeless Tobacco Never Used    Patient has failed these meds in past: NA Patient is currently controlled on the following medications:   Stiolto 2 puffs daily  Albuterol prn   Using maintenance inhaler regularly? Yes Frequency of rescue inhaler use:  3-5x times per week  We discussed:  Patient has been out of Stiolto ~ one week. She was given a sample today and PAP renewal application completed in office. Will fax after PCP signs. She reports having ~ 3 months supply of albuterol on hand. Previously received Ventolin through PAP. [employer] is no longer assistance programs for this med. Patient will call pharmacy to determine copay and pharmacy team will continue looking for alternatives. Patient has cut back on smoking and a pack now lasts her 3 days. She also reports a reduction in cocaine since taking Bupropion and reports "it's a miracle drug"  Plan  Continue current medications . Complete and submit PAP for Stiolto.  Osteopenia / Osteoporosis/ Vit D Deficiency   Last DEXA Scan: Never performed    Vit D, 25-Hydroxy  Date Value Ref Range Status  12/11/2018 34.4 30.0 - 100.0 ng/mL Final    Comment:    Vitamin D deficiency has been defined by the Institute of Medicine and an Endocrine Society practice guideline as a level of serum 25-OH vitamin D less than 20 ng/mL (1,2). The Endocrine Society went on to further define vitamin D insufficiency as a level between 21 and 29 ng/mL (2). 1. IOM (Institute of Medicine). 2010. Dietary reference    intakes for calcium and D. [place]: The    [employer]. 2. Holick MF, Binkley [place], Bischoff-Ferrari HA, et al.    Evaluation, treatment, and  prevention of vitamin D    deficiency: an Endocrine Society clinical practice    guideline. JCEM. 2011 Jul;  96(7):1911-30.      Patient is osteoporosis status is unknown.  Patient has failed these meds in past: NA Patient is currently query controlled on the following medications:   None  We discussed:  Recommend [phone] units of vitamin D daily. Recommend 1200 mg of calcium daily from dietary and supplemental sources. Recommend weight-bearing and muscle strengthening exercises for building and maintaining bone density. Patient completed 8 weeks of vitamin d 50,000 iu weekly and needs repeat labs. Patient plans to get a DEXA scan after her eye exam and mammogram. She is getting her finances in order and will have extra money at the end of this month. Advised patient to call front office and ask about scheduling of mobile mammography trailer. Encouraged her to utilize available community resources for food, assistance with heating bills etc.  Plan  Continue current medications and control with diet and exercise.  Recommend increasing dietary calcium, recheck vitamin d level.    Vaccines   Reviewed and discussed patient's vaccination history.    Immunization History  Administered Date(s) Administered   Fluad Quad(high Dose 65+) 12/11/2018, 01/21/2020   Influenza, High Dose Seasonal PF 02/07/2018   Pneumococcal Conjugate-13 02/07/2018   Pneumococcal Polysaccharide-23 08/14/2019   Tdap 06/19/2016    Plan  Scheduled COVID vaccine online for patient tomorrow at 1:45 and 2nd dose in February. Recommend she receive Shingrix at the health department.Called [employer] for Vision benefit info and searched online for in network providers. Patient will schedule eye exam in a month or so when she will have money to purchase new glasses.  Depression / Anxiety   PHQ9 Score:  PHQ9 SCORE ONLY 01/21/2020 09/25/2019 08/14/2019  PHQ-9 Total Score '2 3 4   ' GAD7 Score: GAD 7 : Generalized Anxiety  Score 08/15/2018  Nervous, Anxious, on Edge 2  Control/stop worrying 3  Worry too much - different things 2  Trouble relaxing 2  Restless 1  Easily annoyed or irritable 0  Afraid - awful might happen 0  Total GAD 7 Score 10  Anxiety Difficulty Somewhat difficult       Patient has failed these meds in past: NA Patient is currently controlled on the following medications:   Bupropion XL 150 mg q24 hr  Hydroxyzine 10 mg bid prn   Trazodone 50 mg qhs   We discussed:  Patient is very satisfied with this regimen. She has decreased cocaine, alcohol and smoking since starting this medication. She denies any suicidal ideations. She sleeps well  Plan  Continue current medications       Medication Management   Patient's preferred pharmacy is:  [place], [place] - [address] [place] [address] Phone: [phone] Fax: [phone]  Uses pill box? Yes  Pt endorses 90% compliance  We discussed: Discussed benefits of medication synchronization, packaging and delivery as well as enhanced pharmacist oversight with [employer].  Plan  Continue current medication management strategy    Follow up: 2 month phone visit  Junita Push. Kenton Kingfisher PharmD, [facility] [phone]

## 2020-04-02 NOTE — Patient Instructions (Addendum)
Visit Information:  Kristen Mills,   It was a pleasure meeting you today! Thank you for letting me be part of your clinical team. Keep up the great work!  Please call me if you need more Stiolto samples before your inhalers arrive from the manufacturer.   Birdena Crandall PharmD [phone]  Goals Addressed            This Visit's Progress   . Pharmacy Care Plan       CARE PLAN ENTRY (see longitudinal plan of care for additional care plan information)  Current Barriers:  . Chronic Disease Management support, education, and care coordination needs related to Hypertension, Hyperlipidemia, COPD, Chronic Kidney Disease, and Tobacco use h/o substance abuse   Hypertension BP Readings from Last 3 Encounters:  01/21/20 110/64  11/21/19 (!) 141/89  09/25/19 112/68   . Pharmacist Clinical Goal(s): o Over the next 60 days, patient will work with PharmD and providers to achieve BP goal <130/80 . Current regimen:  o Amlodipine 5 mg qd . Interventions: o Praised patient for decreased drug/alcohol and cigarette use.  o Provided diet and exercise counseling. o Will discuss addition of  medications to preserve kidney function with PCP . Patient self care activities - Over the next 60 days, patient will: o Check BP 2-3 times weekly, document, and provide at future appointments o Ensure daily salt intake < 2300 mg/day o Focus on diet and increasing exercise   COPD/Chronic bronchitis . Pharmacist Clinical Goal(s) o Over the next 60 days, patient will work with PharmD and providers to eliminate financial barriers, reduce symptoms and optimize quality of life  . Current regimen:  o Stiolto 2 puffs daily o Albuterol 1-2 puffs q6h prn . Interventions: o Assisted patient in completing PAP  application for [place] for albuterol options o Assisted patient is scheduling COVID vaccination appointments (1st dose tomorrow) o Encouraged smoking cessation and praised patient for  cutting back. Will continue to offer assistance. . Patient self care activities - Over the next 60 days, patient will: o Contact PCP or PharmD if Stiolto sample needed prior to PAP received. o Continue focusing on self care with healthy meals, adequate sleep and exercise. o Avoid triggers.  Medication management . Pharmacist Clinical Goal(s): o Over the next 60 days, patient will work with PharmD and providers to achieve optimal medication adherence . Current pharmacy: [facility] . Interventions o Comprehensive medication review performed. o Continue current medication management strategy . Patient self care activities - Over the next 60 days, patient will: o Focus on medication adherence by fill dates o Take medications as prescribed o Report any questions or concerns to PharmD and/or provider(s)  Initial goal documentation        The patient verbalized understanding of instructions, educational materials, and care plan provided today and agreed to receive a mailed copy of patient instructions, educational materials, and care plan.   Telephone follow up appointment with pharmacy team member scheduled for: 2 months  Junita Push. Alynna Hargrove PharmD, BCPS Clinical Pharmacist [phone]  Eating Plan for Chronic Obstructive Pulmonary Disease Chronic obstructive pulmonary disease (COPD) causes symptoms such as shortness of breath, coughing, and chest discomfort. These symptoms can make it difficult to eat enough to maintain a healthy weight. Generally, people with COPD should eat a diet that is high in calories, protein, and other nutrients to maintain body weight and to keep the lungs as healthy as possible. Depending on the medicines you take and other health  conditions you may have, your health care provider may give you additional recommendations on what to eat or avoid. Talk with your health care provider about your goals for body weight, and work with a dietitian to  develop an eating plan that is right for you. What are tips for following this plan? Reading food labels   Avoid foods with more than 300 milligrams (mg) of salt (sodium) per serving.  Choose foods that contain at least 4 grams (g) of fiber per serving. Try to eat 20-30 g of fiber each day.  Choose foods that are high in calories and protein, such as nuts, beans, yogurt, and cheese. Shopping  Do not buy foods labeled as diet, low-calorie, or low-fat.  If you are able to eat dairy products: ? Avoid low-fat or skim milk. ? Buy dairy products that have at least 2% fat.  Buy nutritional supplement drinks.  Buy grains and prepared foods labeled as enriched or fortified.  Consider buying low-sodium, pre-made foods to conserve energy for eating. Cooking  Add dry milk or protein powder to smoothies.  Cook with healthy fats, such as olive oil, canola oil, sunflower oil, and grapeseed oil.  Add oil, butter, cream cheese, or nut butters to foods to increase fat and calories.  To make foods easier to chew and swallow: ? Cook vegetables, pasta, and rice until soft. ? Cut or grind meat into very small pieces. ? Dip breads in liquid. Meal planning   Eat when you feel hungry.  Eat 5-6 small meals throughout the day.  Drink 6-8 glasses of water each day.  Do not drink liquids with meals. Drink liquids at the end of the meal to avoid feeling full too quickly.  Eat a variety of fruits and vegetables every day.  Ask for assistance from family or friends with planning and preparing meals as needed.  Avoid foods that cause you to feel bloated, such as carbonated drinks, fried foods, beans, broccoli, cabbage, and apples.  For older adults, ask your local agency on aging whether you are eligible for meal assistance programs, such as Meals on Wheels. Lifestyle   Do not smoke.  Eat slowly. Take small bites and chew food well before swallowing.  Do not overeat. This may make it more  difficult to breathe after eating.  Sit up while eating.  If needed, continue to use supplemental oxygen while eating.  Rest or relax for 30 minutes before and after eating.  Monitor your weight as told by your health care provider.  Exercise as told by your health care provider. What foods can I eat? Fruits All fresh, dried, canned, or frozen fruits that do not cause gas. Vegetables All fresh, canned (no salt added), or frozen vegetables that do not cause gas. Grains Whole grain bread. Enriched whole grain pasta. Fortified whole grain cereals. Fortified rice. Quinoa. Meats and other proteins Lean meat. Poultry. Fish. Dried beans. Unsalted nuts. Tofu. Eggs. Nut butters. Dairy Whole or 2% milk. Cheese. Yogurt. Fats and oils Olive oil. Canola oil. Butter. Margarine. Beverages Water. Vegetable juice (no salt added). Decaffeinated coffee. Decaffeinated or herbal tea. Seasonings and condiments Fresh or dried herbs. Low-salt or salt-free seasonings. Low-sodium soy sauce. The items listed above may not be a complete list of foods and beverages you can eat. Contact a dietitian for more information. What foods are not recommended? Fruits Fruits that cause gas, such as apples or melon. Vegetables Vegetables that cause gas, such as broccoli, Brussels sprouts, cabbage, cauliflower, and  onions. Canned vegetables with added salt. Meats and other proteins Fried meat. Salt-cured meat. Processed meat. Dairy Fat-free or low-fat milk, yogurt, or cheese. Processed cheese. Beverages Carbonated drinks. Caffeinated drinks, such as coffee, tea, and soft drinks. Juice. Alcohol. Vegetable juice with added salt. Seasonings and condiments Salt. Seasoning mixes with salt. Soy sauce. [place]. Other foods Clear soup or broth. Fried foods. Prepared frozen meals. The items listed above may not be a complete list of foods and beverages you should avoid. Contact a dietitian for more  information. Summary  COPD symptoms can make it difficult to eat enough to maintain a healthy weight.  A COPD eating plan can help you maintain your body weight and keep your lungs as healthy as possible.  Eat a diet that is high in calories, protein, and other nutrients. Read labels to make sure that you are getting the right nutrients. Cook foods to make them easy to chew and swallow.  Eat 5-6 small meals throughout the day, and avoid foods that cause gas or make you feel bloated. This information is not intended to replace advice given to you by your health care provider. Make sure you discuss any questions you have with your health care provider. Document Revised: 07/05/2018 Document Reviewed: 05/30/2017 Elsevier Patient Education  2020 [employer].

## 2020-04-13 ENCOUNTER — Telehealth: Payer: Self-pay | Admitting: Pharmacist

## 2020-04-17 ENCOUNTER — Ambulatory Visit: Payer: Self-pay

## 2020-04-20 ENCOUNTER — Other Ambulatory Visit: Payer: Self-pay | Admitting: Nurse Practitioner

## 2020-04-20 MED ORDER — TIOTROPIUM BROMIDE-OLODATEROL 2.5-2.5 MCG/ACT IN AERS
2.0000 | INHALATION_SPRAY | Freq: Every day | RESPIRATORY_TRACT | 5 refills | Status: DC
Start: 2020-04-20 — End: 2020-12-03

## 2020-04-22 NOTE — Progress Notes (Addendum)
    Chronic Care Management Pharmacy Assistant   Name: Kristen Mills  MRN: [id number] DOB: 1950/12/09  Reason for Encounter: Patient Assistance/ Expedite Request.   PCP : [name], NP  Allergies:  No Known Allergies  Medications: Outpatient Encounter Medications as of 04/13/2020  Medication Sig   albuterol (VENTOLIN HFA) 108 (90 Base) MCG/ACT inhaler INHALE 2 PUFFS EVERY FOUR TO SIX HOURS AS NEEDED FOR SHORTNESS OF BREATH.   amLODipine (NORVASC) 5 MG tablet Take 1 tablet (5 mg total) by mouth daily.   atorvastatin (LIPITOR) 10 MG tablet Take 1 tablet (10 mg total) by mouth daily at 6 PM.   buPROPion (WELLBUTRIN XL) 150 MG 24 hr tablet Take 300 MG (2 tablets) once daily by mouth.   Cholecalciferol (VITAMIN D3) 1.25 MG (50000 UT) CAPS TAKE 1 CAPSULE BY MOUTH ONCE A WEEK FOR 8 WEEKS THEN STOP. RETURN TO OFFICE FOR LAB DRAW.   gabapentin (NEURONTIN) 300 MG capsule TAKE 1 CAPSULE BY MOUTH 3 TIMES A DAY   hydrOXYzine (ATARAX/VISTARIL) 10 MG tablet TAKE 1 TABLET BY MOUTH TWICE A DAY AS NEEDED   traZODone (DESYREL) 50 MG tablet TAKE ONE TABLET BY MOUTH AT BEDTIME   [DISCONTINUED] Tiotropium Bromide-Olodaterol 2.5-2.5 MCG/ACT AERS Inhale 2 puffs into the lungs daily.   No facility-administered encounter medications on file as of 04/13/2020.    Current Diagnosis: Patient Active Problem List   Diagnosis Date Noted   Financial difficulties 08/14/2019   CKD (chronic kidney disease) stage 3, GFR 30-59 ml/min (HCC) 08/14/2019   BMI 27.0-27.9,adult 08/14/2019   Vitamin D deficiency 12/11/2018   Simple chronic bronchitis ([place]) 08/15/2018   Cocaine abuse ([place]) 08/15/2018   Depression, major, recurrent, moderate ([place]) 08/15/2018   Elevated hemoglobin A1c 02/23/2017   Chronic hepatitis C without hepatic coma ([place]) 11/03/2015   Hyperlipemia 04/09/2015   ETOH abuse 12/30/2014   Insomnia 12/30/2014   Spinal stenosis 11/20/2014   Hypertension 08/20/2014   Nicotine dependence, cigarettes,  w unsp disorders 09/24/2013    04-13-2020: Patient assistance application was completed and faxed to [place] for [employer]. Attempted to contact a representative with [employer] but hold times were greater than 5 minutes. Requested a call back from the automated system. Attempting to request the patient's application to be expedited due to being out of [employer].  04-15-20: Second attempt to speak to a representative. Again excessive waiting times. Requested another same-day callback.   04-17-20: Spoke to a [place] representative and explained the patient's situation. Per the representative, [employer] does not perform "expedited requests" or have "emergency vouchers". She continued that have they received the patient's application and it was showing as "pending".   PharmD Birdena Crandall made aware of attempt and outcome.   Cloretta Ned, LPN Clinical Pharmacist Assistant  [phone]    Follow-Up:  Pharmacist Review  I have reviewed the care management and care coordination activities outlined in this encounter and I am certifying that I agree with the content of this note.   Junita Push. Kenton Kingfisher PharmD, [place] [facility] [phone]

## 2020-04-30 ENCOUNTER — Telehealth: Payer: Self-pay | Admitting: Pharmacist

## 2020-05-03 NOTE — Progress Notes (Addendum)
    Chronic Care Management Pharmacy Assistant   Name: Kristen Mills  MRN: [id number] DOB: 01-17-1951  Reason for Encounter: Follow Up with [employer] Patient Assistance Program   PCP : Marnee Guarneri T, NP  Allergies:  No Known Allergies  Medications: Outpatient Encounter Medications as of 04/30/2020  Medication Sig   albuterol (VENTOLIN HFA) 108 (90 Base) MCG/ACT inhaler INHALE 2 PUFFS EVERY FOUR TO SIX HOURS AS NEEDED FOR SHORTNESS OF BREATH.   amLODipine (NORVASC) 5 MG tablet Take 1 tablet (5 mg total) by mouth daily.   atorvastatin (LIPITOR) 10 MG tablet Take 1 tablet (10 mg total) by mouth daily at 6 PM.   buPROPion (WELLBUTRIN XL) 150 MG 24 hr tablet Take 300 MG (2 tablets) once daily by mouth.   Cholecalciferol (VITAMIN D3) 1.25 MG (50000 UT) CAPS TAKE 1 CAPSULE BY MOUTH ONCE A WEEK FOR 8 WEEKS THEN STOP. RETURN TO OFFICE FOR LAB DRAW.   gabapentin (NEURONTIN) 300 MG capsule TAKE 1 CAPSULE BY MOUTH 3 TIMES A DAY   hydrOXYzine (ATARAX/VISTARIL) 10 MG tablet TAKE 1 TABLET BY MOUTH TWICE A DAY AS NEEDED   Tiotropium Bromide-Olodaterol 2.5-2.5 MCG/ACT AERS Inhale 2 puffs into the lungs daily.   traZODone (DESYREL) 50 MG tablet TAKE ONE TABLET BY MOUTH AT BEDTIME   No facility-administered encounter medications on file as of 04/30/2020.    Current Diagnosis: Patient Active Problem List   Diagnosis Date Noted   Financial difficulties 08/14/2019   CKD (chronic kidney disease) stage 3, GFR 30-59 ml/min (HCC) 08/14/2019   BMI 27.0-27.9,adult 08/14/2019   Vitamin D deficiency 12/11/2018   Simple chronic bronchitis ([place]) 08/15/2018   Cocaine abuse ([place]) 08/15/2018   Depression, major, recurrent, moderate ([place]) 08/15/2018   Elevated hemoglobin A1c 02/23/2017   Chronic hepatitis C without hepatic coma ([place]) 11/03/2015   Hyperlipemia 04/09/2015   ETOH abuse 12/30/2014   Insomnia 12/30/2014   Spinal stenosis 11/20/2014   Hypertension 08/20/2014   Nicotine dependence, cigarettes, w  unsp disorders 09/24/2013    04-30-20: Attempted to speak with a BI program specialist, but a recording stated; "  We're sorry, but no customer service representatives are available at this time and please call back at a later time".   05-01-20: Second attempt to speak with a BI program specialist. Attempted to speak with a BI program specialist, but a recording stated; "  We're sorry, but no customer service representatives are available at this time and please call back at a later time".   05-04-2020: Third attempt to follow up with patient assistance program [employer]. A recording stated " Due to high call volumes and excessive hold times we do not have any call center representatives available at this time. Approval or denial letters are being mailed out." Made PharmD Birdena Crandall aware.  Cloretta Ned, LPN Clinical Pharmacist Assistant  [phone]   Follow-Up:  Pharmacist Review  I have reviewed the care management and care coordination activities outlined in this encounter and I am certifying that I agree with the content of this note.  Junita Push. Kenton Kingfisher PharmD, [facility] [phone]

## 2020-05-11 ENCOUNTER — Other Ambulatory Visit: Payer: Self-pay | Admitting: Nurse Practitioner

## 2020-06-08 ENCOUNTER — Telehealth: Payer: Self-pay

## 2020-06-08 NOTE — Progress Notes (Deleted)
Chronic Care Management Pharmacy Note  06/08/2020 Name:  Kristen Mills MRN:  [id number] DOB:  1950/12/29  Subjective: Kristen Mills is an 70 y.o. year old female who is a primary patient of Cannady, Barbaraann Faster, NP.  The CCM team was consulted for assistance with disease management and care coordination needs.    {CCMTELEPHONEFACETOFACE:21091510} for {CCMINITIALFOLLOWUPCHOICE:21091511} in response to provider referral for pharmacy case management and/or care coordination services.   Consent to Services:  {CCMCONSENTOPTIONS:25074}  Patient Care Team: [name], NP as PCP - General (Nurse Practitioner)  Recent office visits: ***  Recent consult visits: [facility] visits: {Hospital DC Yes/No:25215}  Objective:  Lab Results  Component Value Date   CREATININE 1.36 (H) 01/21/2020   BUN 20 01/21/2020   GFRNONAA 40 (L) 01/21/2020   GFRAA 46 (L) 01/21/2020   NA 138 01/21/2020   K 4.9 01/21/2020   CALCIUM 9.7 01/21/2020   CO2 22 01/21/2020    Lab Results  Component Value Date/Time   HGBA1C 5.8 (H) 01/21/2020 04:51 PM   HGBA1C 5.9 (H) 11/12/2018 04:28 PM   HGBA1C 5.8 02/17/2015 12:00 AM    Last diabetic Eye exam: No results found for: HMDIABEYEEXA  Last diabetic Foot exam: No results found for: HMDIABFOOTEX   Lab Results  Component Value Date   CHOL 186 01/21/2020   HDL 51 01/21/2020   LDLCALC 94 01/21/2020   TRIG 244 (H) 01/21/2020   CHOLHDL 2.6 10/20/2015    Hepatic Function Latest Ref Rng & Units 12/11/2018 11/12/2018 06/19/2016  Total Protein 6.0 - 8.5 g/dL 6.9 6.8 7.7  Albumin 3.8 - 4.8 g/dL 4.5 4.4 4.1  AST 0 - 40 IU/L 25 25 93(H)  ALT 0 - 32 IU/L 18 15 87(H)  Alk Phosphatase 39 - 117 IU/L 101 110 78  Total Bilirubin 0.0 - 1.2 mg/dL 0.6 0.3 0.5  Bilirubin, Direct 0.00 - 0.40 mg/dL - - -    Lab Results  Component Value Date/Time   TSH 2.690 01/21/2020 04:51 PM   TSH 2.660 11/12/2018 04:28 PM    CBC Latest Ref Rng & Units 11/12/2018 06/19/2016  05/03/2016  WBC 3.4 - 10.8 x10E3/uL 12.3(H) 10.0 9.2  Hemoglobin 11.1 - 15.9 g/dL 15.1 13.5 13.7  Hematocrit 34.0 - 46.6 % 44.5 39.8 41.0  Platelets 150 - 450 x10E3/uL 312 293 317    Lab Results  Component Value Date/Time   VD25OH 34.4 12/11/2018 04:04 PM   VD25OH 7.2 (L) 11/12/2018 04:28 PM    Clinical ASCVD: {YES/NO:21197} The 10-year ASCVD risk score [name] DC Jr., et al., 2013) is: 23.2%   Values used to calculate the score:     Age: 39 years     Sex: Female     Is Non-Hispanic African American: No     Diabetic: Yes     Tobacco smoker: Yes     Systolic Blood Pressure: 147 mmHg     Is BP treated: Yes     HDL Cholesterol: 51 mg/dL     Total Cholesterol: 186 mg/dL    Depression screen [facility] 2/9 01/21/2020 09/25/2019 08/14/2019  Decreased Interest 0 1 2  Down, Depressed, Hopeless 0 1 2  PHQ - 2 Score 0 2 4  Altered sleeping 1 0 -  Tired, decreased energy 1 1 -  Change in appetite 0 0 -  Feeling bad or failure about yourself  0 0 -  Trouble concentrating 0 0 -  Moving slowly or fidgety/restless 0 0 -  Suicidal thoughts 0 0 -  PHQ-9 Score 2 3 -  Difficult doing work/chores Not difficult at all Not difficult at all -     ***Other: (CHADS2VASc if Afib, MMRC or CAT for COPD, ACT, DEXA)  Social History   Tobacco Use  Smoking Status Current Every Day Smoker  . Packs/day: 1.00  . Years: 45.00  . Pack years: 45.00  . Types: Cigarettes  Smokeless Tobacco Never Used   BP Readings from Last 3 Encounters:  01/21/20 110/64  11/21/19 (!) 141/89  09/25/19 112/68   Pulse Readings from Last 3 Encounters:  01/21/20 98  11/21/19 87  09/25/19 98   Wt Readings from Last 3 Encounters:  01/21/20 147 lb 12.8 oz (67 kg)  11/21/19 142 lb (64.4 kg)  09/25/19 140 lb 9.6 oz (63.8 kg)    Assessment/Interventions: Review of patient past medical history, allergies, medications, health status, including review of consultants reports, laboratory and other test data, was performed as part  of comprehensive evaluation and provision of chronic care management services.   SDOH:  (Social Determinants of Health) assessments and interventions performed: {yes/no:20286}   CCM Care Plan  No Known Allergies  Medications Reviewed Today    Reviewed by Vladimir Faster, [facility] (Pharmacist) on 04/01/20 at 1641  Med List Status: <None>  Medication Order Taking? Sig Documenting Provider Last Dose Status Informant  albuterol (VENTOLIN HFA) 108 (90 Base) MCG/ACT inhaler [id number] Yes INHALE 2 PUFFS EVERY FOUR TO SIX HOURS AS NEEDED FOR SHORTNESS OF BREATH. Marnee Guarneri T, NP Taking Active   amLODipine (NORVASC) 5 MG tablet [id number] Yes Take 1 tablet (5 mg total) by mouth daily. Marnee Guarneri T, NP Taking Active   atorvastatin (LIPITOR) 10 MG tablet [id number] Yes Take 1 tablet (10 mg total) by mouth daily at 6 PM. Cannady, Jolene T, NP Taking Active   buPROPion (WELLBUTRIN XL) 150 MG 24 hr tablet [id number] Yes Take 300 MG (2 tablets) once daily by mouth. Marnee Guarneri T, NP Taking Active   Cholecalciferol (VITAMIN D3) 1.25 MG (50000 UT) CAPS [id number] Yes TAKE 1 CAPSULE BY MOUTH ONCE A WEEK FOR 8 WEEKS THEN STOP. RETURN TO OFFICE FOR LAB DRAW. Marnee Guarneri T, NP Taking Active   gabapentin (NEURONTIN) 300 MG capsule [id number] Yes TAKE 1 CAPSULE BY MOUTH 3 TIMES A DAY Cannady, Jolene T, NP Taking Active   hydrOXYzine (ATARAX/VISTARIL) 10 MG tablet [id number] Yes TAKE 1 TABLET BY MOUTH TWICE A DAY AS NEEDED Cannady, Jolene T, NP Taking Active   Tiotropium Bromide-Olodaterol 2.5-2.5 MCG/ACT AERS [id number] Yes Inhale 2 puffs into the lungs daily. Marnee Guarneri T, NP Taking Active   traZODone (DESYREL) 50 MG tablet [id number] Yes TAKE ONE TABLET BY MOUTH AT BEDTIME [name], NP Taking Active           Patient Active Problem List   Diagnosis Date Noted  . Financial difficulties 08/14/2019  . CKD (chronic kidney disease) stage 3, GFR 30-59 ml/min (HCC) 08/14/2019  . BMI  27.0-27.9,adult 08/14/2019  . Vitamin D deficiency 12/11/2018  . Simple chronic bronchitis ([place]) 08/15/2018  . Cocaine abuse ([place]) 08/15/2018  . Depression, major, recurrent, moderate ([place]) 08/15/2018  . Elevated hemoglobin A1c 02/23/2017  . Chronic hepatitis C without hepatic coma ([place]) 11/03/2015  . Hyperlipemia 04/09/2015  . ETOH abuse 12/30/2014  . Insomnia 12/30/2014  . Spinal stenosis 11/20/2014  . Hypertension 08/20/2014  . Nicotine dependence, cigarettes, w unsp disorders 09/24/2013    Immunization History  Administered Date(s) Administered  .  Fluad Quad(high Dose 65+) 12/11/2018, 01/21/2020  . Influenza, High Dose Seasonal PF 02/07/2018  . Pneumococcal Conjugate-13 02/07/2018  . Pneumococcal Polysaccharide-23 08/14/2019  . Tdap 06/19/2016    Conditions to be addressed/monitored:  {USCCMDZASSESSMENTOPTIONS:23563}  There are no care plans that you recently modified to display for this patient.    Medication Assistance: {MEDASSISTANCEINFO:25044}  Patient's preferred pharmacy is:  [facility] [place], [place] - [address] [address] Phone: [phone] Fax: [phone]  Uses pill box? {Yes or If no, why not?:20788} Pt endorses ***% compliance  We discussed: {Pharmacy options:24294} Patient decided to: {US Pharmacy Plan:23885}  Care Plan and Follow Up Patient Decision:  {FOLLOWUP:24991}  Plan: {CM FOLLOW UP PLAN:25073}  ***

## 2020-06-16 NOTE — Telephone Encounter (Signed)
To close telephone encounter 

## 2020-06-27 ENCOUNTER — Other Ambulatory Visit: Payer: Self-pay | Admitting: Nurse Practitioner

## 2020-06-27 NOTE — Telephone Encounter (Signed)
Requested Prescriptions  Pending Prescriptions Disp Refills  . hydrOXYzine (ATARAX/VISTARIL) 10 MG tablet [Pharmacy Med Name: HYDROXYZINE HCL 10 MG TAB] 180 tablet 1    Sig: TAKE 1 TABLET BY MOUTH TWICE A DAY AS NEEDED     Ear, Nose, and Throat:  Antihistamines Passed - 06/27/2020 12:08 PM      Passed - Valid encounter within last 12 months    Recent Outpatient Visits          5 months ago Simple chronic bronchitis ([place])   [place], [place] T, NP   7 months ago Sebaceous cyst   [facility] Kathrine Haddock, NP   9 months ago COPD exacerbation ([place])   [place], [place] T, NP   10 months ago Depression, major, recurrent, moderate ([place])   [place], Henrine [place] T, NP   1 year ago Simple chronic bronchitis ([place])   [place], Barbaraann Faster, NP      Future Appointments            In 3 weeks Cannady, Barbaraann Faster, NP [employer]

## 2020-06-27 NOTE — Telephone Encounter (Signed)
Requested Prescriptions  Pending Prescriptions Disp Refills  . traZODone (DESYREL) 50 MG tablet [Pharmacy Med Name: TRAZODONE HCL 50 MG TAB] 90 tablet 0    Sig: TAKE ONE TABLET BY MOUTH AT BEDTIME     Psychiatry: Antidepressants - Serotonin Modulator Passed - 06/27/2020 12:12 PM      Passed - Completed PHQ-2 or PHQ-9 in the last 360 days      Passed - Valid encounter within last 6 months    Recent Outpatient Visits          5 months ago Simple chronic bronchitis ([place])   [place], [place] T, NP   7 months ago Sebaceous cyst   [facility] Kathrine Haddock, NP   9 months ago COPD exacerbation ([place])   [place], [place] T, NP   10 months ago Depression, major, recurrent, moderate ([place])   [place], Jolene T, NP   1 year ago Simple chronic bronchitis ([place])   [place], Barbaraann Faster, NP      Future Appointments            In 3 weeks Cannady, Barbaraann Faster, NP [employer] [facility]

## 2020-07-01 ENCOUNTER — Telehealth: Payer: Self-pay

## 2020-07-01 ENCOUNTER — Other Ambulatory Visit: Payer: Self-pay | Admitting: Nurse Practitioner

## 2020-07-01 MED ORDER — ALBUTEROL SULFATE HFA 108 (90 BASE) MCG/ACT IN AERS
INHALATION_SPRAY | RESPIRATORY_TRACT | 5 refills | Status: DC
Start: 2020-07-01 — End: 2021-07-12

## 2020-07-01 MED ORDER — ALBUTEROL SULFATE HFA 108 (90 BASE) MCG/ACT IN AERS: INHALATION_SPRAY | RESPIRATORY_TRACT | 5 refills | Status: DC

## 2020-07-01 NOTE — Progress Notes (Signed)
    Chronic Care Management Pharmacy Assistant   Name: DUSTEE BOTTENFIELD  MRN: [id number] DOB: [birth year]  Reason for Encounter: Disease State- COPD Disease State Call   Recent office visits:  No visits noted  Recent consult visits:  No visits noted   Hospital visits:  None in previous 6 months  Medications: Outpatient Encounter Medications as of 07/01/2020  Medication Sig  . albuterol (VENTOLIN HFA) 108 (90 Base) MCG/ACT inhaler INHALE 2 PUFFS EVERY FOUR TO SIX HOURS AS NEEDED FOR SHORTNESS OF BREATH.  Marland Kitchen amLODipine (NORVASC) 5 MG tablet Take 1 tablet (5 mg total) by mouth daily.  Marland Kitchen atorvastatin (LIPITOR) 10 MG tablet Take 1 tablet (10 mg total) by mouth daily at 6 PM.  . buPROPion (WELLBUTRIN XL) 150 MG 24 hr tablet Take 300 MG (2 tablets) once daily by mouth.  . Cholecalciferol (VITAMIN D3) 1.25 MG (50000 UT) CAPS TAKE 1 CAPSULE BY MOUTH ONCE A WEEK FOR 8 WEEKS THEN STOP. RETURN TO OFFICE FOR LAB DRAW.  . gabapentin (NEURONTIN) 300 MG capsule TAKE 1 CAPSULE BY MOUTH 3 TIMES A DAY  . hydrOXYzine (ATARAX/VISTARIL) 10 MG tablet TAKE 1 TABLET BY MOUTH TWICE A DAY AS NEEDED  . Tiotropium Bromide-Olodaterol 2.5-2.5 MCG/ACT AERS Inhale 2 puffs into the lungs daily.  . traZODone (DESYREL) 50 MG tablet TAKE ONE TABLET BY MOUTH AT BEDTIME   No facility-administered encounter medications on file as of 07/01/2020.   Current COPD regimen:  Tiotropium Bromide-Olodaterol 2.5-2.5 MCG/ACT AERS(Stiolto) - 2 puffs daily   Albuterol (VENTOLIN HFA) 108  MCG/ACT inhaler- one to two puffs every six hours as need   Any recent hospitalizations or ED visits since last visit with CPP? No   Patient reports COPD symptoms, including Increased shortness of breath , Rescue medicine is not helping, Shortness of breath at rest, Symptoms worse with exercise and Wheezing   What recent interventions/DTPs have been made by any provider to improve breathing since last visit: None ID  Have you had  exacerbation/flare-up since last visit? Yes   What do you do when you are short of breath? Patient stated she uses  Rescue medication, Anxiety medication and Rest  Respiratory Devices/Equipment Do you have a nebulizer? No   Do you use a Peak Flow Meter? No   Do you use a maintenance inhaler? Yes   How often do you forget to use your daily inhaler?  Patient stated she always remembers to use her daily inhaler  Do you use a rescue inhaler? Yes  Patient stated that she has not had her Albuterol inhaler for a month due to not having active Rx. Contacted CPP to request new inhaler order.     How often do you use your rescue inhaler?  1-2x per week   Do you use a spacer with your inhaler? No  Adherence Review: Does the patient have >5 day gap between last estimated fill date for maintenance inhaler medications? No   Star Rating Drugs: Atorvastatin 10mg - 90DS last filled 03/13/20   Wilford Sports CPA,CMA

## 2020-07-15 ENCOUNTER — Telehealth: Payer: Self-pay

## 2020-07-15 NOTE — Chronic Care Management (AMB) (Signed)
    Chronic Care Management Pharmacy Assistant   Name: Kristen Mills  MRN: [id number] DOB: 07-Dec-1950  Reason for Encounter: Phone call Stiolto refill   Medications: Outpatient Encounter Medications as of 07/15/2020  Medication Sig  . albuterol (VENTOLIN HFA) 108 (90 Base) MCG/ACT inhaler INHALE 2 PUFFS EVERY FOUR TO SIX HOURS AS NEEDED FOR SHORTNESS OF BREATH.  Marland Kitchen amLODipine (NORVASC) 5 MG tablet Take 1 tablet (5 mg total) by mouth daily.  Marland Kitchen atorvastatin (LIPITOR) 10 MG tablet Take 1 tablet (10 mg total) by mouth daily at 6 PM.  . buPROPion (WELLBUTRIN XL) 150 MG 24 hr tablet Take 300 MG (2 tablets) once daily by mouth.  . Cholecalciferol (VITAMIN D3) 1.25 MG (50000 UT) CAPS TAKE 1 CAPSULE BY MOUTH ONCE A WEEK FOR 8 WEEKS THEN STOP. RETURN TO OFFICE FOR LAB DRAW.  . gabapentin (NEURONTIN) 300 MG capsule TAKE 1 CAPSULE BY MOUTH 3 TIMES A DAY  . hydrOXYzine (ATARAX/VISTARIL) 10 MG tablet TAKE 1 TABLET BY MOUTH TWICE A DAY AS NEEDED  . Tiotropium Bromide-Olodaterol 2.5-2.5 MCG/ACT AERS Inhale 2 puffs into the lungs daily.  . traZODone (DESYREL) 50 MG tablet TAKE ONE TABLET BY MOUTH AT BEDTIME   No facility-administered encounter medications on file as of 07/15/2020.   I called and spoke with patient and she informed me that she will be needing a refill for her Stiolto and to be scheduled for a Covid-19 booster shot. I called [employer], patient assistance for stiolto, and spoke with Elle. Scheduled a refill delivery on behalf of [name] for 05/09. I informed [name] and she is aware of the delivery date and has confirmed a present sufficient quantity remaining. I connected with CPP about booster shot and confirmed this will be scheduled on her behalf.   Wilford Sports CPA, CMA

## 2020-07-17 ENCOUNTER — Other Ambulatory Visit: Payer: Self-pay | Admitting: Nurse Practitioner

## 2020-07-21 ENCOUNTER — Other Ambulatory Visit: Payer: Self-pay

## 2020-07-21 ENCOUNTER — Ambulatory Visit (INDEPENDENT_AMBULATORY_CARE_PROVIDER_SITE_OTHER): Payer: PPO | Admitting: Nurse Practitioner

## 2020-07-21 ENCOUNTER — Encounter: Payer: Self-pay | Admitting: Nurse Practitioner

## 2020-07-21 ENCOUNTER — Telehealth: Payer: Self-pay

## 2020-07-21 VITALS — BP 101/66 | HR 88 | Temp 98.3°F | Ht 61.0 in | Wt 147.4 lb

## 2020-07-21 DIAGNOSIS — Z0000 Encounter for general adult medical examination without abnormal findings: Secondary | ICD-10-CM | POA: Diagnosis not present

## 2020-07-21 DIAGNOSIS — F5104 Psychophysiologic insomnia: Secondary | ICD-10-CM

## 2020-07-21 DIAGNOSIS — B182 Chronic viral hepatitis C: Secondary | ICD-10-CM | POA: Diagnosis not present

## 2020-07-21 DIAGNOSIS — F17219 Nicotine dependence, cigarettes, with unspecified nicotine-induced disorders: Secondary | ICD-10-CM

## 2020-07-21 DIAGNOSIS — Z6827 Body mass index (BMI) 27.0-27.9, adult: Secondary | ICD-10-CM | POA: Diagnosis not present

## 2020-07-21 DIAGNOSIS — F1410 Cocaine abuse, uncomplicated: Secondary | ICD-10-CM

## 2020-07-21 DIAGNOSIS — R8281 Pyuria: Secondary | ICD-10-CM | POA: Diagnosis not present

## 2020-07-21 DIAGNOSIS — E559 Vitamin D deficiency, unspecified: Secondary | ICD-10-CM | POA: Diagnosis not present

## 2020-07-21 DIAGNOSIS — J410 Simple chronic bronchitis: Secondary | ICD-10-CM

## 2020-07-21 DIAGNOSIS — E782 Mixed hyperlipidemia: Secondary | ICD-10-CM

## 2020-07-21 DIAGNOSIS — N1831 Chronic kidney disease, stage 3a: Secondary | ICD-10-CM | POA: Diagnosis not present

## 2020-07-21 DIAGNOSIS — R7309 Other abnormal glucose: Secondary | ICD-10-CM

## 2020-07-21 DIAGNOSIS — F331 Major depressive disorder, recurrent, moderate: Secondary | ICD-10-CM | POA: Diagnosis not present

## 2020-07-21 DIAGNOSIS — Z599 Problem related to housing and economic circumstances, unspecified: Secondary | ICD-10-CM

## 2020-07-21 DIAGNOSIS — I10 Essential (primary) hypertension: Secondary | ICD-10-CM

## 2020-07-21 LAB — MICROSCOPIC EXAMINATION: RBC, Urine: NONE SEEN /hpf (ref 0–2)

## 2020-07-21 LAB — URINALYSIS, ROUTINE W REFLEX MICROSCOPIC
Bilirubin, UA: NEGATIVE
Glucose, UA: NEGATIVE
Ketones, UA: NEGATIVE
Nitrite, UA: NEGATIVE
RBC, UA: NEGATIVE
Specific Gravity, UA: 1.015 (ref 1.005–1.030)
Urobilinogen, Ur: 0.2 mg/dL (ref 0.2–1.0)
pH, UA: 5.5 (ref 5.0–7.5)

## 2020-07-21 MED ORDER — VITAMIN D3 1.25 MG (50000 UT) PO CAPS: ORAL_CAPSULE | ORAL | 2 refills | Status: DC

## 2020-07-21 MED ORDER — GABAPENTIN 300 MG PO CAPS: 1.0000 | ORAL_CAPSULE | Freq: Three times a day (TID) | ORAL | 2 refills | Status: DC

## 2020-07-21 MED ORDER — AMLODIPINE BESYLATE 5 MG PO TABS: 5.0000 mg | ORAL_TABLET | Freq: Every day | ORAL | 4 refills | Status: DC

## 2020-07-21 MED ORDER — TRAZODONE HCL 50 MG PO TABS: 50.0000 mg | ORAL_TABLET | Freq: Every day | ORAL | 4 refills | Status: DC

## 2020-07-21 MED ORDER — ATORVASTATIN CALCIUM 10 MG PO TABS: 10.0000 mg | ORAL_TABLET | Freq: Every day | ORAL | 4 refills | Status: DC

## 2020-07-21 NOTE — Assessment & Plan Note (Signed)
Chronic, stable with BP at goal today.  Continue current medication regimen and adjust as needed.  Recommend checking BP at home 3 mornings a week and documenting for provider + DASH diet.  CMP and TSH today + urinalysis.  Recommend complete cessation of cocaine use.  Return in 6 months for follow-up.

## 2020-07-21 NOTE — Progress Notes (Signed)
BP 101/66   Pulse 88   Temp 98.3 F (36.8 C) (Oral)   Ht 5\' 1"  (1.549 m)   Wt 147 lb 6.4 oz (66.9 kg)   SpO2 96%   BMI 27.85 kg/m    Subjective:    Patient ID: Kristen Mills, female    DOB: [birth year], 70 y.o.   MRN: [id number]  HPI: Kristen Mills is a 70 y.o. female presenting on 07/21/2020 for comprehensive medical examination. Current medical complaints include:none  She currently lives with: lives by self Menopausal Symptoms: no   COPD Has been out of Stiolto for one month due to income.  Is using Albuterol, but more winded without Stiolto.  Is working with CCM team on assistance. COPD status: stable Satisfied with current treatment?: yes Oxygen use: no Dyspnea frequency:  Cough frequency:  Rescue inhaler frequency:   Limitation of activity: no Productive cough:  Last Spirometry:  Pneumovax: Up to Date Influenza: Up to Date   HYPERTENSION / HYPERLIPIDEMIA Continues on Amlodipine dailyand Atorvastatin.  A1c in October 2021 was 5.8%, denies any symptoms. Satisfied with current treatment?yes Duration of hypertension:chronic BP monitoring frequency:not checking BP range:  BP medication side effects:no Duration of hyperlipidemia:chronic Cholesterol supplements: none Aspirin:no Recent stressors:no Recurrent headaches:no Visual changes:no Palpitations:no Dyspnea:no Chest pain:no Lower extremity edema:no Dizzy/lightheaded:no The 10-year ASCVD risk score [name] DC Jr., et al., 2013) is: 19.9%   Values used to calculate the score:     Age: 23 years     Sex: Female     Is Non-Hispanic African American: No     Diabetic: Yes     Tobacco smoker: Yes     Systolic Blood Pressure: 99991111 mmHg     Is BP treated: Yes     HDL Cholesterol: 51 mg/dL     Total Cholesterol: 186 mg/dL   CHRONIC KIDNEY DISEASE Recent CRT 1.36 and GFR 40, no current ACE or ARB. CKD status: stable Medications renally dose: yes Previous renal evaluation: no Pneumovax:   Up to Date Influenza Vaccine:  Up to Date   DEPRESSION She continues on Wellbutrin which has greatly improved mood and helped with reduction in cocaine use and smoking -- recently missed a couple weeks of doses and noticed cravings for cocaine and smoking more.  Using Trazodone for sleep.  Continues to use cocaine, last used yesterday, currently using three or more times a week.  States she uses it due to making her feel good, but had been using less due to Wellbutrin, although recently increased stressors.  Refuses rehab, therapy, or psychiatry referral. Has history of meal disparity, which is a current issue at this time and struggling with bills. Mood status: exacerbated Satisfied with current treatment?: yes Symptom severity: moderate  Duration of current treatment : chronic Side effects: no Medication compliance: good compliance Psychotherapy/counseling: none Depressed mood: yes Anxious mood: yes Anhedonia: no Significant weight loss or gain: no Insomnia: none Fatigue: no Feelings of worthlessness or guilt: no Impaired concentration/indecisiveness: yes Suicidal ideations: no Hopelessness: no Crying spells: no Depression screen [facility] 2/9 07/21/2020 01/21/2020 09/25/2019 08/14/2019 08/14/2019  Decreased Interest 3 0 1 2 1   Down, Depressed, Hopeless 0 0 1 2 1   PHQ - 2 Score 3 0 2 4 2   Altered sleeping 0 1 0 - 2  Tired, decreased energy 3 1 1  - 2  Change in appetite 0 0 0 - 1  Feeling bad or failure about yourself  0 0 0 - 2  Trouble concentrating 0  0 0 - 1  Moving slowly or fidgety/restless 0 0 0 - 0  Suicidal thoughts 0 0 0 - 0  PHQ-9 Score 6 2 3  - 10  Difficult doing work/chores - Not difficult at all Not difficult at all - Somewhat difficult    The patient does not have a history of falls. I did not complete a risk assessment for falls. A plan of care for falls was not documented.   Past Medical History:  Past Medical History:  Diagnosis Date  . Asthma   . Cancer [facility])     Uterine Cancer  . COPD (chronic obstructive pulmonary disease) ([place])   . Depression   . Diabetes mellitus without complication ([place])   . Hyperlipidemia   . Hypertension   . Insomnia   . Polysubstance abuse ([place])   . Spinal stenosis     Surgical History:  Past Surgical History:  Procedure Laterality Date  . ABDOMINAL HYSTERECTOMY      Medications:  Current Outpatient Medications on File Prior to Visit  Medication Sig  . albuterol (VENTOLIN HFA) 108 (90 Base) MCG/ACT inhaler INHALE 2 PUFFS EVERY FOUR TO SIX HOURS AS NEEDED FOR SHORTNESS OF BREATH.  . buPROPion (WELLBUTRIN XL) 150 MG 24 hr tablet Take 300 MG (2 tablets) once daily by mouth.  . hydrOXYzine (ATARAX/VISTARIL) 10 MG tablet TAKE 1 TABLET BY MOUTH TWICE A DAY AS NEEDED  . Tiotropium Bromide-Olodaterol 2.5-2.5 MCG/ACT AERS Inhale 2 puffs into the lungs daily.   No current facility-administered medications on file prior to visit.    Allergies:  No Known Allergies  Social History:  Social History   Socioeconomic History  . Marital status: Single    Spouse name: Not on file  . Number of children: Not on file  . Years of education: Not on file  . Highest education level: Not on file  Occupational History  . Not on file  Tobacco Use  . Smoking status: Current Every Day Smoker    Packs/day: 1.00    Years: 45.00    Pack years: 45.00    Types: Cigarettes  . Smokeless tobacco: Never Used  Vaping Use  . Vaping Use: Former  Substance and Sexual Activity  . Alcohol use: Yes    Alcohol/week: 3.0 standard drinks    Types: 3 Cans of beer per week    Comment: Few beers daily.  . Drug use: Yes    Types: "Crack" cocaine, Marijuana, Cocaine    Comment: currently cocaine  . Sexual activity: Not Currently  Other Topics Concern  . Not on file  Social History Narrative  . Not on file   Social Determinants of Health   Financial Resource Strain: Medium Risk  . Difficulty of Paying Living Expenses: Somewhat hard   Food Insecurity: Food Insecurity Present  . Worried About Charity fundraiser in the Last Year: Often true  . Ran Out of Food in the Last Year: Often true  Transportation Needs: Not on file  Physical Activity: Not on file  Stress: Not on file  Social Connections: Not on file  Intimate Partner Violence: Not on file   Social History   Tobacco Use  Smoking Status Current Every Day Smoker  . Packs/day: 1.00  . Years: 45.00  . Pack years: 45.00  . Types: Cigarettes  Smokeless Tobacco Never Used   Social History   Substance and Sexual Activity  Alcohol Use Yes  . Alcohol/week: 3.0 standard drinks  . Types: 3 Cans of  beer per week   Comment: Few beers daily.    Family History:  Family History  Problem Relation Age of Onset  . Cancer Mother        Lung  . Cancer Father        Lung  . Heart disease Sister   . Heart attack Sister   . Cancer Brother        Lung  . Bipolar disorder Son     Past medical history, surgical history, medications, allergies, family history and social history reviewed with patient today and changes made to appropriate areas of the chart.   ROS All other ROS negative except what is listed above and in the HPI.      Objective:    BP 101/66   Pulse 88   Temp 98.3 F (36.8 C) (Oral)   Ht 5\' 1"  (1.549 m)   Wt 147 lb 6.4 oz (66.9 kg)   SpO2 96%   BMI 27.85 kg/m   Wt Readings from Last 3 Encounters:  07/21/20 147 lb 6.4 oz (66.9 kg)  01/21/20 147 lb 12.8 oz (67 kg)  11/21/19 142 lb (64.4 kg)    Physical Exam Vitals and nursing note reviewed.  Constitutional:      General: She is awake. She is not in acute distress.    Appearance: She is well-developed. She is not ill-appearing.  HENT:     Head: Normocephalic and atraumatic.     Right Ear: Hearing, tympanic membrane, ear canal and external ear normal. No drainage.     Left Ear: Hearing, tympanic membrane, ear canal and external ear normal. No drainage.     Nose: Nose normal.     Right  Sinus: No maxillary sinus tenderness or frontal sinus tenderness.     Left Sinus: No maxillary sinus tenderness or frontal sinus tenderness.     Mouth/Throat:     Mouth: Mucous membranes are moist.     Pharynx: Oropharynx is clear. Uvula midline. No pharyngeal swelling, oropharyngeal exudate or posterior oropharyngeal erythema.  Eyes:     General: Lids are normal.        Right eye: No discharge.        Left eye: No discharge.     Extraocular Movements: Extraocular movements intact.     Conjunctiva/sclera: Conjunctivae normal.     Pupils: Pupils are equal, round, and reactive to light.     Visual Fields: Right eye visual fields normal and left eye visual fields normal.  Neck:     Thyroid: No thyromegaly.     Vascular: No carotid bruit.     Trachea: Trachea normal.  Cardiovascular:     Rate and Rhythm: Normal rate and regular rhythm.     Heart sounds: Normal heart sounds. No murmur heard. No gallop.   Pulmonary:     Effort: Pulmonary effort is normal. No accessory muscle usage or respiratory distress.     Breath sounds: Wheezing present.     Comments: Scattered expiratory wheezes throughout per baseline. Chest:     Comments: Deferred per patient request. Abdominal:     General: Bowel sounds are normal.     Palpations: Abdomen is soft. There is no hepatomegaly or splenomegaly.     Tenderness: There is no abdominal tenderness.  Musculoskeletal:        General: Normal range of motion.     Cervical back: Normal range of motion and neck supple.     Right lower leg: No edema.  Left lower leg: No edema.  Lymphadenopathy:     Head:     Right side of head: No submental, submandibular, tonsillar, preauricular or posterior auricular adenopathy.     Left side of head: No submental, submandibular, tonsillar, preauricular or posterior auricular adenopathy.     Cervical: No cervical adenopathy.  Skin:    General: Skin is warm and dry.     Capillary Refill: Capillary refill takes less  than 2 seconds.     Findings: No rash.     Comments: Scattered pale purple bruises bilateral upper and lower extremities.  Neurological:     Mental Status: She is alert and oriented to person, place, and time.     Cranial Nerves: Cranial nerves are intact.     Gait: Gait is intact.     Deep Tendon Reflexes: Reflexes are normal and symmetric.     Reflex Scores:      Brachioradialis reflexes are 2+ on the right side and 2+ on the left side.      Patellar reflexes are 2+ on the right side and 2+ on the left side. Psychiatric:        Attention and Perception: Attention normal.        Mood and Affect: Mood normal.        Speech: Speech normal.        Behavior: Behavior normal. Behavior is cooperative.        Thought Content: Thought content normal.        Judgment: Judgment normal.    Results for orders placed or performed in visit on 07/21/20  Microscopic Examination   Urine  Result Value Ref Range   WBC, UA 0-5 0 - 5 /hpf   RBC None seen 0 - 2 /hpf   Epithelial Cells (non renal) 0-10 0 - 10 /hpf   Mucus, UA Present (A) Not Estab.   Bacteria, UA Many (A) None seen/Few  Urinalysis, Routine w reflex microscopic  Result Value Ref Range   Specific Gravity, UA 1.015 1.005 - 1.030   pH, UA 5.5 5.0 - 7.5   Color, UA Yellow Yellow   Appearance Ur Cloudy (A) Clear   Leukocytes,UA Trace (A) Negative   Protein,UA 1+ (A) Negative/Trace   Glucose, UA Negative Negative   Ketones, UA Negative Negative   RBC, UA Negative Negative   Bilirubin, UA Negative Negative   Urobilinogen, Ur 0.2 0.2 - 1.0 mg/dL   Nitrite, UA Negative Negative   Microscopic Examination See below:       Assessment & Plan:   Problem List Items Addressed This Visit      Cardiovascular and Mediastinum   Hypertension    Chronic, stable with BP at goal today.  Continue current medication regimen and adjust as needed.  Recommend checking BP at home 3 mornings a week and documenting for provider + DASH diet.  CMP and  TSH today + urinalysis.  Recommend complete cessation of cocaine use.  Return in 6 months for follow-up.       Relevant Medications   amLODipine (NORVASC) 5 MG tablet   atorvastatin (LIPITOR) 10 MG tablet   Other Relevant Orders   Comprehensive metabolic panel   TSH     Respiratory   Simple chronic bronchitis (HCC)    Chronic, ongoing.  Continue Stiolto and Albuterol.  Recommend complete smoking cessation, continue Wellbutrin which has helped with reduction.  Refuses lung CT CA screening at this time, but provided information on this.  She does  not wish to pursue pulmonary referral at this time, but may benefit from in future.  Reports "what happens happens and I do not want to know if anything is there".  Return in 6 months for follow-up.  X 2 Stiolto samples provided in office today.  Spirometry next visit.      Relevant Orders   CBC with Differential/Platelet     Digestive   Chronic hepatitis C without hepatic coma (HCC)    Repeat Hep C antibody at next visit and consider GI referral for further work-up, at this time refuses both lab for this or referral.  Recommended no ETOH or cocaine use.        Nervous and Auditory   Nicotine dependence, cigarettes, w unsp disorders    I have recommended complete cessation of tobacco use. She continues on Wellbutrin, which has offered benefit.  Refuses CT lung screening.         Genitourinary   CKD (chronic kidney disease) stage 3, GFR 30-59 ml/min (HCC)    Chronic, ongoing.  Repeat CMP today.  No current ACE or ARB, due to severe COPD would lean towards ARB if addition of kidney protection agreed upon with patient.  If worsening function will refer to nephrology.  Urine ALB today.      Relevant Orders   Urinalysis, Routine w reflex microscopic (Completed)     Other   Hyperlipemia    Chronic, ongoing.  Continue current medication regimen and adjust as needed.  Lipid panel today.      Relevant Medications   amLODipine (NORVASC) 5  MG tablet   atorvastatin (LIPITOR) 10 MG tablet   Other Relevant Orders   Lipid Panel w/o Chol/HDL Ratio   Insomnia    Chronic, improved on Trazodone.  Continue current medication regimen, refills sent.        Elevated hemoglobin A1c    Check A1C today and initiate medication as needed if appropriate.      Relevant Orders   Hemoglobin A1c   Cocaine abuse ([place]) - Primary    Had been cutting back on use with Wellbutrin on board, but recently had period of increased use due to no Wellbutrin for a couple weeks, has restarted now.  Praised for reduction of use and recommend complete cessation.  Suspect this is reason BP is trending downwards, discussed with her, as is using less cocaine and alcohol.  Have discussed at length high risk for cardiac death with continued cocaine use.      Depression, major, recurrent, moderate ([place])    Ongoing, but improved with Wellbutrin on board.  Denies SI/HI.  She has reduced cocaine and alcohol use + smoking.  Continue current medication regimen and adjust as needed.  Refills sent in.  To alert provider or go immediately to ER if SI/HI presents.  Return in 6 months.      Relevant Medications   traZODone (DESYREL) 50 MG tablet   Vitamin D deficiency    Continue supplement and recheck Vit D level today.  May consider switch from weekly to daily dosing if improved.      Relevant Orders   VITAMIN D 25 Hydroxy (Vit-D Deficiency, Fractures)   Financial difficulties    Referral to care management placed to assist where needed.      Relevant Orders   AMB Referral to [facility]   BMI 27.0-27.9,adult    Recommended eating smaller high protein, low fat meals more frequently and exercising 30 mins a day 5  times a week with a goal of 10-15lb weight loss in the next 3 months. Patient voiced their understanding and motivation to adhere to these recommendations.        Other Visit Diagnoses    Pyuria       Urine for culture   Relevant Orders    Urine Culture   Annual physical exam       Annual labs today.  Reviewed health maintenance, she refuses most of this reporting "if something happens it happens and I would not want to know".       Follow up plan: Return in about 6 months (around 01/20/2021) for COPD, HTN/HLD, MOOD, COCAINE USE, -- need spirometry.   LABORATORY TESTING:  - Pap smear: not applicable  IMMUNIZATIONS:   - Tdap: Tetanus vaccination status reviewed: last tetanus booster within 10 years. - Influenza: Up to date - Pneumovax: Up to date - Prevnar: Up to date - HPV: Not applicable - Zostavax vaccine: Refused  SCREENING: -Mammogram: Refused  - Colonoscopy: Refused  - Bone Density: Refused  -Hearing Test: Not applicable  -Spirometry: Refused   PATIENT COUNSELING:   Advised to take 1 mg of folate supplement per day if capable of pregnancy.   Sexuality: Discussed sexually transmitted diseases, partner selection, use of condoms, avoidance of unintended pregnancy  and contraceptive alternatives.   Advised to avoid cigarette smoking.  I discussed with the patient that most people either abstain from alcohol or drink within safe limits (<=14/week and <=4 drinks/occasion for males, <=7/weeks and <= 3 drinks/occasion for females) and that the risk for alcohol disorders and other health effects rises proportionally with the number of drinks per week and how often a drinker exceeds daily limits.  Discussed cessation/primary prevention of drug use and availability of treatment for abuse.   Diet: Encouraged to adjust caloric intake to maintain  or achieve ideal body weight, to reduce intake of dietary saturated fat and total fat, to limit sodium intake by avoiding high sodium foods and not adding table salt, and to maintain adequate dietary potassium and calcium preferably from fresh fruits, vegetables, and low-fat dairy products.    Stressed the importance of regular exercise  Injury prevention: Discussed  safety belts, safety helmets, smoke detector, smoking near bedding or upholstery.   Dental health: Discussed importance of regular tooth brushing, flossing, and dental visits.    NEXT PREVENTATIVE PHYSICAL DUE IN 1 YEAR. Return in about 6 months (around 01/20/2021) for COPD, HTN/HLD, MOOD, COCAINE USE, -- need spirometry.

## 2020-07-21 NOTE — Assessment & Plan Note (Signed)
Chronic, ongoing.  Continue Stiolto and Albuterol.  Recommend complete smoking cessation, continue Wellbutrin which has helped with reduction.  Refuses lung CT CA screening at this time, but provided information on this.  She does not wish to pursue pulmonary referral at this time, but may benefit from in future.  Reports "what happens happens and I do not want to know if anything is there".  Return in 6 months for follow-up.  X 2 Stiolto samples provided in office today.  Spirometry next visit.

## 2020-07-21 NOTE — Assessment & Plan Note (Signed)
Continue supplement and recheck Vit D level today.  May consider switch from weekly to daily dosing if improved. ?

## 2020-07-21 NOTE — Assessment & Plan Note (Signed)
Recommended eating smaller high protein, low fat meals more frequently and exercising 30 mins a day 5 times a week with a goal of 10-15lb weight loss in the next 3 months. Patient voiced their understanding and motivation to adhere to these recommendations.  

## 2020-07-21 NOTE — Assessment & Plan Note (Signed)
Referral to care management placed to assist where needed.

## 2020-07-21 NOTE — Assessment & Plan Note (Signed)
Ongoing, but improved with Wellbutrin on board.  Denies SI/HI.  She has reduced cocaine and alcohol use + smoking.  Continue current medication regimen and adjust as needed.  Refills sent in.  To alert provider or go immediately to ER if SI/HI presents.  Return in 6 months. ?

## 2020-07-21 NOTE — Patient Instructions (Signed)
Chronic Obstructive Pulmonary Disease  Chronic obstructive pulmonary disease (COPD) is a long-term (chronic) lung problem. When you have COPD, it is hard for air to get in and out of your lungs. Usually the condition gets worse over time, and your lungs will never return to normal. There are things you can do to keep yourself as healthy as possible. What are the causes?  Smoking. This is the most common cause.  Certain genes passed from parent to child (inherited). What increases the risk?  Being exposed to secondhand smoke from cigarettes, pipes, or cigars.  Being exposed to chemicals and other irritants, such as fumes and dust in the work environment.  Having chronic lung conditions or infections. What are the signs or symptoms?  Shortness of breath, especially during physical activity.  A long-term cough with a large amount of thick mucus. Sometimes, the cough may not have any mucus (dry cough).  Wheezing.  Breathing quickly.  Skin that looks gray or blue, especially in the fingers, toes, or lips.  Feeling tired (fatigue).  Weight loss.  Chest tightness.  Having infections often.  Episodes when breathing symptoms become much worse (exacerbations). At the later stages of this disease, you may have swelling in the ankles, feet, or legs. How is this treated?  Taking medicines.  Quitting smoking, if you smoke.  Rehabilitation. This includes steps to make your body work better. It may involve a team of specialists.  Doing exercises.  Making changes to your diet.  Using oxygen.  Lung surgery.  Lung transplant.  Comfort measures (palliative care). Follow these instructions at home: Medicines  Take over-the-counter and prescription medicines only as told by your doctor.  Talk to your doctor before taking any cough or allergy medicines. You may need to avoid medicines that cause your lungs to be dry. Lifestyle  If you smoke, stop smoking. Smoking makes the  problem worse.  Do not smoke or use any products that contain nicotine or tobacco. If you need help quitting, ask your doctor.  Avoid being around things that make your breathing worse. This may include smoke, chemicals, and fumes.  Stay active, but remember to rest as well.  Learn and use tips on how to manage stress and control your breathing.  Make sure you get enough sleep. Most adults need at least 7 hours of sleep every night.  Eat healthy foods. Eat smaller meals more often. Rest before meals. Controlled breathing Learn and use tips on how to control your breathing as told by your doctor. Try:  Breathing in (inhaling) through your nose for 1 second. Then, pucker your lips and breath out (exhale) through your lips for 2 seconds.  Putting one hand on your belly (abdomen). Breathe in slowly through your nose for 1 second. Your hand on your belly should move out. Pucker your lips and breathe out slowly through your lips. Your hand on your belly should move in as you breathe out.   Controlled coughing Learn and use controlled coughing to clear mucus from your lungs. Follow these steps: 1. Lean your head a little forward. 2. Breathe in deeply. 3. Try to hold your breath for 3 seconds. 4. Keep your mouth slightly open while coughing 2 times. 5. Spit any mucus out into a tissue. 6. Rest and do the steps again 1 or 2 times as needed. General instructions  Make sure you get all the shots (vaccines) that your doctor recommends. Ask your doctor about a flu shot and a pneumonia shot.    Use oxygen therapy and pulmonary rehabilitation if told by your doctor. If you need home oxygen therapy, ask your doctor if you should buy a tool to measure your oxygen level (oximeter).  Make a COPD action plan with your doctor. This helps you to know what to do if you feel worse than usual.  Manage any other conditions you have as told by your doctor.  Avoid going outside when it is very hot, cold, or  humid.  Avoid people who have a sickness you can catch (contagious).  Keep all follow-up visits. Contact a doctor if:  You cough up more mucus than usual.  There is a change in the color or thickness of the mucus.  It is harder to breathe than usual.  Your breathing is faster than usual.  You have trouble sleeping.  You need to use your medicines more often than usual.  You have trouble doing your normal activities such as getting dressed or walking around the house. Get help right away if:  You have shortness of breath while resting.  You have shortness of breath that stops you from: ? Being able to talk. ? Doing normal activities.  Your chest hurts for longer than 5 minutes.  Your skin color is more blue than usual.  Your pulse oximeter shows that you have low oxygen for longer than 5 minutes.  You have a fever.  You feel too tired to breathe normally. These symptoms may represent a serious problem that is an emergency. Do not wait to see if the symptoms will go away. Get medical help right away. Call your local emergency services (911 in the [place] Do not drive yourself to the hospital. Summary  Chronic obstructive pulmonary disease (COPD) is a long-term lung problem.  The way your lungs work will never return to normal. Usually the condition gets worse over time. There are things you can do to keep yourself as healthy as possible.  Take over-the-counter and prescription medicines only as told by your doctor.  If you smoke, stop. Smoking makes the problem worse. This information is not intended to replace advice given to you by your health care provider. Make sure you discuss any questions you have with your health care provider. Document Revised: 01/21/2020 Document Reviewed: 01/21/2020 [employer]vier Patient Education  2021 [employer]   

## 2020-07-21 NOTE — Assessment & Plan Note (Signed)
Had been cutting back on use with Wellbutrin on board, but recently had period of increased use due to no Wellbutrin for a couple weeks, has restarted now.  Praised for reduction of use and recommend complete cessation.  Suspect this is reason BP is trending downwards, discussed with her, as is using less cocaine and alcohol.  Have discussed at length high risk for cardiac death with continued cocaine use.

## 2020-07-21 NOTE — Assessment & Plan Note (Signed)
Repeat Hep C antibody at next visit and consider GI referral for further work-up, at this time refuses both lab for this or referral.  Recommended no ETOH or cocaine use. ?

## 2020-07-21 NOTE — Assessment & Plan Note (Signed)
Chronic, ongoing.  Continue current medication regimen and adjust as needed. Lipid panel today. 

## 2020-07-21 NOTE — Assessment & Plan Note (Signed)
I have recommended complete cessation of tobacco use. She continues on Wellbutrin, which has offered benefit.  Refuses CT lung screening.

## 2020-07-21 NOTE — Assessment & Plan Note (Signed)
Check A1C today and initiate medication as needed if appropriate. 

## 2020-07-21 NOTE — Assessment & Plan Note (Signed)
Chronic, improved on Trazodone.  Continue current medication regimen, refills sent.   ?

## 2020-07-21 NOTE — Assessment & Plan Note (Signed)
Chronic, ongoing.  Repeat CMP today.  No current ACE or ARB, due to severe COPD would lean towards ARB if addition of kidney protection agreed upon with patient.  If worsening function will refer to nephrology.  Urine ALB today.

## 2020-07-22 ENCOUNTER — Other Ambulatory Visit: Payer: Self-pay | Admitting: Nurse Practitioner

## 2020-07-22 DIAGNOSIS — N1831 Chronic kidney disease, stage 3a: Secondary | ICD-10-CM

## 2020-07-22 DIAGNOSIS — R7989 Other specified abnormal findings of blood chemistry: Secondary | ICD-10-CM

## 2020-07-22 DIAGNOSIS — E559 Vitamin D deficiency, unspecified: Secondary | ICD-10-CM

## 2020-07-22 LAB — COMPREHENSIVE METABOLIC PANEL
ALT: 17 IU/L (ref 0–32)
AST: 27 IU/L (ref 0–40)
Albumin/Globulin Ratio: 2 (ref 1.2–2.2)
Albumin: 4.7 g/dL (ref 3.8–4.8)
Alkaline Phosphatase: 104 IU/L (ref 44–121)
BUN/Creatinine Ratio: 11 — ABNORMAL LOW (ref 12–28)
BUN: 16 mg/dL (ref 8–27)
Bilirubin Total: 0.5 mg/dL (ref 0.0–1.2)
CO2: 20 mmol/L (ref 20–29)
Calcium: 10 mg/dL (ref 8.7–10.3)
Chloride: 99 mmol/L (ref 96–106)
Creatinine, Ser: 1.5 mg/dL — ABNORMAL HIGH (ref 0.57–1.00)
Globulin, Total: 2.4 g/dL (ref 1.5–4.5)
Glucose: 94 mg/dL (ref 65–99)
Potassium: 5.4 mmol/L — ABNORMAL HIGH (ref 3.5–5.2)
Sodium: 136 mmol/L (ref 134–144)
Total Protein: 7.1 g/dL (ref 6.0–8.5)
eGFR: 38 mL/min/{1.73_m2} — ABNORMAL LOW (ref >59)

## 2020-07-22 LAB — CBC WITH DIFFERENTIAL/PLATELET
Basophils Absolute: 0.1 10*3/uL (ref 0.0–0.2)
Basos: 1 %
EOS (ABSOLUTE): 0.2 10*3/uL (ref 0.0–0.4)
Eos: 2 %
Hematocrit: 44.4 % (ref 34.0–46.6)
Hemoglobin: 14.5 g/dL (ref 11.1–15.9)
Immature Grans (Abs): 0 10*3/uL (ref 0.0–0.1)
Immature Granulocytes: 0 %
Lymphocytes Absolute: 2.9 10*3/uL (ref 0.7–3.1)
Lymphs: 32 %
MCH: 29.4 pg (ref 26.6–33.0)
MCHC: 32.7 g/dL (ref 31.5–35.7)
MCV: 90 fL (ref 79–97)
Monocytes Absolute: 0.9 10*3/uL (ref 0.1–0.9)
Monocytes: 10 %
Neutrophils Absolute: 4.9 10*3/uL (ref 1.4–7.0)
Neutrophils: 55 %
Platelets: 321 10*3/uL (ref 150–450)
RBC: 4.94 x10E6/uL (ref 3.77–5.28)
RDW: 13.6 % (ref 11.7–15.4)
WBC: 9.1 10*3/uL (ref 3.4–10.8)

## 2020-07-22 LAB — HEMOGLOBIN A1C
Est. average glucose Bld gHb Est-mCnc: 123 mg/dL
Hgb A1c MFr Bld: 5.9 % — ABNORMAL HIGH (ref 4.8–5.6)

## 2020-07-22 LAB — LIPID PANEL W/O CHOL/HDL RATIO
Cholesterol, Total: 150 mg/dL (ref 100–199)
HDL: 61 mg/dL (ref >39)
LDL Chol Calc (NIH): 67 mg/dL (ref 0–99)
Triglycerides: 126 mg/dL (ref 0–149)
VLDL Cholesterol Cal: 22 mg/dL (ref 5–40)

## 2020-07-22 LAB — TSH: TSH: 5.68 u[IU]/mL — ABNORMAL HIGH (ref 0.450–4.500)

## 2020-07-22 LAB — VITAMIN D 25 HYDROXY (VIT D DEFICIENCY, FRACTURES): Vit D, 25-Hydroxy: 27.7 ng/mL — ABNORMAL LOW (ref 30.0–100.0)

## 2020-07-22 NOTE — Progress Notes (Signed)
Please reach out to [facility] and let her know labs have returned.  CBC shows no anemia.  Kidney function is showing some decline this check, I would like her to ensure she is drinking plenty of water at home and cut back on any alcohol use and cocaine use as we have discussed.  Her potassium level is also a little high, reduce foods high in potassium such as bananas and potatoes.  Liver function is normal.  Cholesterol levels remain at goal with her medication.  Thyroid level is slightly elevated this check.  A1c remains in prediabetic range, continue to monitor diet.  Vitamin D remains on lower side, please continue weekly Vitamin D supplement ordered.  I would like her to schedule a lab visit only for 6 weeks in office please to recheck her kidney function, potassium, and thyroid.  I have ordered these labs.  Please schedule.  Any questions? Keep being awesome!!  Thank you for allowing me to participate in your care. Kindest regards, Tae Vonada

## 2020-07-24 LAB — URINE CULTURE

## 2020-07-27 ENCOUNTER — Telehealth: Payer: Self-pay | Admitting: *Deleted

## 2020-07-27 NOTE — Telephone Encounter (Signed)
   Telephone encounter was:  Successful.  07/27/2020 Name: Kristen Mills MRN: [id number] DOB: 03-Aug-1950  Kristen Mills is a 70 y.o. year old female who is a primary care patient of Cannady, Barbaraann Faster, NP . The community resource team was consulted for assistance with Transportation Needs , Food Insecurity and Financial Difficulties related to utilities  Care guide performed the following interventions: Patient provided with information about care guide support team and interviewed to confirm resource needs Discussed resources to assist with Patient will receive meals on wheels for 60 days and be waitlisted , sending food stamp, [employer] applications as well as available food banks will apply alamnce charitable fund for [place] relief .  Follow Up Plan:  Patient will call when she can get copy of electric bill to office to be emailed to care guide    [place] , [place], Care Management  [phone] [address] , [place] [address] Email : [email or web address]

## 2020-08-04 ENCOUNTER — Telehealth: Payer: Self-pay | Admitting: *Deleted

## 2020-08-04 NOTE — Telephone Encounter (Signed)
   Telephone encounter was:  Successful.  08/04/2020 Name: Kristen Mills MRN: [id number] DOB: 1950/10/08  Kristen Mills is a 70 y.o. year old female who is a primary care patient of Cannady, Barbaraann Faster, NP . The community resource team was consulted for assistance with Transportation Needs , Food Insecurity and Financial Difficulties related to utilities  Care guide performed the following interventions: Patient provided with information about care guide support team and interviewed to confirm resource needs Discussed resources to assist with utilities .  Follow Up Plan:  Care guide will follow up with patient by phone over the next few  [place] , [place], Care Management  [phone] [address] , [place] [address] Email : [email or web address]

## 2020-08-05 ENCOUNTER — Ambulatory Visit (INDEPENDENT_AMBULATORY_CARE_PROVIDER_SITE_OTHER): Payer: PPO | Admitting: Pharmacist

## 2020-08-05 DIAGNOSIS — E559 Vitamin D deficiency, unspecified: Secondary | ICD-10-CM

## 2020-08-05 DIAGNOSIS — I10 Essential (primary) hypertension: Secondary | ICD-10-CM | POA: Diagnosis not present

## 2020-08-05 DIAGNOSIS — J410 Simple chronic bronchitis: Secondary | ICD-10-CM | POA: Diagnosis not present

## 2020-08-05 NOTE — Progress Notes (Signed)
Chronic Care Management Pharmacy Note  08/06/2020 Name:  Kristen Mills MRN:  [id number] DOB:  [birth year]  Subjective: Kristen Mills is an 70 y.o. year old female who is a primary patient of Cannady, Barbaraann Faster, NP.  The CCM team was consulted for assistance with disease management and care coordination needs.    Engaged with patient by telephone for follow up visit in response to provider referral for pharmacy case management and/or care coordination services.   Consent to Services:  The patient was given information about Chronic Care Management services, agreed to services, and gave verbal consent prior to initiation of services.  Please see initial visit note for detailed documentation.   Patient Care Team: [name], NP as PCP - General (Nurse Practitioner)  Recent office visits: 07/21/20-Cannady(PCP)- blood work, vit d 50000iu qweek, repeat labs in 6 weeks  Recent consult visits: [facility] visits: None in previous 6 months  Objective:  Lab Results  Component Value Date   CREATININE 1.50 (H) 07/21/2020   BUN 16 07/21/2020   GFRNONAA 40 (L) 01/21/2020   GFRAA 46 (L) 01/21/2020   NA 136 07/21/2020   K 5.4 (H) 07/21/2020   CALCIUM 10.0 07/21/2020   CO2 20 07/21/2020   GLUCOSE 94 07/21/2020    Lab Results  Component Value Date/Time   HGBA1C 5.9 (H) 07/21/2020 03:55 PM   HGBA1C 5.8 (H) 01/21/2020 04:51 PM   HGBA1C 5.8 02/17/2015 12:00 AM    Last diabetic Eye exam: No results found for: HMDIABEYEEXA  Last diabetic Foot exam: No results found for: HMDIABFOOTEX   Lab Results  Component Value Date   CHOL 150 07/21/2020   HDL 61 07/21/2020   LDLCALC 67 07/21/2020   TRIG 126 07/21/2020   CHOLHDL 2.6 10/20/2015    Hepatic Function Latest Ref Rng & Units 07/21/2020 12/11/2018 11/12/2018  Total Protein 6.0 - 8.5 g/dL 7.1 6.9 6.8  Albumin 3.8 - 4.8 g/dL 4.7 4.5 4.4  AST 0 - 40 IU/L '27 25 25  ' ALT 0 - 32 IU/L '17 18 15  ' Alk Phosphatase 44 - 121 IU/L 104 101  110  Total Bilirubin 0.0 - 1.2 mg/dL 0.5 0.6 0.3  Bilirubin, Direct 0.00 - 0.40 mg/dL - - -    Lab Results  Component Value Date/Time   TSH 5.680 (H) 07/21/2020 03:55 PM   TSH 2.690 01/21/2020 04:51 PM    CBC Latest Ref Rng & Units 07/21/2020 11/12/2018 06/19/2016  WBC 3.4 - 10.8 x10E3/uL 9.1 12.3(H) 10.0  Hemoglobin 11.1 - 15.9 g/dL 14.5 15.1 13.5  Hematocrit 34.0 - 46.6 % 44.4 44.5 39.8  Platelets 150 - 450 x10E3/uL 321 312 293    Lab Results  Component Value Date/Time   VD25OH 27.7 (L) 07/21/2020 03:55 PM   VD25OH 34.4 12/11/2018 04:04 PM    Clinical ASCVD: No  The 10-year ASCVD risk score [name] DC Jr., et al., 2013) is: 17.6%   Values used to calculate the score:     Age: 60 years     Sex: Female     Is Non-Hispanic African American: No     Diabetic: Yes     Tobacco smoker: Yes     Systolic Blood Pressure: 782 mmHg     Is BP treated: Yes     HDL Cholesterol: 61 mg/dL     Total Cholesterol: 150 mg/dL    Depression screen [facility] 2/9 07/21/2020 01/21/2020 09/25/2019  Decreased Interest 3 0 1  Down, Depressed, Hopeless  0 0 1  PHQ - 2 Score 3 0 2  Altered sleeping 0 1 0  Tired, decreased energy '3 1 1  ' Change in appetite 0 0 0  Feeling bad or failure about yourself  0 0 0  Trouble concentrating 0 0 0  Moving slowly or fidgety/restless 0 0 0  Suicidal thoughts 0 0 0  PHQ-9 Score '6 2 3  ' Difficult doing work/chores - Not difficult at all Not difficult at all       Social History   Tobacco Use  Smoking Status Current Every Day Smoker  . Packs/day: 1.00  . Years: 45.00  . Pack years: 45.00  . Types: Cigarettes  Smokeless Tobacco Never Used   BP Readings from Last 3 Encounters:  07/21/20 101/66  01/21/20 110/64  11/21/19 (!) 141/89   Pulse Readings from Last 3 Encounters:  07/21/20 88  01/21/20 98  11/21/19 87   Wt Readings from Last 3 Encounters:  07/21/20 147 lb 6.4 oz (66.9 kg)  01/21/20 147 lb 12.8 oz (67 kg)  11/21/19 142 lb (64.4 kg)   BMI Readings  from Last 3 Encounters:  07/21/20 27.85 kg/m  01/21/20 28.87 kg/m  11/21/19 27.73 kg/m    Assessment/Interventions: Review of patient past medical history, allergies, medications, health status, including review of consultants reports, laboratory and other test data, was performed as part of comprehensive evaluation and provision of chronic care management services.   SDOH:  (Social Determinants of Health) assessments and interventions performed: No  SDOH Screenings   Alcohol Screen: Not on file  Depression (PHQ2-9): Medium Risk  . PHQ-2 Score: 6  Financial Resource Strain: Medium Risk  . Difficulty of Paying Living Expenses: Somewhat hard  Food Insecurity: Food Insecurity Present  . Worried About Charity fundraiser in the Last Year: Often true  . Ran Out of Food in the Last Year: Often true  Housing: Not on file  Physical Activity: Not on file  Social Connections: Not on file  Stress: Not on file  Tobacco Use: High Risk  . Smoking Tobacco Use: Current Every Day Smoker  . Smokeless Tobacco Use: Never Used  Transportation Needs: Not on file      Immunization History  Administered Date(s) Administered  . Fluad Quad(high Dose 65+) 12/11/2018, 01/21/2020  . Influenza, High Dose Seasonal PF 02/07/2018  . Moderna Sars-Covid-2 Vaccination 04/30/2020  . Pneumococcal Conjugate-13 02/07/2018  . Pneumococcal Polysaccharide-23 08/14/2019  . Tdap 06/19/2016    Conditions to be addressed/monitored:  Hypertension, Hyperlipidemia, COPD, Chronic Kidney Disease, Depression, Tobacco use and elevated A1c, cocaine abuse, alcohol abuse, chronic hep C, vitamin D deficiency  Care Plan : [place]  Updates made by Vladimir Faster, RPH since 08/06/2020 12:00 AM    Problem: HTN, HLD, CKD, IFG, Hepatiits C, ETOh abuuse, cocaine abuse, tobacco abuse   Priority: High    Goal: disease Managemetn   Start Date: 08/05/2020  This Visit's Progress: Not on track  Priority: High   Note:   urrent Barriers:  . Unable to independently afford treatment regimen . Unable to independently monitor therapeutic efficacy . Unable to achieve control of copd, chronic bronchitis,  . Unable to maintain control of substance abuse . Suboptimal therapeutic regimen for CKD, prediabetes . Does not adhere to prescribed medication regimen  Pharmacist Clinical Goal(s):  Marland Kitchen Patient will verbalize ability to afford treatment regimen . achieve adherence to monitoring guidelines and medication adherence to achieve therapeutic efficacy . maintain control of htn,  HLD as evidenced by lab values  . adhere to prescribed medication regimen as evidenced by fill dates through collaboration with PharmD and provider.   Interventions: . 1:1 collaboration with [name], NP regarding development and update of comprehensive plan of care as evidenced by provider attestation and co-signature . Inter-disciplinary care team collaboration (see longitudinal plan of care) . Comprehensive medication review performed; medication list updated in electronic medical record   Hypertension/CKD BP Readings from Last 3 Encounters:  07/21/20 101/66  01/21/20 110/64  11/21/19 (!) 141/89   . Pharmacist Clinical Goal(s): o Over the next 60 days, patient will work with PharmD and providers to achieve BP goal <130/80 . Current regimen:  o Amlodipine 5 mg qd . Interventions: o Provided diet and exercise counseling. o Recommend consider Farxiga for kidney preservation and IFG. . Patient self care activities - Over the next 60 days, patient will: o Check BP 2-3 times weekly, document, and provide at future appointments o Ensure daily salt intake < 2300 mg/day o Focus on diet and increasing exercise   COPD/Chronic bronchitis . Pharmacist Clinical Goal(s) o Over the next 60 days, patient will work with PharmD and providers to eliminate financial barriers, reduce symptoms and optimize quality of  life  . Current regimen:  o Stiolto 2 puffs daily o Albuterol 1-2 puffs q6h prn . Interventions: o Patient assistance approved for Stiolto through 03/27/21 o Encouraged smoking cessation and praised patient for cutting back. Will continue to offer assistance. o Noted persistent cough during conversation. Encouraged patient to schedule appointment with PCP. She declines at this time and will call if she does not improve. . Patient self care activities - Over the next 60 days, patient will: o Call [facility] [phone] when she opens last box of Stiolto to request refills o Continue focusing on self care with healthy meals, adequate sleep and exercise. o Avoid triggers. Cut back on cigarette, alcohol and cocaine use.  Depression/Anxiety/ alcohol/copcain abuse (Goal: cessation of substance abuse and reduced symptoms) -Not ideally controlled -Current treatment: . Wellbutrin XL 150 mg qd -Medications previously tried/failed: NA -PHQ9-6 -Connected with community care guides for mental health support -Educated on Benefits of medication for symptom control Benefits of cognitive-behavioral therapy with or without medication -Recommended patient fill and restart bupropion as soon as possible Assessed patient finances. Odette is very concerned that she will miss the deadline for utility assistance. She has been working with [employer] . She has the bill but not access to a computer and reports her car is broken down. Unable to email photos on her phone and attempted texted image did not go through. Advised patient I will come by her apartment after work and pick up invoice and email to [place] on her behalf. She states she will call [facility] and have her medications delivered today and resume taking.  Last level 27.7 07/21/20 Vitamin D deficiency (Goal: prevent fracture) -Uncontrolled -Current treatment  . Cholecalciferol 50,000 iu weekly (patient has not started) -Medications previously tried:  NA -Assessed patient finances. Reports she cannot afford this week. If assistance comes through for her utility bill she will pick up . Otherwise she will pick up when she gets her next check.    Patient Goals/Self-Care Activities . Patient will:  - take medications as prescribed collaborate with provider on medication access solutions target a minimum of 150 minutes of moderate intensity exercise weekly Reduce alcohol and cocaine use with goal of complete cessation.  Follow Up Plan: Telephone follow  up appointment with care management team member scheduled for: 4 weeks/          Medication Assistance: Stioltoobtained through [facility] medication assistance program.  Enrollment ends 03/27/21  Patient's preferred pharmacy is:  [place], [place] - [address] [place] [address] Phone: [phone] Fax: [phone]  Uses pill box? Yes Pt endorses 80% compliance  We discussed: Benefits of medication synchronization, packaging and delivery as well as enhanced pharmacist oversight with [employer]. Patient decided to: Continue current medication management strategy  Care Plan and Follow Up Patient Decision:  Patient agrees to Care Plan and Follow-up.  Plan: Telephone follow up appointment with care management team member scheduled for:  4-6 weeks  Junita Push. Kenton Kingfisher PharmD, [facility] [phone]  C

## 2020-08-06 ENCOUNTER — Telehealth: Payer: Self-pay | Admitting: Pharmacist

## 2020-08-06 NOTE — Patient Instructions (Addendum)
Visit Information  It was a pleasure speaking with you today. Thank you for letting me be part of your clinical team. Please call with any questions or concerns.   Goals Addressed            This Visit's Progress   . Pharmacy Care Plan       CARE PLAN ENTRY (see longitudinal plan of care for additional care plan information)  Current Barriers:  . Chronic Disease Management support, education, and care coordination needs related to Hypertension, Hyperlipidemia, COPD, Chronic Kidney Disease, and Tobacco use h/o substance abuse   Hypertension BP Readings from Last 3 Encounters:  07/21/20 101/66  01/21/20 110/64  11/21/19 (!) 141/89   . Pharmacist Clinical Goal(s): o Over the next 60 days, patient will work with PharmD and providers to achieve BP goal <130/80 . Current regimen:  o Amlodipine 5 mg qd . Interventions: o Praised patient for decreased drug/alcohol and cigarette use.  o Provided diet and exercise counseling. o Will discuss addition of  medications to preserve kidney function with PCP . Patient self care activities - Over the next 60 days, patient will: o Check BP 2-3 times weekly, document, and provide at future appointments o Ensure daily salt intake < 2300 mg/day o Focus on diet and increasing exercise   COPD/Chronic bronchitis . Pharmacist Clinical Goal(s) o Over the next 60 days, patient will work with PharmD and providers to eliminate financial barriers, reduce symptoms and optimize quality of life  . Current regimen:  o Stiolto 2 puffs daily o Albuterol 1-2 puffs q6h prn . Interventions: o Assisted patient in completing PAP  application for [place] for albuterol options o Assisted patient is scheduling COVID vaccination appointments (1st dose tomorrow) o Encouraged smoking cessation and praised patient for cutting back. Will continue to offer assistance. . Patient self care activities - Over the next 60 days, patient  will: o Contact PCP or PharmD if Stiolto sample needed prior to PAP received. o Continue focusing on self care with healthy meals, adequate sleep and exercise. o Avoid triggers.  Medication management . Pharmacist Clinical Goal(s): o Over the next 60 days, patient will work with PharmD and providers to achieve optimal medication adherence . Current pharmacy: [facility] . Interventions o Comprehensive medication review performed. o Continue current medication management strategy . Patient self care activities - Over the next 60 days, patient will: o Focus on medication adherence by fill dates o Take medications as prescribed o Report any questions or concerns to PharmD and/or provider(s)  Initial goal documentation     . Stop or Cut Down Tobacco Use       Timeframe:  Short-Term Goal Priority:  High Start Date:                             Expected End Date:                       Follow Up Date 2 month follow up    - change or avoid triggers like smoky places, drinking alcohol and other smokers - cut down number of cigarettes by one-half - drink 4-6 glasses of water each day - join a support group - use prescription medicine to decrease cravings and withdrawal - use Quit [employer] Now    Why is this important?    To stop or cut down it is important to  have support from a person or group of people who you can count on.   You will also need to think about the things that make you feel like smoking, then plan for how to handle them.    Notes:     . Track and Manage My Blood Pressure-Hypertension       Timeframe:  Short-Term Goal Priority:  High Start Date:                             Expected End Date:                       Follow Up Date 2 month follow up    - check blood pressure 3 times per week - choose a place to take my blood pressure (home, clinic or office, retail store) - write blood pressure results in a log or diary    Why is this  important?    You won't feel high blood pressure, but it can still hurt your blood vessels.   High blood pressure can cause heart or kidney problems. It can also cause a stroke.   Making lifestyle changes like losing a little weight or eating less salt will help.   Checking your blood pressure at home and at different times of the day can help to control blood pressure.   If the doctor prescribes medicine remember to take it the way the doctor ordered.   Call the office if you cannot afford the medicine or if there are questions about it.     Notes:        The patient verbalized understanding of instructions, educational materials, and care plan provided today and declined offer to receive copy of patient instructions, educational materials, and care plan.   Telephone follow up appointment with pharmacy team member scheduled for: 4 weeks  Junita Push. Kenton Kingfisher PharmD, BCPS Clinical Pharmacist [phone]  Managing the Challenge of Quitting Smoking Quitting smoking is a physical and mental challenge. You will face cravings, withdrawal symptoms, and temptation. Before quitting, work with your health care provider to make a plan that can help you manage quitting. Preparation can help you quit and keep you from giving in. How to manage lifestyle changes Managing stress Stress can make you want to smoke, and wanting to smoke may cause stress. It is important to find ways to manage your stress. You might try some of the following:  Practice relaxation techniques. ? Breathe slowly and deeply, in through your nose and out through your mouth. ? Listen to music. ? Soak in a bath or take a shower. ? Imagine a peaceful place or vacation.  Get some support. ? Talk with family or friends about your stress. ? Join a support group. ? Talk with a counselor or therapist.  Get some physical activity. ? Go for a walk, run, or bike ride. ? Play a favorite sport. ? Practice yoga.    Medicines Talk with your health care provider about medicines that might help you deal with cravings and make quitting easier for you. Relationships Social situations can be difficult when you are quitting smoking. To manage this, you can:  Avoid parties and other social situations where people might be smoking.  Avoid alcohol.  Leave right away if you have the urge to smoke.  Explain to your family and friends that you are quitting smoking. Ask for support and let them know  you might be a bit grumpy.  Plan activities where smoking is not an option. General instructions Be aware that many people gain weight after they quit smoking. However, not everyone does. To keep from gaining weight, have a plan in place before you quit and stick to the plan after you quit. Your plan should include:  Having healthy snacks. When you have a craving, it may help to: ? Eat popcorn, carrots, celery, or other cut vegetables. ? Chew sugar-free gum.  Changing how you eat. ? Eat small portion sizes at meals. ? Eat 4-6 small meals throughout the day instead of 1-2 large meals a day. ? Be mindful when you eat. Do not watch television or do other things that might distract you as you eat.  Exercising regularly. ? Make time to exercise each day. If you do not have time for a long workout, do short bouts of exercise for 5-10 minutes several times a day. ? Do some form of strengthening exercise, such as weight lifting. ? Do some exercise that gets your heart beating and causes you to breathe deeply, such as walking fast, running, swimming, or biking. This is very important.  Drinking plenty of water or other low-calorie or no-calorie drinks. Drink 6-8 glasses of water daily.   How to recognize withdrawal symptoms Your body and mind may experience discomfort as you try to get used to not having nicotine in your system. These effects are called withdrawal symptoms. They may include:  Feeling hungrier than  normal.  Having trouble concentrating.  Feeling irritable or restless.  Having trouble sleeping.  Feeling depressed.  Craving a cigarette. To manage withdrawal symptoms:  Avoid places, people, and activities that trigger your cravings.  Remember why you want to quit.  Get plenty of sleep.  Avoid coffee and other caffeinated drinks. These may worsen some of your symptoms. These symptoms may surprise you. But be assured that they are normal to have when quitting smoking. How to manage cravings Come up with a plan for how to deal with your cravings. The plan should include the following:  A definition of the specific situation you want to deal with.  An alternative action you will take.  A clear idea for how this action will help.  The name of someone who might help you with this. Cravings usually last for 5-10 minutes. Consider taking the following actions to help you with your plan to deal with cravings:  Keep your mouth busy. ? Chew sugar-free gum. ? Suck on hard candies or a straw. ? Brush your teeth.  Keep your hands and body busy. ? Change to a different activity right away. ? Squeeze or play with a ball. ? Do an activity or a hobby, such as making bead jewelry, practicing needlepoint, or working with wood. ? Mix up your normal routine. ? Take a short exercise break. Go for a quick walk or run up and down stairs.  Focus on doing something kind or helpful for someone else.  Call a friend or family member to talk during a craving.  Join a support group.  Contact a quitline. Where to find support To get help or find a support group:  Call the [facility]'s Smoking Quitline: 1-800-QUIT NOW (510) 122-9915)  Visit the website of the Substance Abuse and [place]: ktimeonline.com  Text QUIT to SmokefreeTXT: 323557 Where to find more information Visit these websites to find more information on quitting smoking:  [place]: www.smokefree.gov  American Lung Association: www.lung.org  American Cancer Society: www.cancer.org  Centers for Disease Control and Prevention: http://www.wolf.info/  American Heart Association: www.heart.org Contact a health care provider if:  You want to change your plan for quitting.  The medicines you are taking are not helping.  Your eating feels out of control or you cannot sleep. Get help right away if:  You feel depressed or become very anxious. Summary  Quitting smoking is a physical and mental challenge. You will face cravings, withdrawal symptoms, and temptation to smoke again. Preparation can help you as you go through these challenges.  Try different techniques to manage stress, handle social situations, and prevent weight gain.  You can deal with cravings by keeping your mouth busy (such as by chewing gum), keeping your hands and body busy, calling family or friends, or contacting a quitline for people who want to quit smoking.  You can deal with withdrawal symptoms by avoiding places where people smoke, getting plenty of rest, and avoiding drinks with caffeine. This information is not intended to replace advice given to you by your health care provider. Make sure you discuss any questions you have with your health care provider. Document Revised: 01/01/2019 Document Reviewed: 01/01/2019 Elsevier Patient Education  [place].

## 2020-08-06 NOTE — Telephone Encounter (Signed)
Picked up copy of utility binvoice from patient and emailed to care guide Jeronimo Norma per patient's request.

## 2020-08-10 ENCOUNTER — Telehealth: Payer: Self-pay | Admitting: *Deleted

## 2020-08-10 NOTE — Telephone Encounter (Signed)
   Telephone encounter was:  Successful.  08/10/2020 Name: Kristen Mills MRN: [id number] DOB: [birth year]  [name] is a 70 y.o. year old female who is a primary care patient of Cannady, Barbaraann Faster, NP . The community resource team was consulted for assistance with Transportation Needs , Food Insecurity and Financial Difficulties related to utilities  Care guide performed the following interventions: Patient provided with information about care guide support team and interviewed to confirm resource needs Follow up call placed to the patient to discuss status of referral.  Follow Up Plan:  No further follow up planned at this time. The patient has been provided with needed resources.  [place], Care Management  [phone] [address] , [place] [address] Email : [email or web address]

## 2020-08-14 ENCOUNTER — Telehealth: Payer: Self-pay

## 2020-09-02 ENCOUNTER — Other Ambulatory Visit: Payer: PPO

## 2020-09-02 ENCOUNTER — Other Ambulatory Visit: Payer: Self-pay

## 2020-09-02 DIAGNOSIS — E559 Vitamin D deficiency, unspecified: Secondary | ICD-10-CM | POA: Diagnosis not present

## 2020-09-02 DIAGNOSIS — R7989 Other specified abnormal findings of blood chemistry: Secondary | ICD-10-CM

## 2020-09-02 DIAGNOSIS — N1831 Chronic kidney disease, stage 3a: Secondary | ICD-10-CM | POA: Diagnosis not present

## 2020-09-02 LAB — MICROALBUMIN, URINE WAIVED
Creatinine, Urine Waived: 200 mg/dL (ref 10–300)
Microalb, Ur Waived: 150 mg/L — ABNORMAL HIGH (ref 0–19)

## 2020-09-03 ENCOUNTER — Telehealth: Payer: Self-pay | Admitting: Pharmacist

## 2020-09-03 ENCOUNTER — Other Ambulatory Visit: Payer: Self-pay | Admitting: Nurse Practitioner

## 2020-09-03 ENCOUNTER — Encounter: Payer: Self-pay | Admitting: Nurse Practitioner

## 2020-09-03 DIAGNOSIS — E039 Hypothyroidism, unspecified: Secondary | ICD-10-CM | POA: Insufficient documentation

## 2020-09-03 DIAGNOSIS — N1832 Chronic kidney disease, stage 3b: Secondary | ICD-10-CM

## 2020-09-03 LAB — BASIC METABOLIC PANEL
BUN/Creatinine Ratio: 13 (ref 12–28)
BUN: 23 mg/dL (ref 8–27)
CO2: 18 mmol/L — ABNORMAL LOW (ref 20–29)
Calcium: 9.7 mg/dL (ref 8.7–10.3)
Chloride: 100 mmol/L (ref 96–106)
Creatinine, Ser: 1.81 mg/dL — ABNORMAL HIGH (ref 0.57–1.00)
Glucose: 105 mg/dL — ABNORMAL HIGH (ref 65–99)
Potassium: 4.8 mmol/L (ref 3.5–5.2)
Sodium: 136 mmol/L (ref 134–144)
eGFR: 30 mL/min/{1.73_m2} — ABNORMAL LOW (ref >59)

## 2020-09-03 LAB — TSH: TSH: 6.73 u[IU]/mL — ABNORMAL HIGH (ref 0.450–4.500)

## 2020-09-03 LAB — T4, FREE: Free T4: 1.13 ng/dL (ref 0.82–1.77)

## 2020-09-03 LAB — VITAMIN D 25 HYDROXY (VIT D DEFICIENCY, FRACTURES): Vit D, 25-Hydroxy: 32.6 ng/mL (ref 30.0–100.0)

## 2020-09-03 MED ORDER — LEVOTHYROXINE SODIUM 25 MCG PO TABS: 25.0000 ug | ORAL_TABLET | Freq: Every day | ORAL | 3 refills | Status: DC

## 2020-09-03 NOTE — Progress Notes (Signed)
Please let Khalie know her labs have returned: - Vitamin D level has improved, continue supplement - Thyroid level, TSH, has trended up to >6 and Free T4 remains in normal range.  At this time I would like to start a little medication for hypothyroid.  This is called Levothyroxine and I will send in 25 MCG for her to take daily.  I would like her to schedule visit to see me in 6 weeks to follow-up on this. - Kidney function has trended down a little more, I would like her to see kidney doctor and will place referral for this.  They will call her to schedule, please ensure to listen out for phone call and ensure good water intake daily.  Any questions? Keep being awesome!!  Thank you for allowing me to participate in your care.  I appreciate you. Kindest regards, Xzavian Semmel

## 2020-09-03 NOTE — Chronic Care Management (AMB) (Signed)
    Chronic Care Management Pharmacy Assistant   Name: Kristen Mills  MRN: [id number] DOB: June 16, 1950    Reason for Encounter: Disease State-COPD     Recent office visits:  None noted  Recent consult visits:  None noted  Hospital visits:  None in previous 6 months  Medications: Outpatient Encounter Medications as of 09/03/2020  Medication Sig   albuterol (VENTOLIN HFA) 108 (90 Base) MCG/ACT inhaler INHALE 2 PUFFS EVERY FOUR TO SIX HOURS AS NEEDED FOR SHORTNESS OF BREATH.   amLODipine (NORVASC) 5 MG tablet Take 1 tablet (5 mg total) by mouth daily.   atorvastatin (LIPITOR) 10 MG tablet Take 1 tablet (10 mg total) by mouth daily at 6 PM.   buPROPion (WELLBUTRIN XL) 150 MG 24 hr tablet Take 300 MG (2 tablets) once daily by mouth.   Cholecalciferol (VITAMIN D3) 1.25 MG (50000 UT) CAPS TAKE 1 CAPSULE BY MOUTH ONCE A WEEK.   gabapentin (NEURONTIN) 300 MG capsule Take 1 capsule (300 mg total) by mouth 3 (three) times daily.   hydrOXYzine (ATARAX/VISTARIL) 10 MG tablet TAKE 1 TABLET BY MOUTH TWICE A DAY AS NEEDED   Tiotropium Bromide-Olodaterol 2.5-2.5 MCG/ACT AERS Inhale 2 puffs into the lungs daily.   traZODone (DESYREL) 50 MG tablet Take 1 tablet (50 mg total) by mouth at bedtime.   No facility-administered encounter medications on file as of 09/03/2020.   Current COPD regimen:  Stiolto 2 puffs daily Albuterol 1-2 puffs q6h prn  No flowsheet data found.  Any recent hospitalizations or ED visits since last visit with CPP? No  Denies COPD symptoms, including Increased shortness of breath , Rescue medicine is not helping, Shortness of breath at rest, Symptoms worse with exercise, Symptoms worse at night, and Wheezing  What recent interventions/DTPs have been made by any provider to improve breathing since last visit:none noted  Have you had exacerbation/flare-up since last visit? Yes  What do you do when you are short of breath?  Rescue medication  Respiratory  Devices/Equipment Do you have a nebulizer? No Do you use a Peak Flow Meter? No Do you use a maintenance inhaler? Yes How often do you forget to use your daily inhaler? Never Do you use a rescue inhaler? Yes How often do you use your rescue inhaler?  daily Do you use a spacer with your inhaler? No  Adherence Review: Does the patient have >5 day gap between last estimated fill date for maintenance inhaler medications? No    Star Rating Drugs: Atorvastatin 10 mg last filled 06/27/20 90 DS   [facility] Clinical Pharmacist Assistant [phone]

## 2020-10-20 ENCOUNTER — Other Ambulatory Visit: Payer: Self-pay

## 2020-10-20 ENCOUNTER — Ambulatory Visit (INDEPENDENT_AMBULATORY_CARE_PROVIDER_SITE_OTHER): Payer: PPO | Admitting: Nurse Practitioner

## 2020-10-20 ENCOUNTER — Encounter: Payer: Self-pay | Admitting: Nurse Practitioner

## 2020-10-20 VITALS — BP 109/70 | HR 94 | Temp 98.8°F | Wt 144.6 lb

## 2020-10-20 DIAGNOSIS — E039 Hypothyroidism, unspecified: Secondary | ICD-10-CM

## 2020-10-20 DIAGNOSIS — N1831 Chronic kidney disease, stage 3a: Secondary | ICD-10-CM | POA: Diagnosis not present

## 2020-10-20 DIAGNOSIS — Z599 Problem related to housing and economic circumstances, unspecified: Secondary | ICD-10-CM | POA: Diagnosis not present

## 2020-10-20 DIAGNOSIS — F1410 Cocaine abuse, uncomplicated: Secondary | ICD-10-CM | POA: Diagnosis not present

## 2020-10-20 NOTE — Assessment & Plan Note (Signed)
Referral to community care team.

## 2020-10-20 NOTE — Assessment & Plan Note (Signed)
Chronic, ongoing.  Repeat BMP today.  No current ACE or ARB, due to severe COPD would lean towards ARB == will stop Amlodipine and change BP regimen to Losartan 25 MG daily which will offer kidney protection with her CKD and proteinuria. Scheduled to see nephrology. Recommend complete cessation of cocaine use.

## 2020-10-20 NOTE — Patient Instructions (Signed)
Stop Amlodipine and start Losartan 25 MG daily.  Chronic Kidney Disease, Adult Chronic kidney disease is when lasting damage happens to the kidneys slowly over a long time. The kidneys help to: Make pee (urine). Make hormones. Keep the right amount of fluids and chemicals in the body. Most often, this disease does not go away. You must take steps to help keep the kidney damage from getting worse. If steps are not taken, the kidneys mightstop working forever. What are the causes? Diabetes. High blood pressure. Diseases that affect the heart and blood vessels. Other kidney diseases. Diseases of the body's disease-fighting system. A problem with the flow of pee. Infections of the organs that make pee, store it, and take it out of the body. Swelling or irritation of your blood vessels. What increases the risk? Getting older. Having someone in your family who has kidney disease or kidney failure. Having a disease caused by genes. Taking medicines often that harm the kidneys. Being near or having contact with harmful substances. Being very overweight. Using tobacco now or in the past. What are the signs or symptoms? Feeling very tired. Having a swollen face, legs, ankles, or feet. Feeling like you may vomit or vomiting. Not feeling hungry. Being confused or not able to focus. Twitches and cramps in the leg muscles or other muscles. Dry, itchy skin. A taste of metal in your mouth. Making less pee, or making more pee. Shortness of breath. Trouble sleeping. You may also become anemic or get weak bones. Anemic means there is not enoughred blood cells or hemoglobin in your blood. You may get symptoms slowly. You may not notice them until the kidney damagegets very bad. How is this treated? Often, there is no cure for this disease. Treatment can help with symptoms and help keep the disease from getting worse. You may need to: Avoid alcohol. Avoid foods that are high in salt, potassium,  phosphorous, and protein. Take medicines for symptoms and to help control other conditions. Have dialysis. This treatment gets harmful waste out of your body. Treat other problems that cause your kidney disease or make it worse. Follow these instructions at home: Medicines Take over-the-counter and prescription medicines only as told by your doctor. Do not take any new medicines, vitamins, or supplements unless your doctor says it is okay. Lifestyle  Do not smoke or use any products that contain nicotine or tobacco. If you need help quitting, ask your doctor. If you drink alcohol: Limit how much you use to: 0-1 drink a day for women who are not pregnant. 0-2 drinks a day for men. Know how much alcohol is in your drink. In the [place] one drink equals one 12 oz bottle of beer (355 mL), one 5 oz glass of wine (148 mL), or one 1 oz glass of hard liquor (44 mL). Stay at a healthy weight. If you need help losing weight, ask your doctor.  General instructions  Follow instructions from your doctor about what you cannot eat or drink. Track your blood pressure at home. Tell your doctor about any changes. If you have diabetes, track your blood sugar. Exercise at least 30 minutes a day, 5 days a week. Keep your shots (vaccinations) up to date. Keep all follow-up visits.  Where to find more information American Association of Kidney Patients: BombTimer.gl National Kidney Foundation: www.kidney.[place]: https://mathis.com/ Life Options: www.lifeoptions.org Kidney School: www.kidneyschool.org Contact a doctor if: Your symptoms get worse. You get new symptoms. Get help right away if:  You get symptoms of end-stage kidney disease. These include: Headaches. Losing feeling in your hands or feet. Easy bruising. Having hiccups often. Chest pain. Shortness of breath. Lack of menstrual periods, in women. You have a fever. You make less pee than normal. You have pain or you bleed  when you pee or poop. These symptoms may be an emergency. Get help right away. Call your local emergency services (911 in the [place] Do not wait to see if the symptoms will go away. Do not drive yourself to the hospital. Summary Chronic kidney disease is when lasting damage happens to the kidneys slowly over a long time. Causes of this disease include diabetes and high blood pressure. Often, there is no cure for this disease. Treatment can help symptoms and help keep the disease from getting worse. Treatment may involve lifestyle changes, medicines, and dialysis. This information is not intended to replace advice given to you by your health care provider. Make sure you discuss any questions you have with your healthcare provider. Document Revised: 06/19/2019 Document Reviewed: 06/19/2019 Elsevier Patient Education  [place].

## 2020-10-20 NOTE — Assessment & Plan Note (Signed)
Started medication on 09/03/20.  Current Levothyroxine 25 MCG daily, continue this and adjust as needed.  Recheck TSH, Free T4, and thyroid antibody check today.

## 2020-10-20 NOTE — Assessment & Plan Note (Signed)
Had been cutting back on use with Wellbutrin on board.  Praised for reduction of use and recommend complete cessation.  Suspect this is reason BP is trending downwards, discussed with her, as is using less cocaine and alcohol.  Have discussed at length high risk for cardiac death with continued cocaine use.

## 2020-10-20 NOTE — Progress Notes (Signed)
BP 109/70   Pulse 94   Temp 98.8 F (37.1 C) (Oral)   Wt 144 lb 9.6 oz (65.6 kg)   SpO2 95%   BMI 27.32 kg/m    Subjective:    Patient ID: Kristen Mills, female    DOB: 1950-11-28, 70 y.o.   MRN: [id number]  HPI: Kristen Mills is a 70 y.o. female  Chief Complaint  Patient presents with   Chronic Kidney Disease   Thyroid Problem   CHRONIC KIDNEY DISEASE Has appointment September 15th to see kidney doctor.  Is a cocaine user -- long term -- last used Saturday.  Urine last visit noted protein 150. CKD status: stable Medications renally dose: yes Previous renal evaluation: no Pneumovax:  Up to Date Influenza Vaccine:  Up to Date   HYPOTHYROIDISM Started on Levothyroxine 25 MCG six weeks ago and is tolerating well. Thyroid control status:stable Satisfied with current treatment? yes Medication side effects: no Medication compliance: good compliance Etiology of hypothyroidism:  Recent dose adjustment:no Fatigue: yes Cold intolerance: no Heat intolerance: no Weight gain: no Weight loss: no Constipation: no Diarrhea/loose stools: no Palpitations: no Lower extremity edema: no Anxiety/depressed mood: no   Relevant past medical, surgical, family and social history reviewed and updated as indicated. Interim medical history since our last visit reviewed. Allergies and medications reviewed and updated.  Review of Systems  Constitutional:  Negative for activity change, appetite change, diaphoresis, fatigue and fever.  Respiratory:  Positive for shortness of breath (occasional at baseline) and wheezing (occasional at baseline). Negative for cough and chest tightness.   Cardiovascular:  Negative for chest pain, palpitations and leg swelling.  Gastrointestinal: Negative.   Neurological:  Negative for dizziness, syncope, weakness, light-headedness, numbness and headaches.  Psychiatric/Behavioral:  Negative for decreased concentration, self-injury, sleep disturbance and  suicidal ideas. The patient is not nervous/anxious.    Per HPI unless specifically indicated above     Objective:    BP 109/70   Pulse 94   Temp 98.8 F (37.1 C) (Oral)   Wt 144 lb 9.6 oz (65.6 kg)   SpO2 95%   BMI 27.32 kg/m   Wt Readings from Last 3 Encounters:  10/20/20 144 lb 9.6 oz (65.6 kg)  07/21/20 147 lb 6.4 oz (66.9 kg)  01/21/20 147 lb 12.8 oz (67 kg)    Physical Exam Vitals and nursing note reviewed.  Constitutional:      General: She is awake. She is not in acute distress.    Appearance: She is well-developed and overweight. She is not ill-appearing.  HENT:     Head: Normocephalic.     Right Ear: Hearing normal.     Left Ear: Hearing normal.  Eyes:     General: Lids are normal.        Right eye: No discharge.        Left eye: No discharge.     Conjunctiva/sclera: Conjunctivae normal.     Pupils: Pupils are equal, round, and reactive to light.  Neck:     Thyroid: No thyromegaly.     Vascular: No carotid bruit.  Cardiovascular:     Rate and Rhythm: Normal rate and regular rhythm.     Heart sounds: Normal heart sounds. No murmur heard.   No gallop.  Pulmonary:     Effort: Pulmonary effort is normal.     Breath sounds: Decreased breath sounds present.     Comments: Clear throughout with diminished breath sounds, no wheezes or rhonchi (baseline). Abdominal:  General: Bowel sounds are normal.     Palpations: Abdomen is soft.  Musculoskeletal:     Cervical back: Normal range of motion and neck supple.     Right lower leg: No edema.     Left lower leg: No edema.  Skin:    General: Skin is warm and dry.  Neurological:     Mental Status: She is alert and oriented to person, place, and time.  Psychiatric:        Attention and Perception: Attention normal.        Mood and Affect: Mood normal.        Speech: Speech normal.        Behavior: Behavior normal. Behavior is cooperative.        Thought Content: Thought content normal.    Results for orders  placed or performed in visit on 09/02/20  Microalbumin, Urine Waived  Result Value Ref Range   Microalb, Ur Waived 150 (H) 0 - 19 mg/L   Creatinine, Urine Waived 200 10 - 300 mg/dL   Microalb/Creat Ratio 30-300 (H) <30 mg/g  VITAMIN D 25 Hydroxy (Vit-D Deficiency, Fractures)  Result Value Ref Range   Vit D, 25-Hydroxy 32.6 30.0 - 100.0 ng/mL  T4, free  Result Value Ref Range   Free T4 1.13 0.82 - 1.77 ng/dL  TSH  Result Value Ref Range   TSH 6.730 (H) 0.450 - 4.500 uIU/mL  Basic metabolic panel  Result Value Ref Range   Glucose 105 (H) 65 - 99 mg/dL   BUN 23 8 - 27 mg/dL   Creatinine, Ser 1.81 (H) 0.57 - 1.00 mg/dL   eGFR 30 (L) >59 mL/min/1.73   BUN/Creatinine Ratio 13 12 - 28   Sodium 136 134 - 144 mmol/L   Potassium 4.8 3.5 - 5.2 mmol/L   Chloride 100 96 - 106 mmol/L   CO2 18 (L) 20 - 29 mmol/L   Calcium 9.7 8.7 - 10.3 mg/dL      Assessment & Plan:   Problem List Items Addressed This Visit       Endocrine   Hypothyroid    Started medication on 09/03/20.  Current Levothyroxine 25 MCG daily, continue this and adjust as needed.  Recheck TSH, Free T4, and thyroid antibody check today.       Relevant Orders   T4, free   TSH   Thyroid peroxidase antibody     Genitourinary   CKD (chronic kidney disease) stage 3, GFR 30-59 ml/min (HCC) - Primary    Chronic, ongoing.  Repeat BMP today.  No current ACE or ARB, due to severe COPD would lean towards ARB == will stop Amlodipine and change BP regimen to Losartan 25 MG daily which will offer kidney protection with her CKD and proteinuria. Scheduled to see nephrology. Recommend complete cessation of cocaine use.       Relevant Orders   Basic metabolic panel     Other   Cocaine abuse ([place])    Had been cutting back on use with Wellbutrin on board.  Praised for reduction of use and recommend complete cessation.  Suspect this is reason BP is trending downwards, discussed with her, as is using less cocaine and alcohol.  Have  discussed at length high risk for cardiac death with continued cocaine use.       Financial difficulties    Referral to community care team.       Relevant Orders   AMB Referral to Helper  Follow up plan: Return in about 3 months (around 01/20/2021) for CKD, THYROID, HTN.

## 2020-10-21 ENCOUNTER — Telehealth: Payer: Self-pay | Admitting: Nurse Practitioner

## 2020-10-21 LAB — T4, FREE: Free T4: 1.23 ng/dL (ref 0.82–1.77)

## 2020-10-21 LAB — BASIC METABOLIC PANEL
BUN/Creatinine Ratio: 9 — ABNORMAL LOW (ref 12–28)
BUN: 12 mg/dL (ref 8–27)
CO2: 23 mmol/L (ref 20–29)
Calcium: 10 mg/dL (ref 8.7–10.3)
Chloride: 100 mmol/L (ref 96–106)
Creatinine, Ser: 1.32 mg/dL — ABNORMAL HIGH (ref 0.57–1.00)
Glucose: 102 mg/dL — ABNORMAL HIGH (ref 65–99)
Potassium: 5 mmol/L (ref 3.5–5.2)
Sodium: 139 mmol/L (ref 134–144)
eGFR: 44 mL/min/{1.73_m2} — ABNORMAL LOW (ref >59)

## 2020-10-21 LAB — THYROID PEROXIDASE ANTIBODY: Thyroperoxidase Ab SerPl-aCnc: <8 IU/mL (ref 0–34)

## 2020-10-21 LAB — TSH: TSH: 3.65 u[IU]/mL (ref 0.450–4.500)

## 2020-10-21 NOTE — Telephone Encounter (Signed)
   Telephone encounter was:  Successful.  10/21/2020 Name: MADONNA TRATHEN MRN: [id number] DOB: 1950-08-05  LAVONN PAPKA is a 70 y.o. year old female who is a primary care patient of Cannady, Barbaraann Faster, NP . The community resource team was consulted for assistance with [place] guide performed the following interventions: Patient provided with information about care guide support team and interviewed to confirm resource needs Discussed resources to assist with food. Sent request for grocery funds and will send a list of local food banks .  Follow Up Plan:  Care guide will follow up with patient by phone over the next week.  April Green Care Guide, Embedded Care Coordination [place], Care Management Phone: [phone] Email: [email or web address]

## 2020-10-21 NOTE — Progress Notes (Signed)
Good afternoon, please let Kristen Mills know her thyroid lab has improved to normal range, continue Levothyroxine daily.  Continues to show kidney disease, but is improved from previous.  Ensure to start Losartan and stop Amlodipine when able to pick up and keep visit with kidney doctor.  Thank you. Keep being amazing!!  Thank you for allowing me to participate in your care.  I appreciate you. Kindest regards, Bricelyn [place]estone

## 2020-10-26 ENCOUNTER — Telehealth: Payer: Self-pay | Admitting: Nurse Practitioner

## 2020-10-26 NOTE — Telephone Encounter (Signed)
   Telephone encounter was:  Successful.  10/26/2020 Name: Kristen Mills MRN: [id number] DOB: 26-Jun-1950  Kristen Mills is a 70 y.o. year old female who is a primary care patient of Cannady, Barbaraann Faster, NP . The community resource team was consulted for assistance with [place] guide performed the following interventions: Follow up call placed to the patient to discuss status of referral Advised patient she would be getting a [employer] card for groceries and Jeananne Rama would be calling for her to come pick it up. I will follow up with [facility] to make sure they received it before closing referral .  Follow Up Plan:  No further follow up planned at this time. The patient has been provided with needed resources.  April Green Care Guide, Embedded Care Coordination [place], Care Management Phone: [phone] Email: [email or web address]

## 2020-10-27 ENCOUNTER — Telehealth: Payer: Self-pay | Admitting: *Deleted

## 2020-10-27 NOTE — Telephone Encounter (Signed)
pT was notified that gift card ([employer]) was here. She will come pick up

## 2020-11-12 ENCOUNTER — Telehealth: Payer: Self-pay

## 2020-11-12 NOTE — Telephone Encounter (Signed)
Copied from [place] [id number]. Topic: General - Inquiry >> Nov 11, 2020  4:33 PM Loma [place] wrote: Reason for CRM: pt called wanting to know how to reach Almyra Free who helped her with a mail order plan for meds. She states that specfic drugs were ordered via mail order for her and she would come pu. Wants to get started back with this program if still in effect. Called office and not familiar. Pls fu with pt as needing an inhaler [phone]   Routing to Pharm D for patient assistance.

## 2020-11-20 ENCOUNTER — Telehealth: Payer: Self-pay

## 2020-11-20 NOTE — Telephone Encounter (Signed)
Patient would like to know while she is awaiting a call back regarding the below does PCP have any samples of STIOLTO RESPIMAT inhaler

## 2020-11-20 NOTE — Chronic Care Management (AMB) (Signed)
    Chronic Care Management Pharmacy Assistant   Name: Kristen Mills  MRN: [id number] DOB: 1950-08-24   Reason for Encounter: Patient wanting to start back on Patient Assistance application for Stiolto Inhaler.    Medications: Outpatient Encounter Medications as of 11/20/2020  Medication Sig   albuterol (VENTOLIN HFA) 108 (90 Base) MCG/ACT inhaler INHALE 2 PUFFS EVERY FOUR TO SIX HOURS AS NEEDED FOR SHORTNESS OF BREATH.   atorvastatin (LIPITOR) 10 MG tablet Take 1 tablet (10 mg total) by mouth daily at 6 PM.   buPROPion (WELLBUTRIN XL) 150 MG 24 hr tablet Take 300 MG (2 tablets) once daily by mouth.   Cholecalciferol (VITAMIN D3) 1.25 MG (50000 UT) CAPS TAKE 1 CAPSULE BY MOUTH ONCE A WEEK.   gabapentin (NEURONTIN) 300 MG capsule Take 1 capsule (300 mg total) by mouth 3 (three) times daily.   hydrOXYzine (ATARAX/VISTARIL) 10 MG tablet TAKE 1 TABLET BY MOUTH TWICE A DAY AS NEEDED   levothyroxine (SYNTHROID) 25 MCG tablet Take 1 tablet (25 mcg total) by mouth daily.   Tiotropium Bromide-Olodaterol 2.5-2.5 MCG/ACT AERS Inhale 2 puffs into the lungs daily.   traZODone (DESYREL) 50 MG tablet Take 1 tablet (50 mg total) by mouth at bedtime.   No facility-administered encounter medications on file as of 11/20/2020.    Patient would like to start back up on her Patient assistance application for Stiolto Inhaler. I looked at her application it looks like she is approved until 03/27/21. I have called the manufacture they have a script of 90 days supply of the Stiolto inhaler that will be sent out tomorrow on 11/24/20 and will arrive to patient household in 3-5 business days. Patient is aware. Patient also states she is suppose to stop taking her Amlodipine and replace with Losartan but she did not receive her Losartan prescription at the pharmacy, she has not stopped the amlodipine due to this issue.    Corrie Mckusick, [place]

## 2020-11-20 NOTE — Telephone Encounter (Signed)
Patient called back requesting follow up.  Please follow up.

## 2020-11-23 NOTE — Telephone Encounter (Signed)
[place] for patient to be given samples?

## 2020-11-23 NOTE — Telephone Encounter (Signed)
Spoke with patient and notified her of Jolene's recommendations. Patient was informed that we are open every day until 5 pm. Patient verbalized understanding and has no further questions.

## 2020-11-24 NOTE — Addendum Note (Signed)
Addended by: Georgina Peer on: 11/24/2020 01:07 PM   Modules accepted: Orders

## 2020-11-24 NOTE — Telephone Encounter (Signed)
Manuela Schwartz from [facility] [address] [place] fax # [phone] phone # [phone] option [phone] requesting PCP send STIOLTO RESPIMAT inhaler to pharmacy.

## 2020-11-24 NOTE — Telephone Encounter (Signed)
Noted  

## 2020-11-25 ENCOUNTER — Other Ambulatory Visit: Payer: Self-pay | Admitting: Nurse Practitioner

## 2020-11-25 MED ORDER — TELMISARTAN 40 MG PO TABS: 40.0000 mg | ORAL_TABLET | Freq: Every day | ORAL | 4 refills | Status: DC

## 2020-12-03 ENCOUNTER — Other Ambulatory Visit: Payer: Self-pay | Admitting: Nurse Practitioner

## 2020-12-03 MED ORDER — TIOTROPIUM BROMIDE-OLODATEROL 2.5-2.5 MCG/ACT IN AERS: 2.0000 | INHALATION_SPRAY | Freq: Every day | RESPIRATORY_TRACT | 5 refills | Status: DC

## 2020-12-03 NOTE — Telephone Encounter (Signed)
Pls FU with pt re: STIOLTO RESPIMAT inhaler. She states she was last told it would take 3 to 5 days and this was to be mailed to her, Not received and she is needing as wheezing. FU with pt [phone]

## 2020-12-03 NOTE — Addendum Note (Signed)
Addended by: Georgina Peer on: 12/03/2020 04:42 PM   Modules accepted: Orders

## 2020-12-03 NOTE — Telephone Encounter (Signed)
Routing to provider for refill

## 2020-12-04 NOTE — Telephone Encounter (Signed)
2 samples signed out for the patient. Called and let her know about them. Patient states she will come by next week to pick up.

## 2020-12-18 ENCOUNTER — Ambulatory Visit (INDEPENDENT_AMBULATORY_CARE_PROVIDER_SITE_OTHER): Payer: PPO

## 2020-12-18 DIAGNOSIS — Z0000 Encounter for general adult medical examination without abnormal findings: Secondary | ICD-10-CM | POA: Diagnosis not present

## 2020-12-18 NOTE — Progress Notes (Signed)
Subjective:   Kristen Mills is a 70 y.o. female who presents for Medicare Annual (Subsequent) preventive examination.  I connected with  Kristen Mills on 12/18/20 by an audio only telemedicine application and verified that I am speaking with the correct person using two identifiers.   I discussed the limitations, risks, security and privacy concerns of performing an evaluation and management service by telephone and the availability of in person appointments. I also discussed with the patient that there may be a patient responsible charge related to this service. The patient expressed understanding and verbally consented to this telephonic visit.  Location of Patient: Home  Location of Provider: [place] Persons Participating in Visit: Kristen Mills (Patient), Irena Reichmann ([place])  List any persons and their role that are participating in the visit with the patient.    Review of Systems     Defer to Provider        Objective:    Today's Vitals   12/18/20 1122  PainSc: 0-No pain   There is no height or weight on file to calculate BMI.  Advanced Directives 08/14/2019 06/19/2016  Does Patient Have a Medical Advance Directive? No No  Does patient want to make changes to medical advance directive? Yes (MAU/Ambulatory/Procedural Areas - Information given) -  Would patient like information on creating a medical advance directive? - Yes (ED - Information included in AVS)    Current Medications (verified) Outpatient Encounter Medications as of 12/18/2020  Medication Sig   albuterol (VENTOLIN HFA) 108 (90 Base) MCG/ACT inhaler INHALE 2 PUFFS EVERY FOUR TO SIX HOURS AS NEEDED FOR SHORTNESS OF BREATH.   atorvastatin (LIPITOR) 10 MG tablet Take 1 tablet (10 mg total) by mouth daily at 6 PM.   buPROPion (WELLBUTRIN XL) 150 MG 24 hr tablet Take 300 MG (2 tablets) once daily by mouth.   Cholecalciferol 1.25 MG (50000 UT) capsule Take 1 capsule by mouth once a week.    gabapentin (NEURONTIN) 300 MG capsule Take 1 capsule (300 mg total) by mouth 3 (three) times daily.   levothyroxine (SYNTHROID) 25 MCG tablet Take 1 tablet (25 mcg total) by mouth daily.   telmisartan (MICARDIS) 40 MG tablet Take 1 tablet (40 mg total) by mouth daily.   Tiotropium Bromide-Olodaterol 2.5-2.5 MCG/ACT AERS Inhale 2 puffs into the lungs daily.   traZODone (DESYREL) 50 MG tablet Take 1 tablet (50 mg total) by mouth at bedtime.   [DISCONTINUED] hydrOXYzine (ATARAX/VISTARIL) 10 MG tablet Take 1 tablet by mouth 2 (two) times daily as needed.   [DISCONTINUED] Cholecalciferol (VITAMIN D3) 1.25 MG (50000 UT) CAPS TAKE 1 CAPSULE BY MOUTH ONCE A WEEK.   [DISCONTINUED] hydrOXYzine (ATARAX/VISTARIL) 10 MG tablet TAKE 1 TABLET BY MOUTH TWICE A DAY AS NEEDED   No facility-administered encounter medications on file as of 12/18/2020.    Allergies (verified) Patient has no known allergies.   History: Past Medical History:  Diagnosis Date   Asthma    Cancer ([place])    Uterine Cancer   COPD (chronic obstructive pulmonary disease) ([place])    Depression    Diabetes mellitus without complication ([place])    Hyperlipidemia    Hypertension    Insomnia    Polysubstance abuse (Person)    Spinal stenosis    Thyroid condition    Past Surgical History:  Procedure Laterality Date   ABDOMINAL HYSTERECTOMY     Family History  Problem Relation Age of Onset   Cancer Mother  Lung   Cancer Father        Lung   Heart disease Sister    Heart attack Sister    Cancer Brother        Lung   Bipolar disorder Son    Social History   Socioeconomic History   Marital status: Single    Spouse name: Not on file   Number of children: Not on file   Years of education: Not on file   Highest education level: Not on file  Occupational History   Not on file  Tobacco Use   Smoking status: Every Day    Packs/day: 1.00    Years: 45.00    Pack years: 45.00    Types: Cigarettes   Smokeless tobacco: Never   Vaping Use   Vaping Use: Former  Substance and Sexual Activity   Alcohol use: Yes    Alcohol/week: 3.0 standard drinks    Types: 3 Cans of beer per week    Comment: Few beers daily.   Drug use: Yes    Types: "Crack" cocaine, Marijuana, Cocaine    Comment: currently cocaine   Sexual activity: Not Currently  Other Topics Concern   Not on file  Social History Narrative   Not on file   Social Determinants of Health   Financial Resource Strain: Medium Risk   Difficulty of Paying Living Expenses: Somewhat hard  Food Insecurity: Food Insecurity Present   Worried About [place] in the Last Year: Sometimes true   Ran Out of Food in the Last Year: Sometimes true  Transportation Needs: No Transportation Needs   Lack of Transportation (Medical): No   Lack of Transportation (Non-Medical): No  Physical Activity: Inactive   Days of Exercise per Week: 0 days   Minutes of Exercise per Session: 0 min  Stress: No Stress Concern Present   Feeling of Stress : Only a little  Social Connections: Socially Isolated   Frequency of Communication with Friends and Family: More than three times a week   Frequency of Social Gatherings with Friends and Family: More than three times a week   Attends Religious Services: Never   Marine scientist or Organizations: No   Attends Music therapist: Never   Marital Status: Divorced    Tobacco Counseling Ready to quit: Not Answered Counseling given: Not Answered   Clinical Intake:  Pre-visit preparation completed: Yes  Pain : No/denies pain Pain Score: 0-No pain     Nutritional Risks: None Diabetes: No  How often do you need to have someone help you when you read instructions, pamphlets, or other written materials from your doctor or pharmacy?: 1 - Never What is the last grade level you completed in school?: GED  Diabetic? No   Interpreter Needed?: No      Activities of Daily Living In your present state of  health, do you have any difficulty performing the following activities: 10/20/2020  Hearing? N  Vision? N  Difficulty concentrating or making decisions? N  Walking or climbing stairs? N  Dressing or bathing? N  Doing errands, shopping? N  Some recent data might be hidden    Patient Care Team: [name], NP as PCP - General (Nurse Practitioner)  Indicate any recent Medical Services you may have received from other than [facility] providers in the past year (date may be approximate).     Assessment:   This is a routine wellness examination for Kristen Mills.  Hearing/Vision screen No  results found.  Dietary issues and exercise activities discussed:     Goals Addressed   None   Depression Screen PHQ 2/9 Scores 12/18/2020 10/20/2020 07/21/2020 01/21/2020 09/25/2019 08/14/2019 08/14/2019  PHQ - 2 Score 1 3 3  0 2 4 2   PHQ- 9 Score 5 6 6 2 3  - 10    Fall Risk Fall Risk  10/20/2020 08/14/2019 02/19/2019 11/12/2018  Falls in the past year? 0 0 0 0  Comment - - Emmi Telephone Survey: data to providers prior to load -  Number falls in past yr: 0 0 - 0  Injury with Fall? 0 0 - 0  Risk for fall due to : No Fall Risks - - -  Follow up Falls evaluation completed - - Falls evaluation completed    [place]:  Any stairs in or around the home? No  If so, are there any without handrails? No  Home free of loose throw rugs in walkways, pet beds, electrical cords, etc? Yes  Adequate lighting in your home to reduce risk of falls? Yes   ASSISTIVE DEVICES UTILIZED TO PREVENT FALLS:  Life alert? No  Use of a cane, walker or w/c? No  Grab bars in the bathroom? No  Shower chair or bench in shower? No  Elevated toilet seat or a handicapped toilet? No   TIMED UP AND GO:  Was the test performed? N/A.  Length of time to ambulate 10 feet: N/A sec.     Cognitive Function:     6CIT Screen 08/14/2019  What Year? 0 points  What month? 0 points  What time? 0 points   Count back from 20 0 points  Months in reverse 0 points  Repeat phrase 0 points  Total Score 0    Immunizations Immunization History  Administered Date(s) Administered   Fluad Quad(high Dose 65+) 12/11/2018, 01/21/2020   Influenza, High Dose Seasonal PF 02/07/2018   Moderna Sars-Covid-2 Vaccination 04/30/2020   Pneumococcal Conjugate-13 02/07/2018   Pneumococcal Polysaccharide-23 08/14/2019   Tdap 06/19/2016    TDAP status: Up to date  Flu Vaccine status: Due, Education has been provided regarding the importance of this vaccine. Advised may receive this vaccine at local pharmacy or Health Dept. Aware to provide a copy of the vaccination record if obtained from local pharmacy or Health Dept. Verbalized acceptance and understanding.  Pneumococcal vaccine status: Due, Education has been provided regarding the importance of this vaccine. Advised may receive this vaccine at local pharmacy or Health Dept. Aware to provide a copy of the vaccination record if obtained from local pharmacy or Health Dept. Verbalized acceptance and understanding.  Covid-19 vaccine status: Completed vaccines  Qualifies for Shingles Vaccine? No   Zostavax completed No   Shingrix Completed?: No.    Education has been provided regarding the importance of this vaccine. Patient has been advised to call insurance company to determine out of pocket expense if they have not yet received this vaccine. Advised may also receive vaccine at local pharmacy or Health Dept. Verbalized acceptance and understanding.  Screening Tests Health Maintenance  Topic Date Due   Zoster Vaccines- Shingrix (1 of 2) Never done   COVID-19 Vaccine (2 - Moderna series) 05/28/2020   INFLUENZA VACCINE  10/26/2020   OPHTHALMOLOGY EXAM  01/20/2021 (Originally [birth year]/1962)   MAMMOGRAM  07/21/2021 (Originally 02/22/2002)   DEXA SCAN  07/21/2021 (Originally 02/22/2017)   COLONOSCOPY (Pts 45-34yrs Insurance coverage will need to be confirmed)   07/21/2021 (Originally 02/22/1997)  HEMOGLOBIN A1C  01/20/2021   TETANUS/TDAP  06/20/2026   Hepatitis C Screening  Completed   HPV VACCINES  Aged Out   FOOT EXAM  Discontinued    Health Maintenance  Health Maintenance Due  Topic Date Due   Zoster Vaccines- Shingrix (1 of 2) Never done   COVID-19 Vaccine (2 - Moderna series) 05/28/2020   INFLUENZA VACCINE  10/26/2020    Colorectal cancer screening: Referral to GI placed  . Pt aware the office will call re: appt.  Mammogram status: Ordered  . Pt provided with contact info and advised to call to schedule appt.   Bone Density status: Ordered  . Pt provided with contact info and advised to call to schedule appt.  Lung Cancer Screening: (Low Dose CT Chest recommended if Age 109-80 years, 30 pack-year currently smoking OR have quit w/in 15years.) does not qualify.   Lung Cancer Screening Referral: No  Additional Screening:  Hepatitis C Screening: does qualify; Completed 07/21/2020  Vision Screening: Recommended annual ophthalmology exams for early detection of glaucoma and other disorders of the eye. Is the patient up to date with their annual eye exam?  No  Who is the provider or what is the name of the office in which the patient attends annual eye exams?  If pt is not established with a provider, would they like to be referred to a provider to establish care? Yes .   Dental Screening: Recommended annual dental exams for proper oral hygiene  Community Resource Referral / Chronic Care Management: CRR required this visit?  No   CCM required this visit?  No      Plan:     I have personally reviewed and noted the following in the patient's chart:   Medical and social history Use of alcohol, tobacco or illicit drugs  Current medications and supplements including opioid prescriptions.  Functional ability and status Nutritional status Physical activity Advanced directives List of other physicians Hospitalizations,  surgeries, and ER visits in previous 12 months Vitals Screenings to include cognitive, depression, and falls Referrals and appointments  In addition, I have reviewed and discussed with patient certain preventive protocols, quality metrics, and best practice recommendations. A written personalized care plan for preventive services as well as general preventive health recommendations were provided to patient.  I connected with  Kristen Mills on 12/18/20 by a video enabled telemedicine application and verified that I am speaking with the correct person using two identifiers.   I discussed the limitations of evaluation and management by telemedicine. The patient expressed understanding and agreed to proceed.      Irena Reichmann, [facility]   12/18/2020   Nurse Notes: Non Face to Face 60 minutes

## 2020-12-24 NOTE — Progress Notes (Signed)
I've attempted to reach out to patient she did answer today she states she has been taking the Telmisartan and has been doing very well with it.

## 2021-01-05 ENCOUNTER — Other Ambulatory Visit: Payer: Self-pay | Admitting: Nurse Practitioner

## 2021-01-06 NOTE — Telephone Encounter (Signed)
Spoke with patient to verify if she is still taking prescription Hydroxyzine. Patient states she is still currently taking the medication. Not sure how it was removed from patient's medication list. Please advise?

## 2021-01-06 NOTE — Telephone Encounter (Signed)
Requested medications are due for refill today.  unknown  Requested medications are on the active medications list.  no  Last refill. 06/27/2020  Future visit scheduled.   yes  Notes to clinic.  Medication not on med list.

## 2021-01-14 ENCOUNTER — Encounter: Payer: Self-pay | Admitting: Nurse Practitioner

## 2021-01-14 DIAGNOSIS — R8290 Unspecified abnormal findings in urine: Secondary | ICD-10-CM | POA: Insufficient documentation

## 2021-01-14 DIAGNOSIS — F14129 Cocaine abuse with intoxication, unspecified: Secondary | ICD-10-CM | POA: Insufficient documentation

## 2021-01-14 DIAGNOSIS — R809 Proteinuria, unspecified: Secondary | ICD-10-CM | POA: Insufficient documentation

## 2021-01-14 DIAGNOSIS — J449 Chronic obstructive pulmonary disease, unspecified: Secondary | ICD-10-CM | POA: Diagnosis not present

## 2021-01-14 DIAGNOSIS — I10 Essential (primary) hypertension: Secondary | ICD-10-CM | POA: Diagnosis not present

## 2021-01-14 DIAGNOSIS — E785 Hyperlipidemia, unspecified: Secondary | ICD-10-CM | POA: Diagnosis not present

## 2021-01-14 DIAGNOSIS — F1019 Alcohol abuse with unspecified alcohol-induced disorder: Secondary | ICD-10-CM | POA: Diagnosis not present

## 2021-01-14 DIAGNOSIS — F17200 Nicotine dependence, unspecified, uncomplicated: Secondary | ICD-10-CM | POA: Insufficient documentation

## 2021-01-14 DIAGNOSIS — D692 Other nonthrombocytopenic purpura: Secondary | ICD-10-CM | POA: Insufficient documentation

## 2021-01-14 DIAGNOSIS — Z87891 Personal history of nicotine dependence: Secondary | ICD-10-CM | POA: Insufficient documentation

## 2021-01-14 DIAGNOSIS — N1832 Chronic kidney disease, stage 3b: Secondary | ICD-10-CM | POA: Diagnosis not present

## 2021-01-15 ENCOUNTER — Other Ambulatory Visit: Payer: Self-pay | Admitting: Nephrology

## 2021-01-15 DIAGNOSIS — R8290 Unspecified abnormal findings in urine: Secondary | ICD-10-CM

## 2021-01-15 DIAGNOSIS — N1832 Chronic kidney disease, stage 3b: Secondary | ICD-10-CM

## 2021-01-15 DIAGNOSIS — F1410 Cocaine abuse, uncomplicated: Secondary | ICD-10-CM

## 2021-01-15 DIAGNOSIS — F1010 Alcohol abuse, uncomplicated: Secondary | ICD-10-CM

## 2021-01-15 DIAGNOSIS — E785 Hyperlipidemia, unspecified: Secondary | ICD-10-CM

## 2021-01-15 DIAGNOSIS — I10 Essential (primary) hypertension: Secondary | ICD-10-CM

## 2021-01-20 ENCOUNTER — Other Ambulatory Visit: Payer: Self-pay

## 2021-01-20 ENCOUNTER — Ambulatory Visit (INDEPENDENT_AMBULATORY_CARE_PROVIDER_SITE_OTHER): Payer: PPO | Admitting: Nurse Practitioner

## 2021-01-20 ENCOUNTER — Encounter: Payer: Self-pay | Admitting: Nurse Practitioner

## 2021-01-20 VITALS — BP 92/64 | HR 88 | Temp 98.3°F | Wt 147.4 lb

## 2021-01-20 DIAGNOSIS — Z23 Encounter for immunization: Secondary | ICD-10-CM | POA: Diagnosis not present

## 2021-01-20 DIAGNOSIS — F1010 Alcohol abuse, uncomplicated: Secondary | ICD-10-CM | POA: Diagnosis not present

## 2021-01-20 DIAGNOSIS — N1832 Chronic kidney disease, stage 3b: Secondary | ICD-10-CM | POA: Diagnosis not present

## 2021-01-20 DIAGNOSIS — J410 Simple chronic bronchitis: Secondary | ICD-10-CM | POA: Diagnosis not present

## 2021-01-20 DIAGNOSIS — E782 Mixed hyperlipidemia: Secondary | ICD-10-CM | POA: Diagnosis not present

## 2021-01-20 DIAGNOSIS — F17219 Nicotine dependence, cigarettes, with unspecified nicotine-induced disorders: Secondary | ICD-10-CM | POA: Diagnosis not present

## 2021-01-20 DIAGNOSIS — F1410 Cocaine abuse, uncomplicated: Secondary | ICD-10-CM | POA: Diagnosis not present

## 2021-01-20 DIAGNOSIS — I10 Essential (primary) hypertension: Secondary | ICD-10-CM | POA: Diagnosis not present

## 2021-01-20 DIAGNOSIS — D692 Other nonthrombocytopenic purpura: Secondary | ICD-10-CM

## 2021-01-20 DIAGNOSIS — E039 Hypothyroidism, unspecified: Secondary | ICD-10-CM | POA: Diagnosis not present

## 2021-01-20 MED ORDER — TELMISARTAN 20 MG PO TABS: 20.0000 mg | ORAL_TABLET | Freq: Every day | ORAL | 4 refills | Status: DC

## 2021-01-20 MED ORDER — TIOTROPIUM BROMIDE-OLODATEROL 2.5-2.5 MCG/ACT IN AERS: 2.0000 | INHALATION_SPRAY | Freq: Every day | RESPIRATORY_TRACT | 4 refills | Status: DC

## 2021-01-20 NOTE — Assessment & Plan Note (Signed)
Chronic, ongoing.  Continue current medication regimen and adjust as needed.  Lipid panel up to date. 

## 2021-01-20 NOTE — Assessment & Plan Note (Signed)
Chronic, ongoing.  Continue Stiolto and Albuterol.  Recommend complete smoking cessation, continue Wellbutrin which has helped with reduction.  Refuses lung CT CA screening at this time, but provided information on this.  She does not wish to pursue pulmonary referral at this time, but may benefit from in future.  Reports "what happens happens and I do not want to know if anything is there or want treatment".  Return in 6 months for follow-up.  X 1 Stiolto sample provided in office today and will alert CCM PharmD to papers arriving + fax.  Spirometry next visit.

## 2021-01-20 NOTE — Assessment & Plan Note (Signed)
Had been cutting back on use with Wellbutrin on board.  Praised for reduction of use and recommend complete cessation.  Suspect this is reason BP is trending downwards, discussed with her, as is using less cocaine and alcohol.  Have discussed at length high risk for cardiac death with continued cocaine use.

## 2021-01-20 NOTE — Assessment & Plan Note (Signed)
Chronic, stable with BP below goal today.  Will reduce Telmisartan to 20 MG daily at this time due to tighter control.  Recommend checking BP at home 3 mornings a week and documenting for provider + DASH diet.  Labs up to date.  Recommend complete cessation of cocaine use and smoking.  Return in 6 months for follow-up.

## 2021-01-20 NOTE — Assessment & Plan Note (Signed)
Recommend complete cessation of alcohol use. 

## 2021-01-20 NOTE — Assessment & Plan Note (Signed)
Ongoing, followed by nephrology with recent labs and notes reviewed.  Continue this collaboration.  CrCl 31.49 -- maintain current Gabapentin dosing but decrease if drops <30.

## 2021-01-20 NOTE — Assessment & Plan Note (Signed)
Ongoing -- Started medication on 09/03/20.  Current Levothyroxine 25 MCG daily, continue this and adjust as needed.  Recent labs stable.

## 2021-01-20 NOTE — Patient Instructions (Signed)

## 2021-01-20 NOTE — Assessment & Plan Note (Signed)
Noted on exam, recommend gentle skin care and notify provider if wounds present.

## 2021-01-20 NOTE — Assessment & Plan Note (Signed)
I have recommended complete cessation of tobacco use. She continues on Wellbutrin, which has offered benefit.  Refuses CT lung screening.

## 2021-01-20 NOTE — Progress Notes (Signed)
BP 92/64   Pulse 88   Temp 98.3 F (36.8 C) (Oral)   Wt 147 lb 6.4 oz (66.9 kg)   SpO2 95%   BMI 27.85 kg/m    Subjective:    Patient ID: Kristen Mills, female    DOB: 1950-12-19, 70 y.o.   MRN: [id number]  HPI: Kristen Mills is a 70 y.o. female  Chief Complaint  Patient presents with   COPD   Hyperlipidemia   Hypertension   COPD Using Stiolto -- has papers for assistance with her today.  Is using Albuterol, not much.  Is working with CCM team on assistance.  Currently is cutting back, not smoking even a whole carton a day. COPD status: stable Satisfied with current treatment?: yes Oxygen use: no Dyspnea frequency:  Cough frequency:  Rescue inhaler frequency:   Limitation of activity: no Productive cough:  Last Spirometry:  Pneumovax: Up to Date Influenza: Up to Date   HYPERTENSION / HYPERLIPIDEMIA Continues on Telmisartan and Atorvastatin.  A1c in April 2022 was 5.9%, denies any symptoms.  Has cut back on Cocaine -- used one time the past two weeks and only done one shot of bourbon. Satisfied with current treatment? yes Duration of hypertension: chronic BP monitoring frequency: not checking BP range:  BP medication side effects: no Duration of hyperlipidemia: chronic Cholesterol supplements: none Aspirin: no Recent stressors: no Recurrent headaches: no Visual changes: no Palpitations: no Dyspnea: no Chest pain: no Lower extremity edema: no Dizzy/lightheaded: no  The 10-year ASCVD risk score (Arnett DK, et al., 2019) is: 14.8%   Values used to calculate the score:     Age: 70 years     Sex: Female     Is Non-Hispanic African American: No     Diabetic: Yes     Tobacco smoker: Yes     Systolic Blood Pressure: 92 mmHg     Is BP treated: Yes     HDL Cholesterol: 61 mg/dL     Total Cholesterol: 150 mg/dL   CHRONIC KIDNEY DISEASE 01/14/21 saw nephrology with no changes -- is having imaging.  Labs on 01/14/21 -- CRT 1.80, eGFR 30, BUN 34, uric acid  8, PTH 30. CKD status: stable Medications renally dose: yes Previous renal evaluation: no Pneumovax:  Up to Date Influenza Vaccine:  Up to Date  -- current CrCl 31.49  HYPOTHYROIDISM Continues on Levothyroxine 25 MCG with recent TSH stable. Thyroid control status:stable Satisfied with current treatment? yes Medication side effects: no Medication compliance: good compliance Etiology of hypothyroidism:  Recent dose adjustment:no Fatigue: yes Cold intolerance: no Heat intolerance: no Weight gain: no Weight loss: no Constipation: no Diarrhea/loose stools: no Palpitations: no Lower extremity edema: no Anxiety/depressed mood: no   Depression screen [facility] 2/9 01/20/2021 12/18/2020 10/20/2020 07/21/2020 01/21/2020  Decreased Interest '1 1 3 3 ' 0  Down, Depressed, Hopeless 0 0 0 0 0  PHQ - 2 Score '1 1 3 3 ' 0  Altered sleeping 1 0 0 0 1  Tired, decreased energy '2 3 3 3 1  ' Change in appetite 2 1 0 0 0  Feeling bad or failure about yourself  0 0 0 0 0  Trouble concentrating 0 0 0 0 0  Moving slowly or fidgety/restless 0 0 0 0 0  Suicidal thoughts 0 0 0 0 0  PHQ-9 Score '6 5 6 6 2  ' Difficult doing work/chores Not difficult at all Not difficult at all Not difficult at all - Not difficult at all  Some recent data might be hidden     Relevant past medical, surgical, family and social history reviewed and updated as indicated. Interim medical history since our last visit reviewed. Allergies and medications reviewed and updated.  Review of Systems  Constitutional:  Negative for activity change, appetite change, diaphoresis, fatigue and fever.  Respiratory:  Positive for shortness of breath (occasional at baseline) and wheezing (occasional at baseline). Negative for cough and chest tightness.   Cardiovascular:  Negative for chest pain, palpitations and leg swelling.  Gastrointestinal: Negative.   Neurological:  Negative for dizziness, syncope, weakness, light-headedness, numbness and headaches.   Psychiatric/Behavioral:  Negative for decreased concentration, self-injury, sleep disturbance and suicidal ideas. The patient is not nervous/anxious.    Per HPI unless specifically indicated above     Objective:    BP 92/64   Pulse 88   Temp 98.3 F (36.8 C) (Oral)   Wt 147 lb 6.4 oz (66.9 kg)   SpO2 95%   BMI 27.85 kg/m   Wt Readings from Last 3 Encounters:  01/20/21 147 lb 6.4 oz (66.9 kg)  10/20/20 144 lb 9.6 oz (65.6 kg)  07/21/20 147 lb 6.4 oz (66.9 kg)    Physical Exam Vitals and nursing note reviewed.  Constitutional:      General: She is awake. She is not in acute distress.    Appearance: She is well-developed and overweight. She is not ill-appearing.  HENT:     Head: Normocephalic.     Right Ear: Hearing normal.     Left Ear: Hearing normal.  Eyes:     General: Lids are normal.        Right eye: No discharge.        Left eye: No discharge.     Conjunctiva/sclera: Conjunctivae normal.     Pupils: Pupils are equal, round, and reactive to light.  Neck:     Thyroid: No thyromegaly.     Vascular: No carotid bruit.  Cardiovascular:     Rate and Rhythm: Normal rate and regular rhythm.     Heart sounds: Normal heart sounds. No murmur heard.   No gallop.  Pulmonary:     Effort: Pulmonary effort is normal.     Breath sounds: Decreased breath sounds present.     Comments: Clear throughout with diminished breath sounds, no wheezes or rhonchi (baseline). Abdominal:     General: Bowel sounds are normal.     Palpations: Abdomen is soft.  Musculoskeletal:     Cervical back: Normal range of motion and neck supple.     Right lower leg: No edema.     Left lower leg: No edema.  Skin:    General: Skin is warm and dry.  Neurological:     Mental Status: She is alert and oriented to person, place, and time.  Psychiatric:        Attention and Perception: Attention normal.        Mood and Affect: Mood normal.        Speech: Speech normal.        Behavior: Behavior  normal. Behavior is cooperative.        Thought Content: Thought content normal.    Results for orders placed or performed in visit on 63/89/37  Basic metabolic panel  Result Value Ref Range   Glucose 102 (H) 65 - 99 mg/dL   BUN 12 8 - 27 mg/dL   Creatinine, Ser 1.32 (H) 0.57 - 1.00 mg/dL   eGFR 44 (L) >  59 mL/min/1.73   BUN/Creatinine Ratio 9 (L) 12 - 28   Sodium 139 134 - 144 mmol/L   Potassium 5.0 3.5 - 5.2 mmol/L   Chloride 100 96 - 106 mmol/L   CO2 23 20 - 29 mmol/L   Calcium 10.0 8.7 - 10.3 mg/dL  T4, free  Result Value Ref Range   Free T4 1.23 0.82 - 1.77 ng/dL  TSH  Result Value Ref Range   TSH 3.650 0.450 - 4.500 uIU/mL  Thyroid peroxidase antibody  Result Value Ref Range   Thyroperoxidase Ab SerPl-aCnc <8 0 - 34 IU/mL      Assessment & Plan:   Problem List Items Addressed This Visit       Cardiovascular and Mediastinum   Hypertension    Chronic, stable with BP below goal today.  Will reduce Telmisartan to 20 MG daily at this time due to tighter control.  Recommend checking BP at home 3 mornings a week and documenting for provider + DASH diet.  Labs up to date.  Recommend complete cessation of cocaine use and smoking.  Return in 6 months for follow-up.       Relevant Medications   telmisartan (MICARDIS) 20 MG tablet     Respiratory   Simple chronic bronchitis (HCC)    Chronic, ongoing.  Continue Stiolto and Albuterol.  Recommend complete smoking cessation, continue Wellbutrin which has helped with reduction.  Refuses lung CT CA screening at this time, but provided information on this.  She does not wish to pursue pulmonary referral at this time, but may benefit from in future.  Reports "what happens happens and I do not want to know if anything is there or want treatment".  Return in 6 months for follow-up.  X 1 Stiolto sample provided in office today and will alert CCM PharmD to papers arriving + fax.  Spirometry next visit.        Endocrine   Hypothyroid     Ongoing -- Started medication on 09/03/20.  Current Levothyroxine 25 MCG daily, continue this and adjust as needed.  Recent labs stable.        Nervous and Auditory   Nicotine dependence, cigarettes, w unsp disorders    I have recommended complete cessation of tobacco use. She continues on Wellbutrin, which has offered benefit.  Refuses CT lung screening.         Musculoskeletal and Integument   Other nonthrombocytopenic purpura ([place])    Noted on exam, recommend gentle skin care and notify provider if wounds present.        Genitourinary   CKD (chronic kidney disease) stage 3, GFR 30-59 ml/min (HCC) - Primary    Ongoing, followed by nephrology with recent labs and notes reviewed.  Continue this collaboration.  CrCl 31.49 -- maintain current Gabapentin dosing but decrease if drops <30.        Other   Hyperlipemia    Chronic, ongoing.  Continue current medication regimen and adjust as needed.  Lipid panel up to date.      Relevant Medications   telmisartan (MICARDIS) 20 MG tablet   ETOH abuse    Recommend complete cessation of alcohol use.      Cocaine abuse ([place])    Had been cutting back on use with Wellbutrin on board.  Praised for reduction of use and recommend complete cessation.  Suspect this is reason BP is trending downwards, discussed with her, as is using less cocaine and alcohol.  Have discussed at length high  risk for cardiac death with continued cocaine use.      Other Visit Diagnoses     Needs flu shot       Flu vaccine today   Relevant Orders   Flu Vaccine QUAD High Dose(Fluad) (Completed)        Follow up plan: Return in about 6 months (around 07/22/2021) for Annual physical.

## 2021-01-26 ENCOUNTER — Ambulatory Visit
Admission: RE | Admit: 2021-01-26 | Discharge: 2021-01-26 | Disposition: A | Discharge status: Home / Self Care | Payer: PPO | Source: Ambulatory Visit | Attending: Nephrology | Admitting: Nephrology

## 2021-01-26 ENCOUNTER — Other Ambulatory Visit: Payer: Self-pay

## 2021-01-26 DIAGNOSIS — N1832 Chronic kidney disease, stage 3b: Secondary | ICD-10-CM | POA: Insufficient documentation

## 2021-01-26 DIAGNOSIS — N281 Cyst of kidney, acquired: Secondary | ICD-10-CM | POA: Diagnosis not present

## 2021-01-26 DIAGNOSIS — F1410 Cocaine abuse, uncomplicated: Secondary | ICD-10-CM | POA: Diagnosis not present

## 2021-01-26 DIAGNOSIS — F1010 Alcohol abuse, uncomplicated: Secondary | ICD-10-CM | POA: Diagnosis not present

## 2021-01-26 DIAGNOSIS — E785 Hyperlipidemia, unspecified: Secondary | ICD-10-CM | POA: Diagnosis not present

## 2021-01-26 DIAGNOSIS — R8290 Unspecified abnormal findings in urine: Secondary | ICD-10-CM | POA: Insufficient documentation

## 2021-01-26 DIAGNOSIS — I10 Essential (primary) hypertension: Secondary | ICD-10-CM | POA: Diagnosis not present

## 2021-01-26 DIAGNOSIS — N189 Chronic kidney disease, unspecified: Secondary | ICD-10-CM | POA: Diagnosis not present

## 2021-01-27 ENCOUNTER — Telehealth: Payer: Self-pay

## 2021-01-27 NOTE — Chronic Care Management (AMB) (Signed)
I have called [facility] to follow up on patient assistance application on Stiolto from [facility] cares. They have stated that the forms have been received back on 01/21/21. Since it is a renewal application it is on a waiting process.

## 2021-02-06 ENCOUNTER — Other Ambulatory Visit: Payer: Self-pay | Admitting: Nurse Practitioner

## 2021-02-06 NOTE — Telephone Encounter (Signed)
Requested Prescriptions  Pending Prescriptions Disp Refills  . buPROPion (WELLBUTRIN XL) 150 MG 24 hr tablet [Pharmacy Med Name: BUPROPION HCL ER (XL) 150 MG TAB] 60 tablet 6    Sig: TAKE 2 TABLETS (300 MG TOTAL) BY MOUTH ONCE DAILY.     Psychiatry: Antidepressants - bupropion Passed - 02/06/2021  9:06 AM      Passed - Completed PHQ-2 or PHQ-9 in the last 360 days      Passed - Last BP in normal range    BP Readings from Last 1 Encounters:  01/20/21 92/64         Passed - Valid encounter within last 6 months    Recent Outpatient Visits          2 weeks ago Stage 3b chronic kidney disease ([place])   [place] Cannady, Jolene T, NP   3 months ago Stage 3a chronic kidney disease ([place])   [place] Cannady, Jolene T, NP   6 months ago Cocaine abuse ([place])   [place], Henrine Screws T, NP   1 year ago Simple chronic bronchitis ([place])   [place], Henrine Screws T, NP   1 year ago Sebaceous cyst   [facility] Kathrine Haddock, NP      Future Appointments            In 5 months Cannady, Barbaraann Faster, NP [employer] [facility]

## 2021-04-14 ENCOUNTER — Ambulatory Visit: Payer: Self-pay | Admitting: *Deleted

## 2021-04-14 NOTE — Telephone Encounter (Signed)
°  Summary: advice - inhaler   Pt called in stating she has COPD and has been unable to get her inhaler, pt did not have the specific name. Pt stated she has been unable to contact the pharmaceutical store and is not sure what to do.      Requesting new medication Stiolto inhaler to be prescribed due to approval to start medication has been obtained per patient. Patient reports PCP aware and should not need appt again, they have been awaiting approval of medication. Please advise patient if appt needed.       Reason for Disposition  Prescription request for new medicine (not a refill)  Answer Assessment - Initial Assessment Questions 1. DRUG NAME: "What medicine do you need to have refilled?"     Stiolto inhaler, approval given to start medication 2. REFILLS REMAINING: "How many refills are remaining?" (Note: The label on the medicine or pill bottle will show how many refills are remaining. If there are no refills remaining, then a renewal may be needed.)    New medication requested . Approval sent to patient  3. EXPIRATION DATE: "What is the expiration date?" (Note: The label states when the prescription will expire, and thus can no longer be refilled.)     na 4. PRESCRIBING HCP: "Who prescribed it?" Reason: If prescribed by specialist, call should be referred to that group.     na 5. SYMPTOMS: "Do you have any symptoms?"     na 6. PREGNANCY: "Is there any chance that you are pregnant?" "When was your last menstrual period?"     na  Protocols used: Medication Refill and Renewal Call-A-AH

## 2021-04-15 ENCOUNTER — Other Ambulatory Visit: Payer: Self-pay | Admitting: Nurse Practitioner

## 2021-04-15 MED ORDER — TIOTROPIUM BROMIDE-OLODATEROL 2.5-2.5 MCG/ACT IN AERS: 2.0000 | INHALATION_SPRAY | Freq: Every day | RESPIRATORY_TRACT | 4 refills | Status: DC

## 2021-04-15 NOTE — Addendum Note (Signed)
Addended by: Marnee Guarneri T on: 04/15/2021 10:08 AM   Modules accepted: Orders

## 2021-04-15 NOTE — Telephone Encounter (Signed)
Requested Prescriptions  Pending Prescriptions Disp Refills   hydrOXYzine (ATARAX) 10 MG tablet [Pharmacy Med Name: HYDROXYZINE HCL 10 MG TAB] 180 tablet 0    Sig: TAKE 1 TABLET BY MOUTH TWICE A DAY AS NEEDED     Ear, Nose, and Throat:  Antihistamines Passed - 04/15/2021 11:22 AM      Passed - Valid encounter within last 12 months    Recent Outpatient Visits          2 months ago Stage 3b chronic kidney disease ([place])   [place] Cannady, Jolene T, NP   5 months ago Stage 3a chronic kidney disease ([place])   [place] Cannady, Jolene T, NP   8 months ago Cocaine abuse ([place])   [place], Henrine Screws T, NP   1 year ago Simple chronic bronchitis ([place])   [place], Henrine Screws T, NP   1 year ago Sebaceous cyst   [facility] Kathrine Haddock, NP      Future Appointments            In 3 months Cannady, Barbaraann Faster, NP [employer] [facility]

## 2021-04-27 ENCOUNTER — Telehealth: Payer: Self-pay

## 2021-04-27 NOTE — Progress Notes (Signed)
I contacted [facility] about Stiolto medication. The medication only needed to be ordered. Patient is approved on her patient assistance application until 43/32/9518. Her medication will arrive to her with in 5-7 business days from today.   Kristen Mills, [place]

## 2021-05-05 NOTE — Telephone Encounter (Addendum)
Patient came in office to request an update on Stiolto medication.  She has not received it yet.  I advised her of the prior notations from Myriam in her chart.  She is still requesting for Jacob/Myriam to contact her regarding this medication.  Per Jolene, patient is supposed to receive 3 refills not just 1 for this medication.  Patient stated that she has attempted to contact the mail order pharmacy/[facility] but hasn't been able to get ahold of anyone.  Jacob/Myriam, please contact [place] (pharmacy) and patient to advise.

## 2021-05-11 ENCOUNTER — Other Ambulatory Visit: Payer: Self-pay | Admitting: Nurse Practitioner

## 2021-05-12 NOTE — Telephone Encounter (Signed)
Requested Prescriptions  Pending Prescriptions Disp Refills   gabapentin (NEURONTIN) 300 MG capsule [Pharmacy Med Name: GABAPENTIN 300 MG CAP] 270 capsule 0    Sig: TAKE 1 CAPSULE BY MOUTH 3 TIMES A DAY     Neurology: Anticonvulsants - gabapentin Failed - 05/11/2021 12:22 PM      Failed - Cr in normal range and within 360 days    Creatinine  Date Value Ref Range Status  02/08/2012 0.66 0.60 - 1.30 mg/dL Final   Creatinine, Ser  Date Value Ref Range Status  10/20/2020 1.32 (H) 0.57 - 1.00 mg/dL Final         Passed - Completed PHQ-2 or PHQ-9 in the last 360 days      Passed - Valid encounter within last 12 months    Recent Outpatient Visits          3 months ago Stage 3b chronic kidney disease ([place])   [place], Jolene T, NP   6 months ago Stage 3a chronic kidney disease ([place])   [place] Cannady, Jolene T, NP   9 months ago Cocaine abuse ([place])   [place], [place] T, NP   1 year ago Simple chronic bronchitis ([place])   [place], [place] T, NP   1 year ago Sebaceous cyst   [facility] Kathrine Haddock, NP      Future Appointments            In 2 months Cannady, Barbaraann Faster, NP [employer] [facility]

## 2021-06-14 ENCOUNTER — Telehealth: Payer: Self-pay

## 2021-06-14 NOTE — Progress Notes (Signed)
? ? ?  Chronic Care Management ?Pharmacy Assistant  ? ?Name: Kristen Mills  MRN: [id number] DOB: [birth year] ? ?Kristen Mills is an 71 y.o. year old female who presents for his follow-up CCM visit with the clinical pharmacist. ? ?Reason for Encounter: Disease StateGeneral ?  ? ?Recent office visits:  ?None ID ? ?Recent consult visits:  ?None ID ? ?Hospital visits:  ?None in previous 6 months ? ?Medications: ?Outpatient Encounter Medications as of 06/14/2021  ?Medication Sig  ? albuterol (VENTOLIN HFA) 108 (90 Base) MCG/ACT inhaler INHALE 2 PUFFS EVERY FOUR TO SIX HOURS AS NEEDED FOR SHORTNESS OF BREATH.  ? atorvastatin (LIPITOR) 10 MG tablet Take 1 tablet (10 mg total) by mouth daily at 6 PM.  ? buPROPion (WELLBUTRIN XL) 150 MG 24 hr tablet TAKE 2 TABLETS (300 MG TOTAL) BY MOUTH ONCE DAILY.  ? Cholecalciferol 1.25 MG (50000 UT) capsule Take 1 capsule by mouth once a week.  ? gabapentin (NEURONTIN) 300 MG capsule TAKE 1 CAPSULE BY MOUTH 3 TIMES A DAY  ? hydrOXYzine (ATARAX) 10 MG tablet TAKE 1 TABLET BY MOUTH TWICE A DAY AS NEEDED  ? levothyroxine (SYNTHROID) 25 MCG tablet Take 1 tablet (25 mcg total) by mouth daily.  ? telmisartan (MICARDIS) 20 MG tablet Take 1 tablet (20 mg total) by mouth daily.  ? Tiotropium Bromide-Olodaterol 2.5-2.5 MCG/ACT AERS Inhale 2 puffs into the lungs daily.  ? traZODone (DESYREL) 50 MG tablet Take 1 tablet (50 mg total) by mouth at bedtime.  ? ?No facility-administered encounter medications on file as of 06/14/2021.  ? ?Have you had any problems recently with your health?Patient stated that she has not had any new health issues.  ? ?Have you had any problems with your pharmacy?Patient states that she does not have any problems with getting medication from the pharmacy. She states she is not sure if she has refills for telmisartan. I called [facility] and they stated that she had a 90 ds in Oct but has not been by to pick up refill for January. ? ?What issues or side effects  are you having with your medications?Patient stated that she is not having any side effects from medications  ? ?What would you like me to pass along to [facility] Nasuti Potts,CPP for them to help you with? Patient stated that she is doing ok ? ?What can we do to take care of you better? Patient stated that she does not need anything at this time ? ?Care Gaps: ?Colonoscopy-NA-patient declined ?Diabetic Foot Exam-NA ?Mammogram-NA-patient declined ?Ophthalmology-NA ?Dexa Scan - NA-patient declined ?Annual Well Visit - NA ?Micro albumin-09/02/20 ?Hemoglobin A1c- 07/21/20 ? ?Star Rating Drugs: ?Telmisartan 20 mg-last fill 01/20/21 90 ds ?Atorvastatin 10 mg-last fill 04/14/21 90 ds ? ?Ethelene Hal ?Clinical Pharmacist Assistant ?[phone]  ?

## 2021-07-09 ENCOUNTER — Other Ambulatory Visit: Payer: Self-pay | Admitting: Nurse Practitioner

## 2021-07-12 ENCOUNTER — Other Ambulatory Visit: Payer: Self-pay | Admitting: Nurse Practitioner

## 2021-07-12 NOTE — Telephone Encounter (Signed)
Requested Prescriptions  ?Pending Prescriptions Disp Refills  ?? albuterol (VENTOLIN HFA) 108 (90 Base) MCG/ACT inhaler [Pharmacy Med Name: ALBUTEROL SULFATE HFA 108 (90 BASE)] 54 g 5  ?  Sig: INHALE 2 PUFFS EVERY 4 TO 6 HOURS AS NEEDED FOR SHORTNESS OF BREATH  ?  ? Pulmonology:  Beta Agonists 2 Passed - 07/09/2021  3:19 PM  ?  ?  Passed - Last BP in normal range  ?  BP Readings from Last 1 Encounters:  ?01/20/21 92/64  ?   ?  ?  Passed - Last Heart Rate in normal range  ?  Pulse Readings from Last 1 Encounters:  ?01/20/21 88  ?   ?  ?  Passed - Valid encounter within last 12 months  ?  Recent Outpatient Visits   ?      ? 5 months ago Stage 3b chronic kidney disease ([place])  ? [place], [place] T, NP  ? 8 months ago Stage 3a chronic kidney disease ([place])  ? [place], [place] T, NP  ? 11 months ago Cocaine abuse ([place])  ? [place], [place] T, NP  ? 1 year ago Simple chronic bronchitis ([place])  ? [place], Henrine Screws T, NP  ? 1 year ago Sebaceous cyst  ? [facility] Kathrine Haddock, NP  ?  ?  ?Future Appointments   ?        ? In 1 week Cannady, Barbaraann Faster, NP [facility]  ?  ? ?  ?  ?  ? ? ?

## 2021-07-13 NOTE — Telephone Encounter (Signed)
Requested Prescriptions  ?Pending Prescriptions Disp Refills  ?? hydrOXYzine (ATARAX) 10 MG tablet [Pharmacy Med Name: HYDROXYZINE HCL 10 MG TAB] 180 tablet 0  ?  Sig: TAKE 1 TABLET BY MOUTH TWICE A DAY AS NEEDED  ?  ? Ear, Nose, and Throat:  Antihistamines 2 Failed - 07/12/2021  3:15 PM  ?  ?  Failed - Cr in normal range and within 360 days  ?  Creatinine  ?Date Value Ref Range Status  ?02/08/2012 0.66 0.60 - 1.30 mg/dL Final  ? ?Creatinine, Ser  ?Date Value Ref Range Status  ?10/20/2020 1.32 (H) 0.57 - 1.00 mg/dL Final  ?   ?  ?  Passed - Valid encounter within last 12 months  ?  Recent Outpatient Visits   ?      ? 5 months ago Stage 3b chronic kidney disease ([place])  ? [place], [place] T, NP  ? 8 months ago Stage 3a chronic kidney disease ([place])  ? [place], [place] T, NP  ? 11 months ago Cocaine abuse ([place])  ? [place], Faith T, NP  ? 1 year ago Simple chronic bronchitis ([place])  ? [place], Henrine Screws T, NP  ? 1 year ago Sebaceous cyst  ? [facility] Kathrine Haddock, NP  ?  ?  ?Future Appointments   ?        ? In 1 week Cannady, Barbaraann Faster, NP [facility]  ?  ? ?  ?  ?  ? ?

## 2021-07-18 NOTE — Patient Instructions (Incomplete)
Eating Plan for Chronic Obstructive Pulmonary Disease Chronic obstructive pulmonary disease (COPD) causes symptoms such as shortness of breath, coughing, and chest discomfort. These symptoms can make it difficult to eat enough to maintain a healthy weight. Generally, people with COPD should eat a diet that is high in calories, protein, and other nutrients to maintain body weight and to keep the lungs as healthy as possible. Depending on the medicines you take and other health conditions you may have, your health care provider may give you additional recommendations on what to eat or avoid. Talk with your health care provider about your goals for body weight, and work with a dietitian to develop an eating plan that is right for you. What are tips for following this plan? Reading food labels  Avoid foods with more than 300 milligrams (mg) of salt (sodium) per serving. Choose foods that contain at least 4 grams (g) of fiber per serving. Try to eat 20-30 g of fiber each day. Choose foods that are high in calories and protein, such as nuts, beans, yogurt, and cheese. Shopping Do not buy foods labeled as diet, low-calorie, or low-fat. If you are able to eat dairy products: Avoid low-fat or skim milk. Buy dairy products that have at least 2% fat. Buy nutritional supplement drinks. Buy grains and prepared foods labeled as enriched or fortified. Consider buying low-sodium, pre-made foods to conserve energy for eating. Cooking Add dry milk or protein powder to smoothies. Cook with healthy fats, such as olive oil, canola oil, sunflower oil, and grapeseed oil. Add oil, butter, cream cheese, or nut butters to foods to increase fat and calories. To make foods easier to chew and swallow: Cook vegetables, pasta, and rice until soft. Cut or grind meat into very small pieces. Dip breads in liquid. Meal planning  Eat when you feel hungry. Eat 5-6 small meals throughout the day. Drink 6-8 glasses of water  each day. Do not drink liquids with meals. Drink liquids at the end of the meal to avoid feeling full too quickly. Eat a variety of fruits and vegetables every day. Ask for assistance from family or friends with planning and preparing meals as needed. Avoid foods that cause you to feel bloated, such as carbonated drinks, fried foods, beans, broccoli, cabbage, and apples. For older adults, ask your local agency on aging whether you are eligible for meal assistance programs, such as Meals on Wheels. Lifestyle  Do not smoke. Eat slowly. Take small bites and chew food well before swallowing. Do not overeat. This may make it more difficult to breathe after eating. Sit up while eating. If needed, continue to use supplemental oxygen while eating. Rest or relax for 30 minutes before and after eating. Monitor your weight as told by your health care provider. Exercise as told by your health care provider. What foods should I eat? Fruits All fresh, dried, canned, or frozen fruits that do not cause gas. Vegetables All fresh, canned (no salt added), or frozen vegetables that do not cause gas. Grains Whole-grain bread. Enriched whole-grain pasta. Fortified whole-grain cereals. Fortified rice. Quinoa. Meats and other proteins Lean meat. Poultry. Fish. Dried beans. Unsalted nuts. Tofu. Eggs. Nut butters. Dairy Whole or 2% milk. Cheese. Yogurt. Fats and oils Olive oil. Canola oil. Butter. Margarine. Beverages Water. Vegetable juice (no salt added). Decaffeinated coffee. Decaffeinated or herbal tea. Seasonings and condiments Fresh or dried herbs. Low-salt or salt-free seasonings. Low-sodium soy sauce. The items listed above may not be a complete list of foods   and beverages you can eat. Contact a dietitian for more information. What foods should I avoid? Fruits Fruits that cause gas, such as apples or melon. Vegetables Vegetables that cause gas, such as broccoli, Brussels sprouts, cabbage,  cauliflower, and onions. Canned vegetables with added salt. Meats and other proteins Fried meat. Salt-cured meat. Processed meat. Dairy Fat-free or low-fat milk, yogurt, or cheese. Processed cheese. Beverages Carbonated drinks. Caffeinated drinks, such as coffee, tea, and soft drinks. Juice. Alcohol. Vegetable juice with added salt. Seasonings and condiments Salt. Seasoning mixes with salt. Soy sauce. Pickles. Other foods Clear soup or broth. Fried foods. Prepared frozen meals. The items listed above may not be a complete list of foods and beverages you should avoid. Contact a dietitian for more information. Summary COPD symptoms can make it difficult to eat enough to maintain a healthy weight. A COPD eating plan can help you maintain your body weight and keep your lungs as healthy as possible. Eat a diet that is high in calories, protein, and other nutrients. Read labels to make sure that you are getting the right nutrients. Cook foods to make them easier to chew and swallow. Eat 5-6 small meals throughout the day, and avoid foods that cause gas or make you feel bloated. This information is not intended to replace advice given to you by your health care provider. Make sure you discuss any questions you have with your health care provider. Document Revised: 01/21/2020 Document Reviewed: 01/21/2020 Elsevier Patient Education  2023 [employer]  

## 2021-07-23 ENCOUNTER — Encounter: Payer: PPO | Admitting: Nurse Practitioner

## 2021-07-23 DIAGNOSIS — D692 Other nonthrombocytopenic purpura: Secondary | ICD-10-CM

## 2021-07-23 DIAGNOSIS — E782 Mixed hyperlipidemia: Secondary | ICD-10-CM

## 2021-07-23 DIAGNOSIS — N1832 Chronic kidney disease, stage 3b: Secondary | ICD-10-CM

## 2021-07-23 DIAGNOSIS — Z0000 Encounter for general adult medical examination without abnormal findings: Secondary | ICD-10-CM

## 2021-07-23 DIAGNOSIS — E559 Vitamin D deficiency, unspecified: Secondary | ICD-10-CM

## 2021-07-23 DIAGNOSIS — F17219 Nicotine dependence, cigarettes, with unspecified nicotine-induced disorders: Secondary | ICD-10-CM

## 2021-07-23 DIAGNOSIS — R7309 Other abnormal glucose: Secondary | ICD-10-CM

## 2021-07-23 DIAGNOSIS — E039 Hypothyroidism, unspecified: Secondary | ICD-10-CM

## 2021-07-23 DIAGNOSIS — F5104 Psychophysiologic insomnia: Secondary | ICD-10-CM

## 2021-07-23 DIAGNOSIS — J410 Simple chronic bronchitis: Secondary | ICD-10-CM

## 2021-07-23 DIAGNOSIS — I10 Essential (primary) hypertension: Secondary | ICD-10-CM

## 2021-07-23 DIAGNOSIS — F1010 Alcohol abuse, uncomplicated: Secondary | ICD-10-CM

## 2021-07-23 DIAGNOSIS — R808 Other proteinuria: Secondary | ICD-10-CM

## 2021-07-23 DIAGNOSIS — F1410 Cocaine abuse, uncomplicated: Secondary | ICD-10-CM

## 2021-07-23 DIAGNOSIS — E538 Deficiency of other specified B group vitamins: Secondary | ICD-10-CM

## 2021-07-23 DIAGNOSIS — B182 Chronic viral hepatitis C: Secondary | ICD-10-CM

## 2021-07-23 DIAGNOSIS — F331 Major depressive disorder, recurrent, moderate: Secondary | ICD-10-CM

## 2021-07-31 NOTE — Patient Instructions (Addendum)
Please call to schedule your mammogram and/or bone density: ?[facility]  ?Address: [address], [place], [place] [address] ?Phone: [phone]  ? ?DASH Eating Plan ?DASH stands for Dietary Approaches to Stop Hypertension. The DASH eating plan is a healthy eating plan that has been shown to: ?Reduce high blood pressure (hypertension). ?Reduce your risk for type 2 diabetes, heart disease, and stroke. ?Help with weight loss. ?What are tips for following this plan? ?Reading food labels ?Check food labels for the amount of salt (sodium) per serving. Choose foods with less than 5 percent of the Daily Value of sodium. Generally, foods with less than 300 milligrams (mg) of sodium per serving fit into this eating plan. ?To find whole grains, look for the word "whole" as the first word in the ingredient list. ?Shopping ?Buy products labeled as "low-sodium" or "no salt added." ?Buy fresh foods. Avoid canned foods and pre-made or frozen meals. ?Cooking ?Avoid adding salt when cooking. Use salt-free seasonings or herbs instead of table salt or sea salt. Check with your health care provider or pharmacist before using salt substitutes. ?Do not fry foods. Cook foods using healthy methods such as baking, boiling, grilling, roasting, and broiling instead. ?Cook with heart-healthy oils, such as olive, canola, avocado, soybean, or sunflower oil. ?Meal planning ? ?Eat a balanced diet that includes: ?4 or more servings of fruits and 4 or more servings of vegetables each day. Try to fill one-half of your plate with fruits and vegetables. ?6-8 servings of whole grains each day. ?Less than 6 oz (170 g) of lean meat, poultry, or fish each day. A 3-oz (85-g) serving of meat is about the same size as a deck of cards. One egg equals 1 oz (28 g). ?2-3 servings of low-fat dairy each day. One serving is 1 cup (237 mL). ?1 serving of nuts, seeds, or beans 5 times each week. ?2-3 servings of  heart-healthy fats. Healthy fats called omega-3 fatty acids are found in foods such as walnuts, flaxseeds, fortified milks, and eggs. These fats are also found in cold-water fish, such as sardines, salmon, and mackerel. ?Limit how much you eat of: ?Canned or prepackaged foods. ?Food that is high in trans fat, such as some fried foods. ?Food that is high in saturated fat, such as fatty meat. ?Desserts and other sweets, sugary drinks, and other foods with added sugar. ?Full-fat dairy products. ?Do not salt foods before eating. ?Do not eat more than 4 egg yolks a week. ?Try to eat at least 2 vegetarian meals a week. ?Eat more home-cooked food and less restaurant, buffet, and fast food. ?Lifestyle ?When eating at a restaurant, ask that your food be prepared with less salt or no salt, if possible. ?If you drink alcohol: ?Limit how much you use to: ?0-1 drink a day for women who are not pregnant. ?0-2 drinks a day for men. ?Be aware of how much alcohol is in your drink. In the [place] one drink equals one 12 oz bottle of beer (355 mL), one 5 oz glass of wine (148 mL), or one 1? oz glass of hard liquor (44 mL). ?General information ?Avoid eating more than 2,300 mg of salt a day. If you have hypertension, you may need to reduce your sodium intake to 1,500 mg a day. ?Work with your health care provider to maintain a healthy body weight or to lose weight. Ask what an ideal weight is for you. ?Get at least 30 minutes of  exercise that causes your heart to beat faster (aerobic exercise) most days of the week. Activities may include walking, swimming, or biking. ?Work with your health care provider or dietitian to adjust your eating plan to your individual calorie needs. ?What foods should I eat? ?Fruits ?All fresh, dried, or frozen fruit. Canned fruit in natural juice (without added sugar). ?Vegetables ?Fresh or frozen vegetables (raw, steamed, roasted, or grilled). Low-sodium or reduced-sodium tomato and vegetable juice.  Low-sodium or reduced-sodium tomato sauce and tomato paste. Low-sodium or reduced-sodium canned vegetables. ?Grains ?Whole-grain or whole-wheat bread. Whole-grain or whole-wheat pasta. Brown rice. [place]ena Morrow. Bulgur. Whole-grain and low-sodium cereals. Pita bread. Low-fat, low-sodium crackers. Whole-wheat flour tortillas. ?Meats and other proteins ?Skinless chicken or [place]. Ground chicken or [place]. Pork with fat trimmed off. Fish and seafood. Egg whites. Dried beans, peas, or lentils. Unsalted nuts, nut butters, and seeds. Unsalted canned beans. Lean cuts of beef with fat trimmed off. Low-sodium, lean precooked or cured meat, such as sausages or meat loaves. ?Dairy ?Low-fat (1%) or fat-free (skim) milk. Reduced-fat, low-fat, or fat-free cheeses. Nonfat, low-sodium ricotta or cottage cheese. Low-fat or nonfat yogurt. Low-fat, low-sodium cheese. ?Fats and oils ?Soft margarine without trans fats. Vegetable oil. Reduced-fat, low-fat, or light mayonnaise and salad dressings (reduced-sodium). Canola, safflower, olive, avocado, soybean, and sunflower oils. Avocado. ?Seasonings and condiments ?Herbs. Spices. Seasoning mixes without salt. ?Other foods ?Unsalted popcorn and pretzels. Fat-free sweets. ?The items listed above may not be a complete list of foods and beverages you can eat. Contact a dietitian for more information. ?What foods should I avoid? ?Fruits ?Canned fruit in a light or heavy syrup. Fried fruit. Fruit in cream or butter sauce. ?Vegetables ?Creamed or fried vegetables. Vegetables in a cheese sauce. Regular canned vegetables (not low-sodium or reduced-sodium). Regular canned tomato sauce and paste (not low-sodium or reduced-sodium). Regular tomato and vegetable juice (not low-sodium or reduced-sodium). Angie Fava. Olives. ?Grains ?Baked goods made with fat, such as croissants, muffins, or some breads. Dry pasta or rice meal packs. ?Meats and other proteins ?Fatty cuts of meat. Ribs. Fried meat. Berniece Salines.  Bologna, salami, and other precooked or cured meats, such as sausages or meat loaves. Fat from the back of a pig (fatback). Bratwurst. Salted nuts and seeds. Canned beans with added salt. Canned or smoked fish. Whole eggs or egg yolks. Chicken or [place] with skin. ?Dairy ?Whole or 2% milk, cream, and half-and-half. Whole or full-fat cream cheese. Whole-fat or sweetened yogurt. Full-fat cheese. Nondairy creamers. Whipped toppings. Processed cheese and cheese spreads. ?Fats and oils ?Butter. Stick margarine. Lard. Shortening. Ghee. Bacon fat. Tropical oils, such as coconut, palm kernel, or palm oil. ?Seasonings and condiments ?Onion salt, garlic salt, seasoned salt, table salt, and sea salt. Worcestershire sauce. Tartar sauce. Barbecue sauce. Teriyaki sauce. Soy sauce, including reduced-sodium. Steak sauce. Canned and packaged gravies. Fish sauce. Oyster sauce. Cocktail sauce. Store-bought horseradish. Ketchup. Mustard. Meat flavorings and tenderizers. Bouillon cubes. Hot sauces. Pre-made or packaged marinades. Pre-made or packaged taco seasonings. Relishes. Regular salad dressings. ?Other foods ?Salted popcorn and pretzels. ?The items listed above may not be a complete list of foods and beverages you should avoid. Contact a dietitian for more information. ?Where to find more information ?National Heart, Lung, and Blood Institute: https://wilson-eaton.com/ ?American Heart Association: www.heart.org ?Academy of Nutrition and Dietetics: www.eatright.org ?[place]: www.kidney.org ?Summary ?The DASH eating plan is a healthy eating plan that has been shown to reduce high blood pressure (hypertension). It may also reduce your risk for type 2  diabetes, heart disease, and stroke. ?When on the DASH eating plan, aim to eat more fresh fruits and vegetables, whole grains, lean proteins, low-fat dairy, and heart-healthy fats. ?With the DASH eating plan, you should limit salt (sodium) intake to 2,300 mg a day. If you have  hypertension, you may need to reduce your sodium intake to 1,500 mg a day. ?Work with your health care provider or dietitian to adjust your eating plan to your individual calorie needs. ?This information is not intend

## 2021-08-02 ENCOUNTER — Ambulatory Visit (INDEPENDENT_AMBULATORY_CARE_PROVIDER_SITE_OTHER): Payer: PPO | Admitting: Nurse Practitioner

## 2021-08-02 ENCOUNTER — Encounter: Payer: Self-pay | Admitting: Nurse Practitioner

## 2021-08-02 VITALS — BP 120/78 | HR 88 | Temp 97.7°F | Ht 60.75 in | Wt 139.8 lb

## 2021-08-02 DIAGNOSIS — F331 Major depressive disorder, recurrent, moderate: Secondary | ICD-10-CM | POA: Diagnosis not present

## 2021-08-02 DIAGNOSIS — R801 Persistent proteinuria, unspecified: Secondary | ICD-10-CM | POA: Diagnosis not present

## 2021-08-02 DIAGNOSIS — Z0000 Encounter for general adult medical examination without abnormal findings: Secondary | ICD-10-CM | POA: Diagnosis not present

## 2021-08-02 DIAGNOSIS — J410 Simple chronic bronchitis: Secondary | ICD-10-CM

## 2021-08-02 DIAGNOSIS — I10 Essential (primary) hypertension: Secondary | ICD-10-CM | POA: Diagnosis not present

## 2021-08-02 DIAGNOSIS — B182 Chronic viral hepatitis C: Secondary | ICD-10-CM | POA: Diagnosis not present

## 2021-08-02 DIAGNOSIS — F1010 Alcohol abuse, uncomplicated: Secondary | ICD-10-CM

## 2021-08-02 DIAGNOSIS — E559 Vitamin D deficiency, unspecified: Secondary | ICD-10-CM | POA: Diagnosis not present

## 2021-08-02 DIAGNOSIS — E039 Hypothyroidism, unspecified: Secondary | ICD-10-CM | POA: Diagnosis not present

## 2021-08-02 DIAGNOSIS — E782 Mixed hyperlipidemia: Secondary | ICD-10-CM

## 2021-08-02 DIAGNOSIS — R7309 Other abnormal glucose: Secondary | ICD-10-CM | POA: Diagnosis not present

## 2021-08-02 DIAGNOSIS — F1410 Cocaine abuse, uncomplicated: Secondary | ICD-10-CM

## 2021-08-02 DIAGNOSIS — N1832 Chronic kidney disease, stage 3b: Secondary | ICD-10-CM

## 2021-08-02 DIAGNOSIS — F5104 Psychophysiologic insomnia: Secondary | ICD-10-CM

## 2021-08-02 DIAGNOSIS — D692 Other nonthrombocytopenic purpura: Secondary | ICD-10-CM

## 2021-08-02 DIAGNOSIS — M48062 Spinal stenosis, lumbar region with neurogenic claudication: Secondary | ICD-10-CM

## 2021-08-02 DIAGNOSIS — F17219 Nicotine dependence, cigarettes, with unspecified nicotine-induced disorders: Secondary | ICD-10-CM

## 2021-08-02 LAB — BAYER DCA HB A1C WAIVED: HB A1C (BAYER DCA - WAIVED): 5.6 % (ref 4.8–5.6)

## 2021-08-02 LAB — MICROALBUMIN, URINE WAIVED
Creatinine, Urine Waived: 200 mg/dL (ref 10–300)
Microalb, Ur Waived: 30 mg/L — ABNORMAL HIGH (ref 0–19)
Microalb/Creat Ratio: <30 mg/g (ref <30)

## 2021-08-02 MED ORDER — BUPROPION HCL ER (XL) 150 MG PO TB24: ORAL_TABLET | ORAL | 6 refills | Status: DC

## 2021-08-02 MED ORDER — PREDNISONE 20 MG PO TABS: 40.0000 mg | ORAL_TABLET | Freq: Every day | ORAL | 0 refills | Status: AC

## 2021-08-02 MED ORDER — AMOXICILLIN-POT CLAVULANATE 875-125 MG PO TABS: 1.0000 | ORAL_TABLET | Freq: Two times a day (BID) | ORAL | 0 refills | Status: AC

## 2021-08-02 MED ORDER — TRAZODONE HCL 50 MG PO TABS
50.0000 mg | ORAL_TABLET | Freq: Every day | ORAL | 4 refills | Status: DC
Start: 2021-08-02 — End: 2022-07-20

## 2021-08-02 MED ORDER — ATORVASTATIN CALCIUM 10 MG PO TABS
10.0000 mg | ORAL_TABLET | Freq: Every day | ORAL | 4 refills | Status: DC
Start: 2021-08-02 — End: 2021-08-08

## 2021-08-02 MED ORDER — LEVOTHYROXINE SODIUM 25 MCG PO TABS: 25.0000 ug | ORAL_TABLET | Freq: Every day | ORAL | 4 refills | Status: DC

## 2021-08-02 MED ORDER — TELMISARTAN 20 MG PO TABS: 20.0000 mg | ORAL_TABLET | Freq: Every day | ORAL | 4 refills | Status: DC

## 2021-08-02 MED ORDER — CHOLECALCIFEROL 1.25 MG (50000 UT) PO CAPS: 50000.0000 [IU] | ORAL_CAPSULE | ORAL | 4 refills | Status: DC

## 2021-08-02 MED ORDER — GABAPENTIN 300 MG PO CAPS: 300.0000 mg | ORAL_CAPSULE | Freq: Three times a day (TID) | ORAL | 4 refills | Status: DC

## 2021-08-02 NOTE — Assessment & Plan Note (Signed)
Had been cutting back on use with Wellbutrin on board.  Praised for reduction of use and recommend complete cessation.  Suspect this is reason BP is trending downwards, discussed with her, as is using less cocaine and alcohol.  Have discussed at length high risk for cardiac death with continued cocaine use. ?

## 2021-08-02 NOTE — Assessment & Plan Note (Signed)
I have recommended complete cessation of tobacco use. She continues on Wellbutrin, which has offered benefit.  Refuses CT lung screening. ? ?

## 2021-08-02 NOTE — Assessment & Plan Note (Signed)
Chronic, ongoing.  Continue Stiolto and Albuterol.  Recommend complete smoking cessation, continue Wellbutrin which has helped with reduction.  Refuses lung CT CA screening at this time, but provided information on this.  She does not wish to pursue pulmonary referral at this time, but may benefit from in future.  Reports "what happens happens and I do not want to know if anything is there or want treatment".  Continue to collaborate with CCM team for assistance.  Spirometry next visit.  Return in 6 months for follow-up.  ?

## 2021-08-02 NOTE — Assessment & Plan Note (Signed)
Ongoing, followed by nephrology with recent labs and notes reviewed.  Continue this collaboration.  CrCl 31.49 -- maintain current Gabapentin dosing but decrease if drops <30. ?

## 2021-08-02 NOTE — Assessment & Plan Note (Signed)
Noted on past labs, today is 30 on check.  Continue Telmisartan for kidney protection. ?

## 2021-08-02 NOTE — Assessment & Plan Note (Signed)
Chronic, ongoing.  Continue current medication regimen and adjust as needed. Lipid panel today. 

## 2021-08-02 NOTE — Assessment & Plan Note (Signed)
Continue supplement and recheck Vit D level today.  May consider switch from weekly to daily dosing if improved. ?

## 2021-08-02 NOTE — Assessment & Plan Note (Signed)
Ongoing -- Started medication on 09/03/20.  Current Levothyroxine 25 MCG daily, continue this and adjust as needed.  Labs today. ?

## 2021-08-02 NOTE — Assessment & Plan Note (Signed)
Chronic, stable with BP below goal today.  Will continue Telmisartan 20 MG daily at this time due to tighter control with higher doses.  This is offering benefit to proteinuria.  Recommend checking BP at home 3 mornings a week and documenting for provider + DASH diet.  LABS: CBC, CMP, TSH, urine ALB (30 on check today).  Recommend complete cessation of cocaine use and smoking.  Return in 6 months for follow-up. ? ?

## 2021-08-02 NOTE — Assessment & Plan Note (Signed)
Chronic, improved on Trazodone.  Continue current medication regimen, refills sent.   ?

## 2021-08-02 NOTE — Assessment & Plan Note (Signed)
Chronic, ongoing.  Will continue Gabapentin for pain control.  Refills sent in. ?

## 2021-08-02 NOTE — Assessment & Plan Note (Signed)
A1c 5.6% today, has lost some weight since last visit.  Recommend continued focus on diet and regular activity. ?

## 2021-08-02 NOTE — Progress Notes (Signed)
? ?BP 120/78   Pulse 88 Comment: apical  Temp 97.7 ?F (36.5 ?C) (Oral)   Ht 5' 0.75" (1.543 m)   Wt 139 lb 12.8 oz (63.4 kg)   SpO2 93%   BMI 26.63 kg/m?   ? ?Subjective:  ? ? Patient ID: Kristen Mills, female    DOB: [birth year], 71 y.o.   MRN: [id number] ? ?HPI: ?Kristen Mills is a 71 y.o. female presenting on 08/02/2021 for comprehensive medical examination. Current medical complaints include:none ? ?She currently lives with: lives by self ?Menopausal Symptoms: no  ? ?COPD ?Continues on Stiolto via assistance program and Albuterol as needed.  Has been then sick for about one week with sinus congestion and cough.  Covid negative at home on testing. ?COPD status: stable ?Satisfied with current treatment?: yes ?Oxygen use: no ?Dyspnea frequency: yes, at present due to illness ?Cough frequency: a little bit more with illness ?Rescue inhaler frequency:   ?Limitation of activity: no ?Productive cough: yes ?Last Spirometry: 11/12/18 ?Pneumovax: Up to Date ?Influenza: Up to Date ? ? ?HYPERTENSION / HYPERLIPIDEMIA ?Continues on Telmisartan 20 MG daily (is breaking her 40 MG in 1/2 at this time) and Atorvastatin 10 MG daily.  A1c in April 2022 5.9%, denies any symptoms. ? ?Continues to smoke, but is smoking less then 1 PPD.  Has smoked for many years -- continues Wellbutrin.  Does not want lung cancer screening. ?Satisfied with current treatment? yes ?Duration of hypertension: chronic ?BP monitoring frequency: not checking ?BP range:  ?BP medication side effects: no ?Duration of hyperlipidemia: chronic ?Cholesterol supplements: none ?Aspirin: no ?Recent stressors: no ?Recurrent headaches: no ?Visual changes: no ?Palpitations: no ?Dyspnea: no ?Chest pain: no ?Lower extremity edema: no ?Dizzy/lightheaded: no  ?The 10-year ASCVD risk score (Arnett DK, et al., 2019) is: 26.2% ?  Values used to calculate the score: ?    Age: 28 years ?    Sex: Female ?    Is Non-Hispanic African American: No ?    Diabetic: Yes ?    Tobacco  smoker: Yes ?    Systolic Blood Pressure: 409 mmHg ?    Is BP treated: Yes ?    HDL Cholesterol: 61 mg/dL ?    Total Cholesterol: 150 mg/dL ? ?CHRONIC KIDNEY DISEASE ?Recent CRT 1.32 and GFR 44, is taking Telmisartan. ?CKD status: stable ?Medications renally dose: yes ?Previous renal evaluation: no ?Pneumovax:  Up to Date ?Influenza Vaccine:  Up to Date  ? ?HYPOTHYROIDISM ?Continues on Levothyroxine 25 MCG.  Taking Vitamin D wee for history of low levels.  ?Thyroid control status:stable ?Satisfied with current treatment? yes ?Medication side effects: no ?Medication compliance: good compliance ?Etiology of hypothyroidism:  ?Recent dose adjustment:no ?Fatigue: no ?Cold intolerance: no ?Heat intolerance: no ?Weight gain: no ?Weight loss: no ?Constipation: no ?Diarrhea/loose stools: no ?Palpitations: no ?Lower extremity edema: no ?Anxiety/depressed mood: no  ?  ?DEPRESSION ?Taking Wellbutrin for mood and Trazodone for sleep + Gabapentin which she uses for sleep and chronic back pain.  Continues to use cocaine, last used Friday -- is cutting back on this.  States she uses it due to making her feel good, but had been using less due to Wellbutrin, although recently increased stressors.  Refuses rehab, therapy, or psychiatry referral.   ?Mood status: exacerbated ?Satisfied with current treatment?: yes ?Symptom severity: moderate  ?Duration of current treatment : chronic ?Side effects: no ?Medication compliance: good compliance ?Psychotherapy/counseling: none ?Depressed mood: yes ?Anxious mood: yes ?Anhedonia: no ?Significant weight loss or gain:  no ?Insomnia: none ?Fatigue: no ?Feelings of worthlessness or guilt: no ?Impaired concentration/indecisiveness: yes ?Suicidal ideations: no ?Hopelessness: no ?Crying spells: no ? ?  08/02/2021  ?  1:47 PM 01/20/2021  ?  4:29 PM 12/18/2020  ? 11:28 AM 10/20/2020  ?  2:03 PM 07/21/2020  ?  3:03 PM  ?Depression screen PHQ 2/9  ?Decreased Interest '1 1 1 3 3  '$ ?Down, Depressed, Hopeless 0 0 0  0 0  ?PHQ - 2 Score '1 1 1 3 3  '$ ?Altered sleeping 0 1 0 0 0  ?Tired, decreased energy '1 2 3 3 3  '$ ?Change in appetite 0 2 1 0 0  ?Feeling bad or failure about yourself  0 0 0 0 0  ?Trouble concentrating 0 0 0 0 0  ?Moving slowly or fidgety/restless 0 0 0 0 0  ?Suicidal thoughts 0 0 0 0 0  ?PHQ-9 Score '2 6 5 6 6  '$ ?Difficult doing work/chores Not difficult at all Not difficult at all Not difficult at all Not difficult at all   ? ? ?The patient does not have a history of falls. I did not complete a risk assessment for falls. A plan of care for falls was not documented. ? ? ?Past Medical History:  ?Past Medical History:  ?Diagnosis Date  ? Asthma   ? Cancer [facility])   ? Uterine Cancer  ? COPD (chronic obstructive pulmonary disease) ([place])   ? Depression   ? Diabetes mellitus without complication ([place])   ? Hyperlipidemia   ? Hypertension   ? Insomnia   ? Polysubstance abuse ([place])   ? Spinal stenosis   ? Thyroid condition   ? ? ?Surgical History:  ?Past Surgical History:  ?Procedure Laterality Date  ? ABDOMINAL HYSTERECTOMY    ? ? ?Medications:  ?Current Outpatient Medications on File Prior to Visit  ?Medication Sig  ? albuterol (VENTOLIN HFA) 108 (90 Base) MCG/ACT inhaler INHALE 2 PUFFS EVERY 4 TO 6 HOURS AS NEEDED FOR SHORTNESS OF BREATH  ? hydrOXYzine (ATARAX) 10 MG tablet TAKE 1 TABLET BY MOUTH TWICE A DAY AS NEEDED  ? Tiotropium Bromide-Olodaterol 2.5-2.5 MCG/ACT AERS Inhale 2 puffs into the lungs daily.  ? ?No current facility-administered medications on file prior to visit.  ? ? ?Allergies:  ?No Known Allergies ? ?Social History:  ?Social History  ? ?Socioeconomic History  ? Marital status: Single  ?  Spouse name: Not on file  ? Number of children: Not on file  ? Years of education: Not on file  ? Highest education level: Not on file  ?Occupational History  ? Not on file  ?Tobacco Use  ? Smoking status: Every Day  ?  Packs/day: 1.00  ?  Years: 45.00  ?  Pack years: 45.00  ?  Types: Cigarettes  ? Smokeless tobacco: Never   ?Vaping Use  ? Vaping Use: Former  ?Substance and Sexual Activity  ? Alcohol use: Yes  ?  Alcohol/week: 3.0 standard drinks  ?  Types: 3 Cans of beer per week  ?  Comment: Few beers daily.  ? Drug use: Yes  ?  Types: "Crack" cocaine, Marijuana, Cocaine  ?  Comment: currently cocaine  ? Sexual activity: Not Currently  ?Other Topics Concern  ? Not on file  ?Social History Narrative  ? Not on file  ? ?Social Determinants of Health  ? ?Financial Resource Strain: Medium Risk  ? Difficulty of Paying Living Expenses: Somewhat hard  ?Food Insecurity: Food Insecurity Present  ?  Worried About Charity fundraiser in the Last Year: Sometimes true  ? Ran Out of Food in the Last Year: Sometimes true  ?Transportation Needs: No Transportation Needs  ? Lack of Transportation (Medical): No  ? Lack of Transportation (Non-Medical): No  ?Physical Activity: Inactive  ? Days of Exercise per Week: 0 days  ? Minutes of Exercise per Session: 0 min  ?Stress: No Stress Concern Present  ? Feeling of Stress : Only a little  ?Social Connections: Socially Isolated  ? Frequency of Communication with Friends and Family: More than three times a week  ? Frequency of Social Gatherings with Friends and Family: More than three times a week  ? Attends Religious Services: Never  ? Active Member of Clubs or Organizations: No  ? Attends Archivist Meetings: Never  ? Marital Status: Divorced  ?Intimate Partner Violence: Not At Risk  ? Fear of Current or Ex-Partner: No  ? Emotionally Abused: No  ? Physically Abused: No  ? Sexually Abused: No  ? ?Social History  ? ?Tobacco Use  ?Smoking Status Every Day  ? Packs/day: 1.00  ? Years: 45.00  ? Pack years: 45.00  ? Types: Cigarettes  ?Smokeless Tobacco Never  ? ?Social History  ? ?Substance and Sexual Activity  ?Alcohol Use Yes  ? Alcohol/week: 3.0 standard drinks  ? Types: 3 Cans of beer per week  ? Comment: Few beers daily.  ? ? ?Family History:  ?Family History  ?Problem Relation Age of Onset  ?  Cancer Mother   ?     Lung  ? Cancer Father   ?     Lung  ? Heart disease Sister   ? Heart attack Sister   ? Cancer Brother   ?     Lung  ? Bipolar disorder Son   ? ? ?Past medical history, surgical history, med

## 2021-08-02 NOTE — Assessment & Plan Note (Signed)
Noted on exam, recommend gentle skin care and notify provider if wounds present. ?

## 2021-08-02 NOTE — Assessment & Plan Note (Signed)
Repeat Hep C antibody at next visit and consider GI referral for further work-up, at this time refuses both lab for this or referral.  Recommended no ETOH or cocaine use. ?

## 2021-08-02 NOTE — Assessment & Plan Note (Signed)
Ongoing, but improved with Wellbutrin on board.  Denies SI/HI.  She has reduced cocaine and alcohol use + smoking.  Continue current medication regimen and adjust as needed.  Refills sent in.  To alert provider or go immediately to ER if SI/HI presents.  Return in 6 months. ?

## 2021-08-02 NOTE — Assessment & Plan Note (Signed)
Recommend complete cessation of alcohol use. 

## 2021-08-08 ENCOUNTER — Other Ambulatory Visit: Payer: Self-pay | Admitting: Nurse Practitioner

## 2021-08-08 DIAGNOSIS — E039 Hypothyroidism, unspecified: Secondary | ICD-10-CM

## 2021-08-08 DIAGNOSIS — E782 Mixed hyperlipidemia: Secondary | ICD-10-CM

## 2021-08-08 MED ORDER — ATORVASTATIN CALCIUM 20 MG PO TABS: 20.0000 mg | ORAL_TABLET | Freq: Every day | ORAL | 4 refills | Status: DC

## 2021-08-08 MED ORDER — CHOLECALCIFEROL 1.25 MG (50000 UT) PO CAPS: 50000.0000 [IU] | ORAL_CAPSULE | ORAL | 4 refills | Status: DC

## 2021-08-08 MED ORDER — LEVOTHYROXINE SODIUM 50 MCG PO TABS: 50.0000 ug | ORAL_TABLET | Freq: Every day | ORAL | 4 refills | Status: DC

## 2021-08-08 NOTE — Progress Notes (Signed)
Good morning crew, please let Kristen Mills know all labs have returned with exception of one of her lung health labs I am still waiting on and will alert her upon return: ?- CBC shows no anemia or infection present. ?- Kidney function continues to show kidney disease present with no worsening -- continue your visits with the kidney doctor, if you do not have upcoming visit with them please ensure to schedule.  Liver function is normal. ?- Cholesterol levels continue to show some elevation above goal, I am going to increase your Atorvastatin to 20 MG -- you can complete the 10 MG tablets by taking 2 of these a day. ?- Vitamin D is a little low, please ensure you are taking your weekly supplement. ?- Thyroid labs show a little elevation, I am going to increase your Levothyroxine to 50 MG daily.  Will send this in.  You can complete your 25 MCG tablets by taking 2 of them a day and then start 50 MCG tablet.  I would like you to schedule lab only visit for 6 weeks to recheck all of this.  My staff will help and schedule lab only visit for you.  Any questions? ?Keep being amazing!!  Thank you for allowing me to participate in your care.  I appreciate you. ?Kindest regards, ?Laine Giovanetti ?

## 2021-08-14 LAB — CBC WITH DIFFERENTIAL/PLATELET
Basophils Absolute: 0.1 10*3/uL (ref 0.0–0.2)
Basos: 2 %
EOS (ABSOLUTE): 0.4 10*3/uL (ref 0.0–0.4)
Eos: 5 %
Hematocrit: 43.2 % (ref 34.0–46.6)
Hemoglobin: 14.2 g/dL (ref 11.1–15.9)
Immature Grans (Abs): 0 10*3/uL (ref 0.0–0.1)
Immature Granulocytes: 0 %
Lymphocytes Absolute: 3.8 10*3/uL — ABNORMAL HIGH (ref 0.7–3.1)
Lymphs: 46 %
MCH: 29.2 pg (ref 26.6–33.0)
MCHC: 32.9 g/dL (ref 31.5–35.7)
MCV: 89 fL (ref 79–97)
Monocytes Absolute: 0.9 10*3/uL (ref 0.1–0.9)
Monocytes: 11 %
Neutrophils Absolute: 2.9 10*3/uL (ref 1.4–7.0)
Neutrophils: 36 %
Platelets: 326 10*3/uL (ref 150–450)
RBC: 4.87 x10E6/uL (ref 3.77–5.28)
RDW: 12.8 % (ref 11.7–15.4)
WBC: 8.1 10*3/uL (ref 3.4–10.8)

## 2021-08-14 LAB — COMPREHENSIVE METABOLIC PANEL
ALT: 13 IU/L (ref 0–32)
AST: 20 IU/L (ref 0–40)
Albumin/Globulin Ratio: 1.7 (ref 1.2–2.2)
Albumin: 4.3 g/dL (ref 3.8–4.8)
Alkaline Phosphatase: 102 IU/L (ref 44–121)
BUN/Creatinine Ratio: 11 — ABNORMAL LOW (ref 12–28)
BUN: 17 mg/dL (ref 8–27)
Bilirubin Total: 0.2 mg/dL (ref 0.0–1.2)
CO2: 22 mmol/L (ref 20–29)
Calcium: 9.7 mg/dL (ref 8.7–10.3)
Chloride: 103 mmol/L (ref 96–106)
Creatinine, Ser: 1.56 mg/dL — ABNORMAL HIGH (ref 0.57–1.00)
Globulin, Total: 2.5 g/dL (ref 1.5–4.5)
Glucose: 118 mg/dL — ABNORMAL HIGH (ref 70–99)
Potassium: 4.5 mmol/L (ref 3.5–5.2)
Sodium: 138 mmol/L (ref 134–144)
Total Protein: 6.8 g/dL (ref 6.0–8.5)
eGFR: 36 mL/min/{1.73_m2} — ABNORMAL LOW (ref >59)

## 2021-08-14 LAB — VITAMIN D 25 HYDROXY (VIT D DEFICIENCY, FRACTURES): Vit D, 25-Hydroxy: 24.9 ng/mL — ABNORMAL LOW (ref 30.0–100.0)

## 2021-08-14 LAB — ALPHA-1-ANTITRYPSIN DEFICIENCY

## 2021-08-14 LAB — LIPID PANEL W/O CHOL/HDL RATIO
Cholesterol, Total: 180 mg/dL (ref 100–199)
HDL: 45 mg/dL (ref >39)
LDL Chol Calc (NIH): 94 mg/dL (ref 0–99)
Triglycerides: 246 mg/dL — ABNORMAL HIGH (ref 0–149)
VLDL Cholesterol Cal: 41 mg/dL — ABNORMAL HIGH (ref 5–40)

## 2021-08-14 LAB — T4, FREE: Free T4: 0.96 ng/dL (ref 0.82–1.77)

## 2021-08-14 LAB — TSH: TSH: 5.6 u[IU]/mL — ABNORMAL HIGH (ref 0.450–4.500)

## 2021-08-19 ENCOUNTER — Telehealth: Payer: Self-pay

## 2021-08-19 NOTE — Chronic Care Management (AMB) (Signed)
    Chronic Care Management Pharmacy Assistant   Name: Kristen Mills  MRN: [id number] DOB: [birth year]  Reason for Encounter: Disease State General   Recent office visits:  08/02/21-Kristen T. Ned Card, NP (PCP) Annual exam visit. Labs ordered. Increase Levothyroxine to 50 mg and increase Atorvastatin to 20 mg. Follow up in 6 months.  Recent consult visits:  None noted  Hospital visits:  None in previous 6 months  Medications: Outpatient Encounter Medications as of 08/19/2021  Medication Sig   albuterol (VENTOLIN HFA) 108 (90 Base) MCG/ACT inhaler INHALE 2 PUFFS EVERY 4 TO 6 HOURS AS NEEDED FOR SHORTNESS OF BREATH   atorvastatin (LIPITOR) 20 MG tablet Take 1 tablet (20 mg total) by mouth daily.   buPROPion (WELLBUTRIN XL) 150 MG 24 hr tablet TAKE 2 TABLETS (300 MG TOTAL) BY MOUTH ONCE DAILY.   Cholecalciferol 1.25 MG (50000 UT) capsule Take 1 capsule (50,000 Units total) by mouth once a week.   gabapentin (NEURONTIN) 300 MG capsule Take 1 capsule (300 mg total) by mouth 3 (three) times daily.   hydrOXYzine (ATARAX) 10 MG tablet TAKE 1 TABLET BY MOUTH TWICE A DAY AS NEEDED   levothyroxine (SYNTHROID) 50 MCG tablet Take 1 tablet (50 mcg total) by mouth daily.   telmisartan (MICARDIS) 20 MG tablet Take 1 tablet (20 mg total) by mouth daily.   Tiotropium Bromide-Olodaterol 2.5-2.5 MCG/ACT AERS Inhale 2 puffs into the lungs daily.   traZODone (DESYREL) 50 MG tablet Take 1 tablet (50 mg total) by mouth at bedtime.   No facility-administered encounter medications on file as of 08/19/2021.   Contacted Kristen Mills for General Review Call  Unsuccessful attempts to complete assessment call. I have called patient 3x and left 3 voicemail's for the patient to return my call when available.   Care Gaps: OPHTHALMOLOGY EXAM:Never done MAMMOGRAM:Never done DEXA SCAN:Never done  Star Rating Drugs: Atorvastatin 20 mg Last filled:08/09/21 90 DS Telmisartan 20 mg Last filled:01/20/21 90  DS  [name], [name]

## 2021-09-22 ENCOUNTER — Other Ambulatory Visit: Payer: PPO

## 2021-09-22 DIAGNOSIS — E039 Hypothyroidism, unspecified: Secondary | ICD-10-CM

## 2021-09-22 DIAGNOSIS — E782 Mixed hyperlipidemia: Secondary | ICD-10-CM

## 2021-09-23 LAB — TSH: TSH: 0.887 u[IU]/mL (ref 0.450–4.500)

## 2021-09-23 LAB — LIPID PANEL W/O CHOL/HDL RATIO
Cholesterol, Total: 130 mg/dL (ref 100–199)
HDL: 44 mg/dL (ref >39)
LDL Chol Calc (NIH): 56 mg/dL (ref 0–99)
Triglycerides: 178 mg/dL — ABNORMAL HIGH (ref 0–149)
VLDL Cholesterol Cal: 30 mg/dL (ref 5–40)

## 2021-09-23 LAB — T4, FREE: Free T4: 1.24 ng/dL (ref 0.82–1.77)

## 2021-09-23 NOTE — Progress Notes (Signed)
Good morning, please let Alisse know that labs have returned and overall much improved.  Continue current doses of all medications.  Any questions? Keep being stellar!!  Thank you for allowing me to participate in your care.  I appreciate you. Kindest regards, Tysha Grismore

## 2021-11-03 ENCOUNTER — Other Ambulatory Visit: Payer: Self-pay | Admitting: Nurse Practitioner

## 2021-11-03 NOTE — Telephone Encounter (Signed)
Requested Prescriptions  Pending Prescriptions Disp Refills  . hydrOXYzine (ATARAX) 10 MG tablet [Pharmacy Med Name: HYDROXYZINE HCL 10 MG TAB] 180 tablet 0    Sig: TAKE 1 TABLET BY MOUTH TWICE A DAY AS NEEDED     Ear, Nose, and Throat:  Antihistamines 2 Failed - 11/03/2021 10:24 AM      Failed - Cr in normal range and within 360 days    Creatinine  Date Value Ref Range Status  02/08/2012 0.66 0.60 - 1.30 mg/dL Final   Creatinine, Ser  Date Value Ref Range Status  08/02/2021 1.56 (H) 0.57 - 1.00 mg/dL Final         Passed - Valid encounter within last 12 months    Recent Outpatient Visits          3 months ago Simple chronic bronchitis ([place])   [place] Cannady, Jolene T, NP   9 months ago Stage 3b chronic kidney disease ([place])   [place] Cannady, Jolene T, NP   1 year ago Stage 3a chronic kidney disease ([place])   [place] Cannady, Jolene T, NP   1 year ago Cocaine abuse ([place])   [place], Jolene T, NP   1 year ago Simple chronic bronchitis ([place])   [place], Barbaraann Faster, NP      Future Appointments            In 3 months Cannady, Barbaraann Faster, NP [employer]

## 2021-11-24 ENCOUNTER — Telehealth: Payer: Self-pay

## 2021-11-24 NOTE — Telephone Encounter (Signed)
Called and spoke to the patient regarding health maintenance. Patient states she has not had a recent eye exam and does not want to have a mammogram right now.

## 2021-11-29 IMAGING — US US RENAL
1 series · 14 of 25 positions shown · non-contrast
Comparison: PET-CT 04/01/2005.

CLINICAL DATA: Abnormal finding in urine.  Chronic renal disease.

EXAM:
RENAL / URINARY TRACT ULTRASOUND COMPLETE

[Series 1: us renal · 0.23mm/px · 14 of 45 slices shown]
[im 1/45]
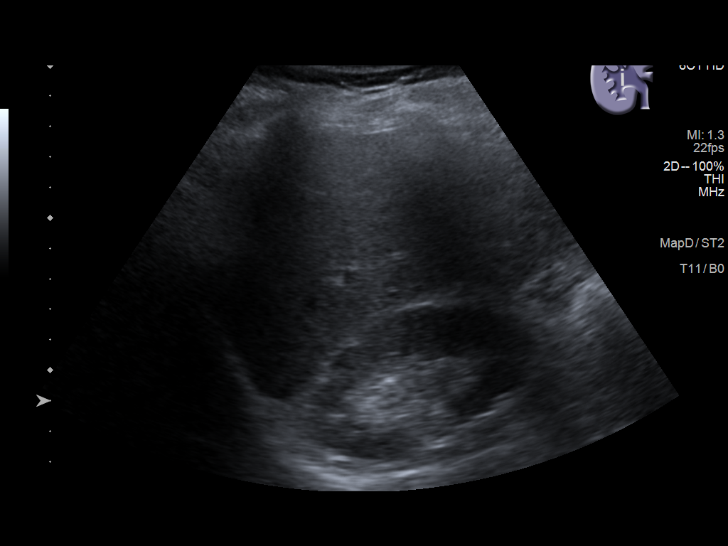
[im 4/45]
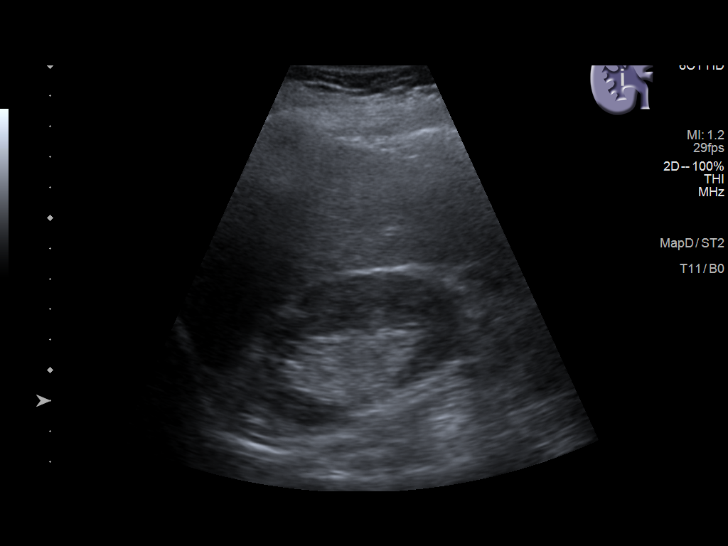
[im 8/45]
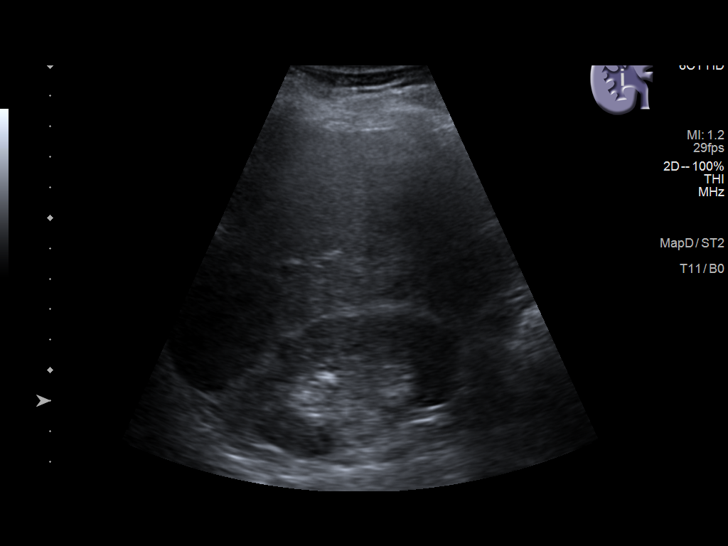
[im 12/45]
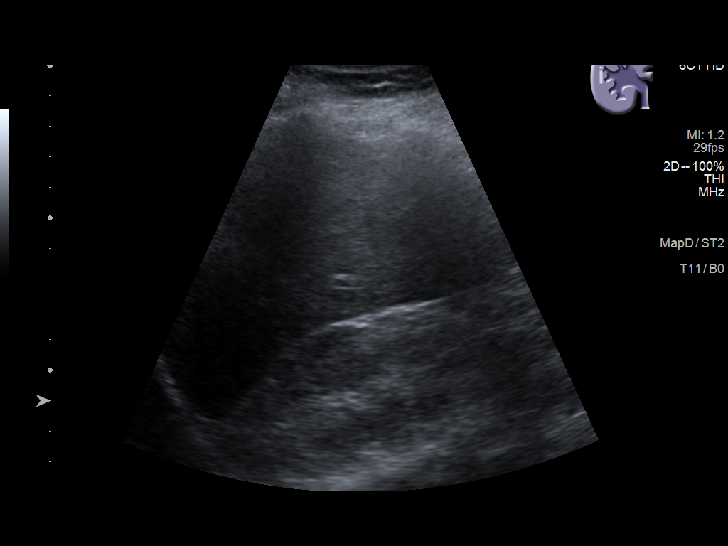
[im 15/45]
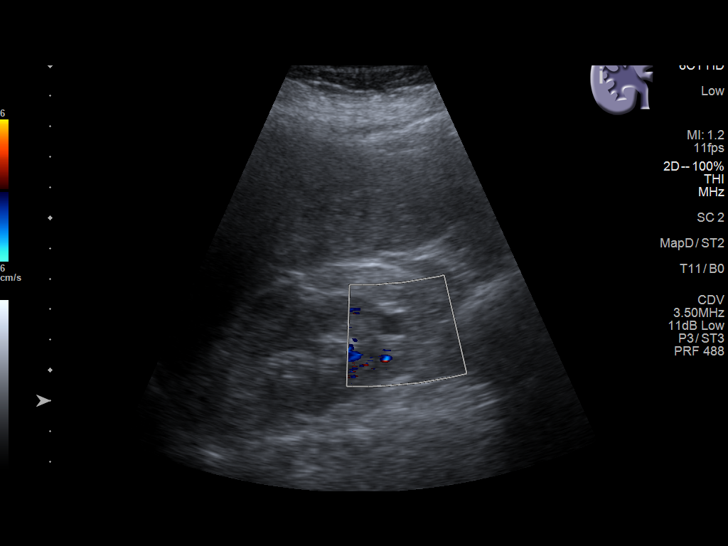
[im 17/45]
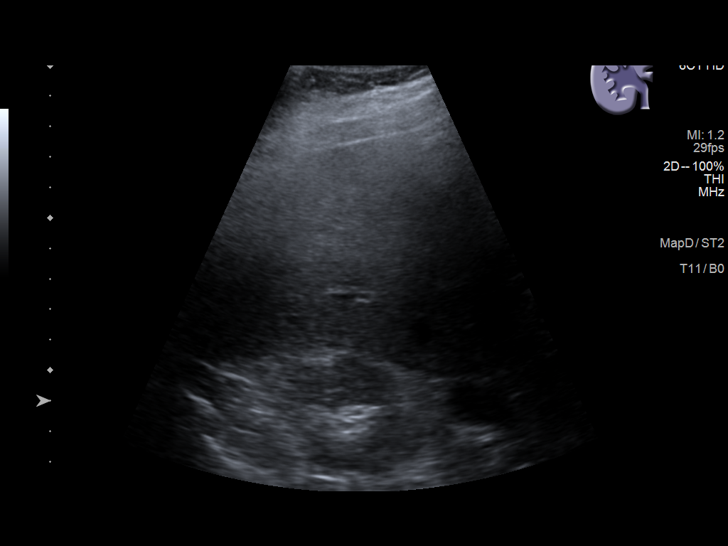
[im 21/45]
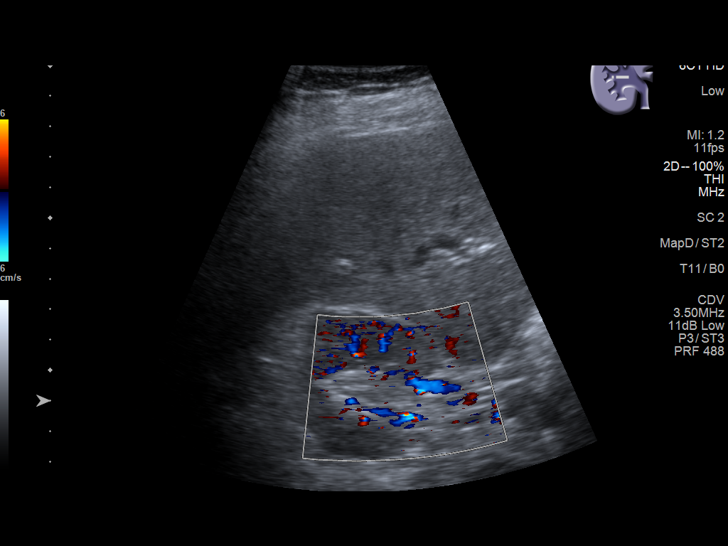
[im 24/45]
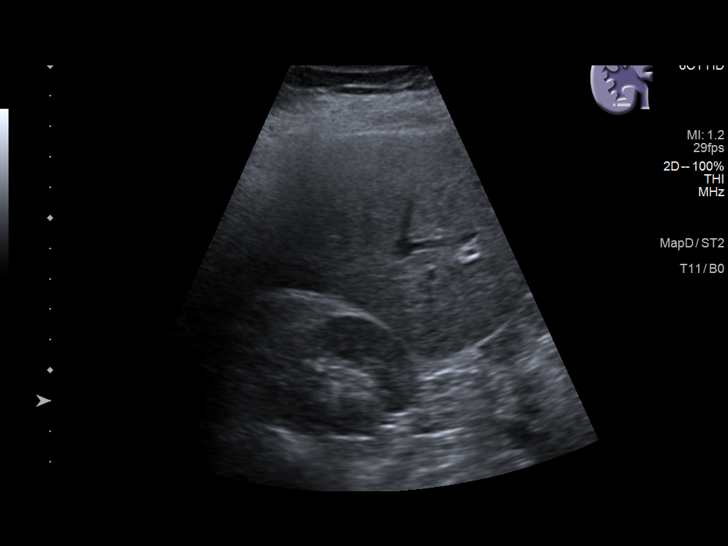
[im 28/45]
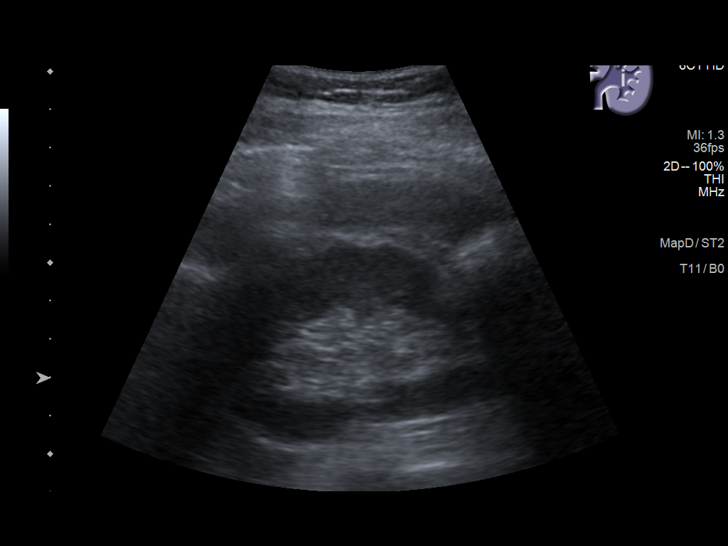
[im 30/45]
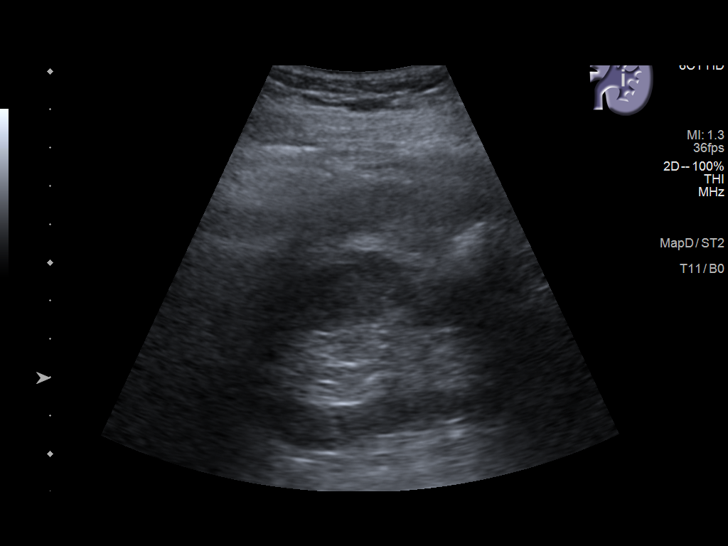
[im 34/45]
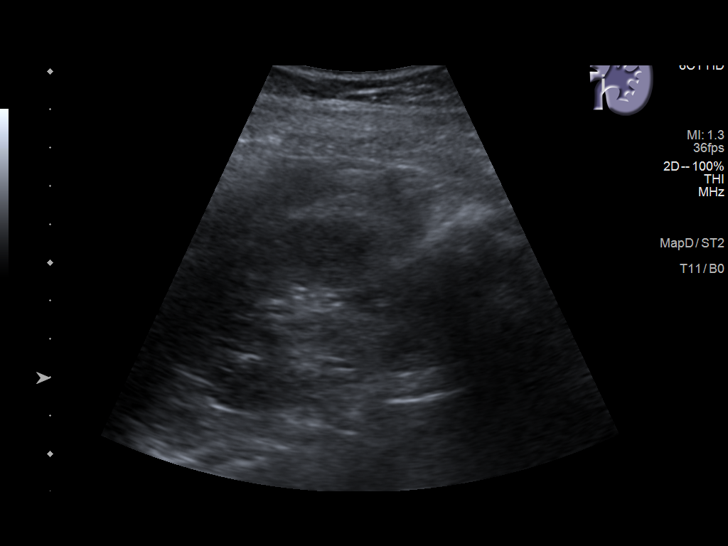
[im 37/45]
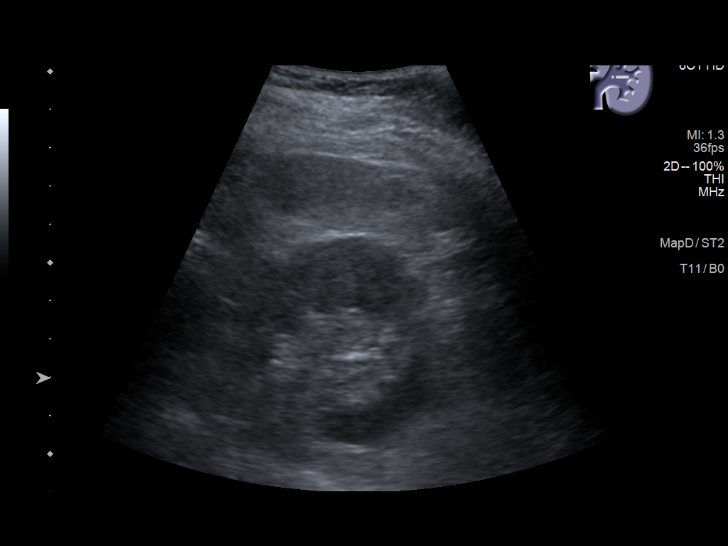
[im 41/45]
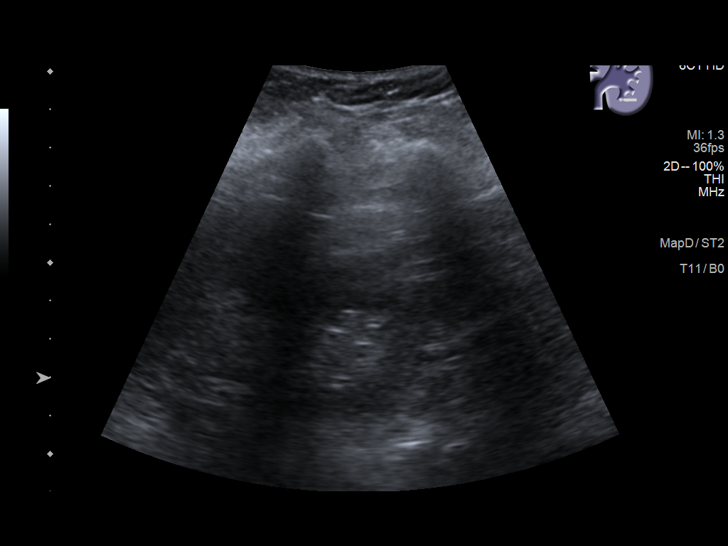
[im 45/45]
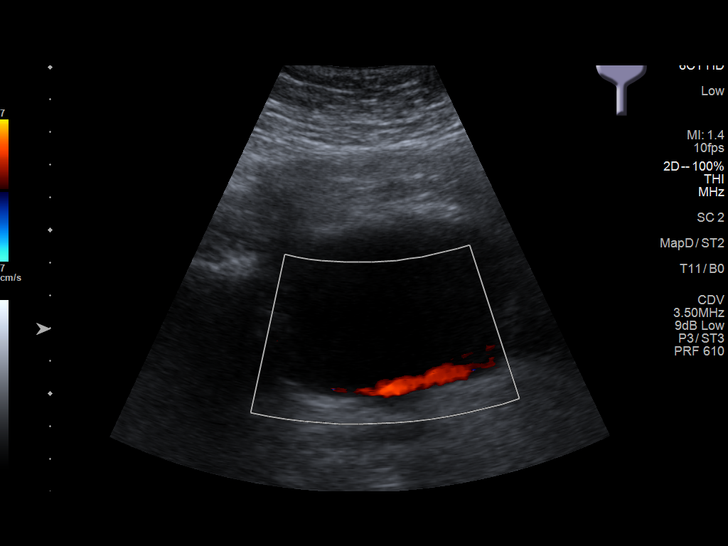

[14 of 25 positions shown; findings below may reference images not displayed]

FINDINGS: Right Kidney:

Renal measurements: 7.8 x 4.3 x 3.8 cm = volume: 67.3 mL.
Echogenicity within normal limits. Cortical irregularity consistent
scarring. 8 mm simple cyst. 8 mm faintly shadowing echogenic focus
noted over the right renal pelvis consistent with a nonobstructing
tiny stone. No hydronephrosis visualized.

Left Kidney:

Renal measurements: 7.9 x 4.6 x 3.9 cm = volume: 73.7 mL.
Echogenicity within normal limits. Cortical irregularity consistent
with scarring. No mass or hydronephrosis visualized.

Bladder:

Appears normal for degree of bladder distention.

Other:

None.
IMPRESSION: 1. Bilateral renal cortical irregularity consistent with scarring.
No acute abnormality identified. No hydronephrosis or bladder
distention.

2. Right renal 8 mm simple cyst. 8 mm faintly shadowing echogenic
focus noted the right renal pelvis consistent with a nonobstructing
stone.

## 2021-12-09 ENCOUNTER — Telehealth: Payer: Self-pay

## 2021-12-09 NOTE — Telephone Encounter (Signed)
Called and LVM asking for patient to please return my call to schedule AWV. Last one was 12/18/20, must be scheduled AFTER 12/18/21 with nurse health advisor.

## 2021-12-15 ENCOUNTER — Telehealth: Payer: Self-pay | Admitting: Nurse Practitioner

## 2021-12-15 DIAGNOSIS — Z5941 Food insecurity: Secondary | ICD-10-CM

## 2021-12-15 NOTE — Telephone Encounter (Signed)
Copied from [place] [id number]. Topic: General - Other >> Dec 15, 2021  1:19 PM Oley Balm E wrote: Reason for CRM: Pt called requesting assistance for food, she says that her PCP helped her last year Best contact: [phone]

## 2021-12-15 NOTE — Telephone Encounter (Signed)
Spoke with patient and made her aware of Jolene's recommendations. Patient verbalized understanding and has no further questions.

## 2021-12-16 ENCOUNTER — Telehealth: Payer: Self-pay

## 2021-12-16 NOTE — Telephone Encounter (Signed)
   Telephone encounter was:  Successful.  12/16/2021 Name: [name] MRN: [id number] DOB: 12/25/1950  Kristen Mills is a 71 y.o. year old female who is a primary care patient of Cannady, Barbaraann Faster, NP . The community resource team was consulted for assistance with Food Insecurity and Financial Difficulties related to financial strain  Care guide performed the following interventions: Patient provided with information about care guide support team and interviewed to confirm resource needs.Patient has paid all of her bills and cant afford food. She isnt sure if she will have enough food for the month. I gave resources over the phone, added a referral in [place] [facility] and mailed resources  Follow Up Plan:  No further follow up planned at this time. The patient has been provided with needed resources.    [place], Care Management  [phone] [address], [place], [place] [address] Phone: [phone] Email: [email or web address]

## 2021-12-22 ENCOUNTER — Ambulatory Visit (INDEPENDENT_AMBULATORY_CARE_PROVIDER_SITE_OTHER): Payer: PPO | Admitting: *Deleted

## 2021-12-22 DIAGNOSIS — Z0000 Encounter for general adult medical examination without abnormal findings: Secondary | ICD-10-CM | POA: Diagnosis not present

## 2021-12-22 NOTE — Patient Instructions (Signed)
Ms. Kristen Mills , Thank you for taking time to come for your Medicare Wellness Visit. I appreciate your ongoing commitment to your health goals. Please review the following plan we discussed and let me know if I can assist you in the future.   Screening recommendations/referrals: Colonoscopy: Education provided Mammogram: Education provided Bone Density: Education provided Recommended yearly ophthalmology/optometry visit for glaucoma screening and checkup Recommended yearly dental visit for hygiene and checkup  Vaccinations: Influenza vaccine: up to date Pneumococcal vaccine: up to date Tdap vaccine: up to date Shingles vaccine: Education provided    Advanced directives: Education provided   Next appointment: 02-02-2022 @ 2:20  [employer] 70 Years and Older, Female Preventive care refers to lifestyle choices and visits with your health care provider that can promote health and wellness. What does preventive care include? A yearly physical exam. This is also called an annual well check. Dental exams once or twice a year. Routine eye exams. Ask your health care provider how often you should have your eyes checked. Personal lifestyle choices, including: Daily care of your teeth and gums. Regular physical activity. Eating a healthy diet. Avoiding tobacco and drug use. Limiting alcohol use. Practicing safe sex. Taking low-dose aspirin every day. Taking vitamin and mineral supplements as recommended by your health care provider. What happens during an annual well check? The services and screenings done by your health care provider during your annual well check will depend on your age, overall health, lifestyle risk factors, and family history of disease. Counseling  Your health care provider may ask you questions about your: Alcohol use. Tobacco use. Drug use. Emotional well-being. Home and relationship well-being. Sexual activity. Eating habits. History of  falls. Memory and ability to understand (cognition). Work and work Statistician. Reproductive health. Screening  You may have the following tests or measurements: Height, weight, and BMI. Blood pressure. Lipid and cholesterol levels. These may be checked every 5 years, or more frequently if you are over 1 years old. Skin check. Lung cancer screening. You may have this screening every year starting at age 70 if you have a 30-pack-year history of smoking and currently smoke or have quit within the past 15 years. Fecal occult blood test (FOBT) of the stool. You may have this test every year starting at age 70. Flexible sigmoidoscopy or colonoscopy. You may have a sigmoidoscopy every 5 years or a colonoscopy every 10 years starting at age 19. Hepatitis C blood test. Hepatitis B blood test. Sexually transmitted disease (STD) testing. Diabetes screening. This is done by checking your blood sugar (glucose) after you have not eaten for a while (fasting). You may have this done every 1-3 years. Bone density scan. This is done to screen for osteoporosis. You may have this done starting at age 70. Mammogram. This may be done every 1-2 years. Talk to your health care provider about how often you should have regular mammograms. Talk with your health care provider about your test results, treatment options, and if necessary, the need for more tests. Vaccines  Your health care provider may recommend certain vaccines, such as: Influenza vaccine. This is recommended every year. Tetanus, diphtheria, and acellular pertussis (Tdap, Td) vaccine. You may need a Td booster every 10 years. Zoster vaccine. You may need this after age 70. Pneumococcal 13-valent conjugate (PCV13) vaccine. One dose is recommended after age 70. Pneumococcal polysaccharide (PPSV23) vaccine. One dose is recommended after age 70. Talk to your health care provider about which screenings and vaccines  you need and how often you need  them. This information is not intended to replace advice given to you by your health care provider. Make sure you discuss any questions you have with your health care provider. Document Released: 04/10/2015 Document Revised: 12/02/2015 Document Reviewed: 01/13/2015 Elsevier Interactive Patient Education  2017 [place] Prevention in the Home Falls can cause injuries. They can happen to people of all ages. There are many things you can do to make your home safe and to help prevent falls. What can I do on the outside of my home? Regularly fix the edges of walkways and driveways and fix any cracks. Remove anything that might make you trip as you walk through a door, such as a raised step or threshold. Trim any bushes or trees on the path to your home. Use bright outdoor lighting. Clear any walking paths of anything that might make someone trip, such as rocks or tools. Regularly check to see if handrails are loose or broken. Make sure that both sides of any steps have handrails. Any raised decks and porches should have guardrails on the edges. Have any leaves, snow, or ice cleared regularly. Use sand or salt on walking paths during winter. Clean up any spills in your garage right away. This includes oil or grease spills. What can I do in the bathroom? Use night lights. Install grab bars by the toilet and in the tub and shower. Do not use towel bars as grab bars. Use non-skid mats or decals in the tub or shower. If you need to sit down in the shower, use a plastic, non-slip stool. Keep the floor dry. Clean up any water that spills on the floor as soon as it happens. Remove soap buildup in the tub or shower regularly. Attach bath mats securely with double-sided non-slip rug tape. Do not have throw rugs and other things on the floor that can make you trip. What can I do in the bedroom? Use night lights. Make sure that you have a light by your bed that is easy to reach. Do not use  any sheets or blankets that are too big for your bed. They should not hang down onto the floor. Have a firm chair that has side arms. You can use this for support while you get dressed. Do not have throw rugs and other things on the floor that can make you trip. What can I do in the kitchen? Clean up any spills right away. Avoid walking on wet floors. Keep items that you use a lot in easy-to-reach places. If you need to reach something above you, use a strong step stool that has a grab bar. Keep electrical cords out of the way. Do not use floor polish or wax that makes floors slippery. If you must use wax, use non-skid floor wax. Do not have throw rugs and other things on the floor that can make you trip. What can I do with my stairs? Do not leave any items on the stairs. Make sure that there are handrails on both sides of the stairs and use them. Fix handrails that are broken or loose. Make sure that handrails are as long as the stairways. Check any carpeting to make sure that it is firmly attached to the stairs. Fix any carpet that is loose or worn. Avoid having throw rugs at the top or bottom of the stairs. If you do have throw rugs, attach them to the floor with carpet tape. Make sure  that you have a light switch at the top of the stairs and the bottom of the stairs. If you do not have them, ask someone to add them for you. What else can I do to help prevent falls? Wear shoes that: Do not have high heels. Have rubber bottoms. Are comfortable and fit you well. Are closed at the toe. Do not wear sandals. If you use a stepladder: Make sure that it is fully opened. Do not climb a closed stepladder. Make sure that both sides of the stepladder are locked into place. Ask someone to hold it for you, if possible. Clearly mark and make sure that you can see: Any grab bars or handrails. First and last steps. Where the edge of each step is. Use tools that help you move around (mobility aids)  if they are needed. These include: Canes. Walkers. Scooters. Crutches. Turn on the lights when you go into a dark area. Replace any light bulbs as soon as they burn out. Set up your furniture so you have a clear path. Avoid moving your furniture around. If any of your floors are uneven, fix them. If there are any pets around you, be aware of where they are. Review your medicines with your doctor. Some medicines can make you feel dizzy. This can increase your chance of falling. Ask your doctor what other things that you can do to help prevent falls. This information is not intended to replace advice given to you by your health care provider. Make sure you discuss any questions you have with your health care provider. Document Released: 01/08/2009 Document Revised: 08/20/2015 Document Reviewed: 04/18/2014 Elsevier Interactive Patient Education  2017 [employer].

## 2021-12-22 NOTE — Progress Notes (Signed)
Subjective:   Kristen Mills is a 71 y.o. female who presents for Medicare Annual (Subsequent) preventive examination.  I connected with  [name] on 12/22/21 by a telephone enabled telemedicine application and verified that I am speaking with the correct person using two identifiers.   I discussed the limitations of evaluation and management by telemedicine. The patient expressed understanding and agreed to proceed.  Patient location: home  Provider location:  Tele-health-home    Review of Systems     Cardiac Risk Factors include: advanced age (>22mn, >>36women);hypertension;smoking/ tobacco exposure;family history of premature cardiovascular disease;sedentary lifestyle     Objective:    Today's Vitals   There is no height or weight on file to calculate BMI.     12/22/2021   12:27 PM 12/18/2020    4:34 PM 08/14/2019    2:47 PM 06/19/2016    7:06 PM  Advanced Directives  Does Patient Have a Medical Advance Directive? No No No No  Does patient want to make changes to medical advance directive?   Yes (MAU/Ambulatory/Procedural Areas - Information given)   Would patient like information on creating a medical advance directive? No - Patient declined   Yes (ED - Information included in AVS)    Current Medications (verified) Outpatient Encounter Medications as of 12/22/2021  Medication Sig   albuterol (VENTOLIN HFA) 108 (90 Base) MCG/ACT inhaler INHALE 2 PUFFS EVERY 4 TO 6 HOURS AS NEEDED FOR SHORTNESS OF BREATH   atorvastatin (LIPITOR) 20 MG tablet Take 1 tablet (20 mg total) by mouth daily.   buPROPion (WELLBUTRIN XL) 150 MG 24 hr tablet TAKE 2 TABLETS (300 MG TOTAL) BY MOUTH ONCE DAILY.   Cholecalciferol 1.25 MG (50000 UT) capsule Take 1 capsule (50,000 Units total) by mouth once a week.   gabapentin (NEURONTIN) 300 MG capsule Take 1 capsule (300 mg total) by mouth 3 (three) times daily.   hydrOXYzine (ATARAX) 10 MG tablet TAKE 1 TABLET BY MOUTH TWICE A DAY AS NEEDED    levothyroxine (SYNTHROID) 50 MCG tablet Take 1 tablet (50 mcg total) by mouth daily.   telmisartan (MICARDIS) 20 MG tablet Take 1 tablet (20 mg total) by mouth daily.   Tiotropium Bromide-Olodaterol 2.5-2.5 MCG/ACT AERS Inhale 2 puffs into the lungs daily.   traZODone (DESYREL) 50 MG tablet Take 1 tablet (50 mg total) by mouth at bedtime.   No facility-administered encounter medications on file as of 12/22/2021.    Allergies (verified) Patient has no known allergies.   History: Past Medical History:  Diagnosis Date   Asthma    Cancer ([place]    Uterine Cancer   COPD (chronic obstructive pulmonary disease) ([place]    Depression    Diabetes mellitus without complication ([place]    Hyperlipidemia    Hypertension    Insomnia    Polysubstance abuse ([place]    Spinal stenosis    Thyroid condition    Past Surgical History:  Procedure Laterality Date   ABDOMINAL HYSTERECTOMY     Family History  Problem Relation Age of Onset   Cancer Mother        Lung   Cancer Father        Lung   Heart disease Sister    Heart attack Sister    Cancer Brother        Lung   Bipolar disorder Son    Social History   Socioeconomic History   Marital status: Single    Spouse name: Not on file  Number of children: Not on file   Years of education: Not on file   Highest education level: Not on file  Occupational History   Not on file  Tobacco Use   Smoking status: Every Day    Packs/day: 1.00    Years: 45.00    Total pack years: 45.00    Types: Cigarettes   Smokeless tobacco: Never  Vaping Use   Vaping Use: Former  Substance and Sexual Activity   Alcohol use: Yes    Alcohol/week: 3.0 standard drinks of alcohol    Types: 3 Cans of beer per week    Comment: Few beers daily.   Drug use: Yes    Types: "Crack" cocaine, Marijuana, Cocaine    Comment: currently cocaine   Sexual activity: Not Currently  Other Topics Concern   Not on file  Social History Narrative   Not on file   Social  Determinants of Health   Financial Resource Strain: High Risk (12/22/2021)   Overall Financial Resource Strain (CARDIA)    Difficulty of Paying Living Expenses: Hard  Food Insecurity: Food Insecurity Present (12/22/2021)   Hunger Vital Sign    Worried About Running Out of Food in the Last Year: Often true    Ran Out of Food in the Last Year: Often true  Transportation Needs: No Transportation Needs (12/22/2021)   PRAPARE - Hydrologist (Medical): No    Lack of Transportation (Non-Medical): No  Physical Activity: Inactive (12/22/2021)   Exercise Vital Sign    Days of Exercise per Week: 0 days    Minutes of Exercise per Session: 0 min  Stress: Stress Concern Present (12/22/2021)   [place]    Feeling of Stress : To some extent  Social Connections: Unknown (12/22/2021)   Social Connection and Isolation Panel [NHANES]    Frequency of Communication with Friends and Family: More than three times a week    Frequency of Social Gatherings with Friends and Family: Twice a week    Attends Religious Services: Never    Marine scientist or Organizations: No    Attends Music therapist: Never    Marital Status: Not on file    Tobacco Counseling Ready to quit: Not Answered Counseling given: Not Answered   Clinical Intake:  Pre-visit preparation completed: Yes  Pain : No/denies pain     Diabetes: No  How often do you need to have someone help you when you read instructions, pamphlets, or other written materials from your doctor or pharmacy?: 1 - Never  Diabetic?  No   Interpreter Needed?: No  Information entered by :: Leroy Kennedy LPN   Activities of Daily Living    12/22/2021   12:28 PM  In your present state of health, do you have any difficulty performing the following activities:  Hearing? 0  Vision? 0  Difficulty concentrating or making decisions? 0  Walking or  climbing stairs? 1  Comment sob  Dressing or bathing? 0  Doing errands, shopping? 0  Preparing Food and eating ? N  Using the Toilet? N  In the past six months, have you accidently leaked urine? N  Do you have problems with loss of bowel control? N  Managing your Medications? N  Managing your Finances? N  Housekeeping or managing your Housekeeping? N    Patient Care Team: [name], NP as PCP - General (Nurse Practitioner)  Indicate any recent  Medical Services you may have received from other than [facility] providers in the past year (date may be approximate).     Assessment:   This is a routine wellness examination for Zoee.  Hearing/Vision screen Hearing Screening - Comments:: No trouble hearing Vision Screening - Comments:: Not up to date  Dietary issues and exercise activities discussed: Current Exercise Habits: The patient does not participate in regular exercise at present, Exercise limited by: respiratory conditions(s)   Goals Addressed             This Visit's Progress    Patient Stated       No goals       Depression Screen    12/22/2021   12:33 PM 08/02/2021    1:47 PM 01/20/2021    4:29 PM 12/18/2020   11:28 AM 10/20/2020    2:03 PM 07/21/2020    3:03 PM 01/21/2020    4:15 PM  PHQ 2/9 Scores  PHQ - 2 Score '1 1 1 1 3 3 '$ 0  PHQ- 9 Score '1 2 6 5 6 6 2    '$ Fall Risk    12/22/2021   12:27 PM 08/02/2021    1:48 PM 12/18/2020    4:34 PM 10/20/2020    2:03 PM 08/14/2019    2:47 PM  Fall Risk   Falls in the past year? 0 0 0 0 0  Number falls in past yr: 0 0 0 0 0  Injury with Fall? 0 0 0 0 0  Risk for fall due to :  No Fall Risks No Fall Risks No Fall Risks   Follow up Falls evaluation completed;Education provided;Falls prevention discussed Falls evaluation completed Falls evaluation completed Falls evaluation completed     FALL RISK PREVENTION PERTAINING TO THE HOME:  Any stairs in or around the home? No  If so, are there any without handrails? No   Home free of loose throw rugs in walkways, pet beds, electrical cords, etc? Yes  Adequate lighting in your home to reduce risk of falls? Yes   ASSISTIVE DEVICES UTILIZED TO PREVENT FALLS:  Life alert? No  Use of a cane, walker or w/c? No  Grab bars in the bathroom? Yes  Shower chair or bench in shower? Yes  Elevated toilet seat or a handicapped toilet? No   TIMED UP AND GO:  Was the test performed? No .    Cognitive Function:        12/22/2021   12:28 PM 12/18/2020    4:34 PM 08/14/2019    2:51 PM  6CIT Screen  What Year? 0 points 0 points 0 points  What month? 0 points 0 points 0 points  What time? 0 points 0 points 0 points  Count back from 20 0 points 0 points 0 points  Months in reverse 2 points 2 points 0 points  Repeat phrase 4 points 0 points 0 points  Total Score 6 points 2 points 0 points    Immunizations Immunization History  Administered Date(s) Administered   Fluad Quad(high Dose 65+) 12/11/2018, 01/21/2020, 01/20/2021   Influenza, High Dose Seasonal PF 02/07/2018   Moderna Sars-Covid-2 Vaccination 04/30/2020   Pneumococcal Conjugate-13 02/07/2018   Pneumococcal Polysaccharide-23 08/14/2019   Tdap 06/19/2016    TDAP status: Up to date  Flu Vaccine status: Up to date  Pneumococcal vaccine status: Up to date  Covid-19 vaccine status: Information provided on how to obtain vaccines.   Qualifies for Shingles Vaccine? Yes   Zostavax completed No  Shingrix Completed?: No.    Education has been provided regarding the importance of this vaccine. Patient has been advised to call insurance company to determine out of pocket expense if they have not yet received this vaccine. Advised may also receive vaccine at local pharmacy or Health Dept. Verbalized acceptance and understanding.  Screening Tests Health Maintenance  Topic Date Due   OPHTHALMOLOGY EXAM  Never done   MAMMOGRAM  Never done   DEXA SCAN  Never done   INFLUENZA VACCINE  10/26/2021    COVID-19 Vaccine (2 - Moderna series) 01/07/2022 (Originally 06/25/2020)   Zoster Vaccines- Shingrix (1 of 2) 03/23/2022 (Originally 02/22/2002)   COLONOSCOPY (Pts 45-4yr Insurance coverage will need to be confirmed)  08/03/2022 (Originally 02/22/1997)   HEMOGLOBIN A1C  02/02/2022   Diabetic kidney evaluation - GFR measurement  08/03/2022   Diabetic kidney evaluation - Urine ACR  08/03/2022   TETANUS/TDAP  06/20/2026   Pneumonia Vaccine 70 Years old  Completed   Hepatitis C Screening  Completed   HPV VACCINES  Aged Out   FOOT EXAM  Discontinued    Health Maintenance  Health Maintenance Due  Topic Date Due   OPHTHALMOLOGY EXAM  Never done   MAMMOGRAM  Never done   DEXA SCAN  Never done   INFLUENZA VACCINE  10/26/2021    Colonoscopy declined  Mammogram  Educaiton provided  Bone Density  Education  Lung Cancer Screening: (Low Dose CT Chest recommended if Age 70-80years, 30 pack-year currently smoking OR have quit w/in 15years.) does not qualify.   Lung Cancer Screening Referral:   Additional Screening:  Hepatitis C Screening: does not qualify; Completed 2023  Vision Screening: Recommended annual ophthalmology exams for early detection of glaucoma and other disorders of the eye. Is the patient up to date with their annual eye exam?  No  Who is the provider or what is the name of the office in which the patient attends annual eye exams?  If pt is not established with a provider, would they like to be referred to a provider to establish care? No .   Dental Screening: Recommended annual dental exams for proper oral hygiene  Community Resource Referral / Chronic Care Management: CRR required this visit?  No   CCM required this visit?  No      Plan:     I have personally reviewed and noted the following in the patient's chart:   Medical and social history Use of alcohol, tobacco or illicit drugs  Current medications and supplements including opioid prescriptions.  Patient is not currently taking opioid prescriptions. Functional ability and status Nutritional status Physical activity Advanced directives List of other physicians Hospitalizations, surgeries, and ER visits in previous 12 months Vitals Screenings to include cognitive, depression, and falls Referrals and appointments  In addition, I have reviewed and discussed with patient certain preventive protocols, quality metrics, and best practice recommendations. A written personalized care plan for preventive services as well as general preventive health recommendations were provided to patient.     JLeroy Kennedy LPN   [id number]  Nurse Notes:

## 2022-02-02 ENCOUNTER — Ambulatory Visit: Payer: PPO | Admitting: Nurse Practitioner

## 2022-02-07 ENCOUNTER — Other Ambulatory Visit: Payer: Self-pay | Admitting: Nurse Practitioner

## 2022-02-08 NOTE — Telephone Encounter (Signed)
Requested Prescriptions  Pending Prescriptions Disp Refills   hydrOXYzine (ATARAX) 10 MG tablet [Pharmacy Med Name: HYDROXYZINE HCL 10 MG TAB] 180 tablet 0    Sig: TAKE 1 TABLET BY MOUTH TWICE A DAY AS NEEDED     Ear, Nose, and Throat:  Antihistamines 2 Failed - 02/07/2022  1:54 PM      Failed - Cr in normal range and within 360 days    Creatinine  Date Value Ref Range Status  02/08/2012 0.66 0.60 - 1.30 mg/dL Final   Creatinine, Ser  Date Value Ref Range Status  08/02/2021 1.56 (H) 0.57 - 1.00 mg/dL Final         Passed - Valid encounter within last 12 months    Recent Outpatient Visits           6 months ago Simple chronic bronchitis ([place])   [place], Henrine Screws T, NP   1 year ago Stage 3b chronic kidney disease ([place])   [place], Barbaraann Faster, NP   1 year ago Stage 3a chronic kidney disease ([place])   [place] Cannady, Jolene T, NP   1 year ago Cocaine abuse ([place])   [place], [place] T, NP   2 years ago Simple chronic bronchitis ([place])   [place], Barbaraann Faster, NP       Future Appointments             In 6 days Cannady, Barbaraann Faster, NP [employer]

## 2022-02-14 ENCOUNTER — Ambulatory Visit: Payer: PPO | Admitting: Nurse Practitioner

## 2022-03-02 ENCOUNTER — Ambulatory Visit: Payer: Self-pay

## 2022-03-02 NOTE — Telephone Encounter (Signed)
Called pt, unable to leave VM d/t mailbox full.   Summary: rx req / congestion   The patient shares that they have experienced congestion and mucus that has now turned green in color  The mucus causes the patient to experience nausea  The patient has declined to be seen for an office visit  The patient would like to be prescribed something for their congestion and discomfort  Please contact the patient further when possible

## 2022-03-02 NOTE — Telephone Encounter (Signed)
3rd attempt. Unable to reach patient after 3 attempts by [facility] NT, routing to the provider for resolution per protocol.  Called pt, unable to leave VM d/t mailbox full.       Summary: rx req / congestion    The patient shares that they have experienced congestion and mucus that has now turned green in color  The mucus causes the patient to experience nausea  The patient has declined to be seen for an office visit  The patient would like to be prescribed something for their congestion and discomfort  Please contact the patient further when possible

## 2022-03-02 NOTE — Telephone Encounter (Signed)
Pt scheduled a telephone visit due to not having a ride

## 2022-03-02 NOTE — Telephone Encounter (Signed)
2nd attempt, pt called, VM full.

## 2022-03-03 ENCOUNTER — Ambulatory Visit (INDEPENDENT_AMBULATORY_CARE_PROVIDER_SITE_OTHER): Payer: PPO | Admitting: Physician Assistant

## 2022-03-03 ENCOUNTER — Encounter: Payer: Self-pay | Admitting: Physician Assistant

## 2022-03-03 DIAGNOSIS — J410 Simple chronic bronchitis: Secondary | ICD-10-CM | POA: Diagnosis not present

## 2022-03-03 DIAGNOSIS — J019 Acute sinusitis, unspecified: Secondary | ICD-10-CM

## 2022-03-03 DIAGNOSIS — B9689 Other specified bacterial agents as the cause of diseases classified elsewhere: Secondary | ICD-10-CM

## 2022-03-03 MED ORDER — DOXYCYCLINE HYCLATE 100 MG PO TABS: 100.0000 mg | ORAL_TABLET | Freq: Two times a day (BID) | ORAL | 0 refills | Status: AC

## 2022-03-03 NOTE — Patient Instructions (Addendum)
   Please use Mucinex to help break up the mucus and phlegm  You can use Robitussin to help with the coughing  I have sent in a script for an antibiotic called Doxycycline - please take this twice per day by mouth for 7 days until the course is completed   If you are not feeling better after your finish you antibiotic or if you start to feel worse- please come in to the office for evaluation

## 2022-03-03 NOTE — Assessment & Plan Note (Signed)
Chronic, ongoing Concerned for acute exacerbation due to current illness  Reviewed using inhalers and chronic regimen to help assist with symptoms Follow up in office for persistent or progressing symptoms

## 2022-03-03 NOTE — Progress Notes (Signed)
Virtual Visit via Telephone Note  I connected with Kristen Mills on 03/03/22 at 10:40 AM EST by telephone and verified that I am speaking with the correct person using two identifiers. Today's Provider: Talitha Givens, MHS, PA-C Introduced myself to the patient as a PA-C and provided education on APPs in clinical practice.   Location: Patient: At home, [place], [place]  Provider: [facility], [place], [place]    I discussed the limitations, risks, security and privacy concerns of performing an evaluation and management service by telephone and the availability of in person appointments. I also discussed with the patient that there may be a patient responsible charge related to this service. The patient expressed understanding and agreed to proceed.  Chief Complaint  Patient presents with   URI    Pt states she has been having a cough, congestion, and chills since Friday. States she has not tried any OTC medications. States she is blowing out green phlegm.      History of Present Illness:    URI- type symptoms   Repots she is having productive cough, nasal congestion States this has "moved down to her chest"  Reports she is having rhinorrhea  Onset: sudden Duration: since last Friday Reports some chest pain with coughing  Denies teeth pain sore throat   Interventions: Nothing  She has been using her Albuterol inhaler and this helps  She has been using Albuterol inhaler 3-4 times per day but this has decreased to about once per day the past two days  Reports decreased appetite   Review of Systems  Constitutional:  Positive for chills and fever.  HENT:  Positive for congestion and sinus pain. Negative for ear pain and sore throat.   Respiratory:  Positive for cough and sputum production.   Gastrointestinal:  Positive for nausea. Negative for diarrhea and vomiting.  Musculoskeletal:  Positive for myalgias.  Neurological:  Negative for dizziness and headaches.      Observations/Objective:   Due to the nature of the virtual visit, physical exam and observations are limited. Able to obtain the following observations:   Alert, oriented x 3  Sounds comfortable, in no acute distress.  mild hoarseness heard during call, but no tachypnea, wheeze or strider audibly appreciated. Able to maintain conversation without audible strain.  No cough appreciated during visit.     Assessment and Plan:  Problem List Items Addressed This Visit       Respiratory   Simple chronic bronchitis ([place])    Chronic, ongoing Concerned for acute exacerbation due to current illness  Reviewed using inhalers and chronic regimen to help assist with symptoms Follow up in office for persistent or progressing symptoms       Relevant Medications   doxycycline (VIBRA-TABS) 100 MG tablet   Other Visit Diagnoses     Acute bacterial rhinosinusitis    -  Primary Acute, new concern Reports symptoms comprised of congestion, postnasal drip, productive coughing since Friday Suspect bacterial sinusitis at this time Will provide script for Doxycycline to assist with symptoms Recommend she use OTC mucinex and robitussin to further assist with symptom management Follow up as needed in office for persistent or progressing symptoms     Relevant Medications   doxycycline (VIBRA-TABS) 100 MG tablet      Follow Up Instructions:    I discussed the assessment and treatment plan with the patient. The patient was provided an opportunity to ask questions and all were answered. The patient agreed  with the plan and demonstrated an understanding of the instructions.   The patient was advised to call back or seek an in-person evaluation if the symptoms worsen or if the condition fails to improve as anticipated.  I provided 10 minutes of non-face-to-face time during this encounter.   No follow-ups on file.   I, Bethel Gaglio E Alexine Pilant, PA-C, have reviewed all documentation for this visit.  The documentation on 03/03/22 for the exam, diagnosis, procedures, and orders are all accurate and complete.   Talitha Givens, MHS, PA-C [facility]

## 2022-05-03 ENCOUNTER — Other Ambulatory Visit: Payer: Self-pay | Admitting: Nurse Practitioner

## 2022-05-04 NOTE — Telephone Encounter (Signed)
Spoke with patient and she states she will call back in later today to schedule an appointment

## 2022-05-04 NOTE — Telephone Encounter (Signed)
Requested medication (s) are due for refill today: yes  Requested medication (s) are on the active medication list: yes  Last refill:  04/15/21 #12g/4  Future visit scheduled: no  Notes to clinic:  Unable to refill per protocol, medication not assigned to the refill protocol.    Requested Prescriptions  Pending Prescriptions Disp Refills   STIOLTO RESPIMAT 2.5-2.5 MCG/ACT AERS [Pharmacy Med Name: STIOLTO RESPIMAT 2.5-2.5 MCG/ACT IN] 12 g 4    Sig: INHALE 2 PUFFS INTO THE LUNGS DAILY     Off-Protocol Failed - 05/03/2022  2:11 PM      Failed - Medication not assigned to a protocol, review manually.      Passed - Valid encounter within last 12 months    Recent Outpatient Visits           2 months ago Acute bacterial rhinosinusitis   [place] [facility] [place], Erin E, PA-C   9 months ago Simple chronic bronchitis ([place])   [place], Henrine Screws T, NP   1 year ago Stage 3b chronic kidney disease ([place])   [place], Barbaraann Faster, NP   1 year ago Stage 3a chronic kidney disease ([place])   [place], Barbaraann Faster, NP   1 year ago Cocaine abuse [facility])   [place], Barbaraann Faster, NP

## 2022-05-11 ENCOUNTER — Telehealth: Payer: Self-pay | Admitting: Nurse Practitioner

## 2022-05-11 NOTE — Telephone Encounter (Signed)
Pt dropped off [employer] Application to be filled out by provider.  Placed in provider's folder.

## 2022-05-12 NOTE — Telephone Encounter (Signed)
Signed forms have been placed in Pharmacy folder.  Patient also needs a follow up visit per [facility]

## 2022-05-12 NOTE — Telephone Encounter (Signed)
Pharmacy team notified via teams

## 2022-07-17 NOTE — Patient Instructions (Signed)
Eating Plan for Chronic Obstructive Pulmonary Disease Chronic obstructive pulmonary disease (COPD) causes symptoms such as shortness of breath, coughing, and chest discomfort. These symptoms can make it difficult to eat enough to maintain a healthy weight. Generally, people with COPD should eat a diet that is high in calories, protein, and other nutrients to maintain body weight and to keep the lungs as healthy as possible. Depending on the medicines you take and other health conditions you may have, your health care provider may give you additional recommendations on what to eat or avoid. Talk with your health care provider about your goals for body weight, and work with a dietitian to develop an eating plan that is right for you. What are tips for following this plan? Reading food labels  Avoid foods with more than 300 milligrams (mg) of salt (sodium) per serving. Choose foods that contain at least 4 grams (g) of fiber per serving. Try to eat 20-30 g of fiber each day. Choose foods that are high in calories and protein, such as nuts, beans, yogurt, and cheese. Shopping Do not buy foods labeled as diet, low-calorie, or low-fat. If you are able to eat dairy products: Avoid low-fat or skim milk. Buy dairy products that have at least 2% fat. Buy nutritional supplement drinks. Buy grains and prepared foods labeled as enriched or fortified. Consider buying low-sodium, pre-made foods to conserve energy for eating. Cooking Add dry milk or protein powder to smoothies. Cook with healthy fats, such as olive oil, canola oil, sunflower oil, and grapeseed oil. Add oil, butter, cream cheese, or nut butters to foods to increase fat and calories. To make foods easier to chew and swallow: Cook vegetables, pasta, and rice until soft. Cut or grind meat into very small pieces. Dip breads in liquid. Meal planning  Eat when you feel hungry. Eat 5-6 small meals throughout the day. Drink 6-8 glasses of water  each day. Do not drink liquids with meals. Drink liquids at the end of the meal to avoid feeling full too quickly. Eat a variety of fruits and vegetables every day. Ask for assistance from family or friends with planning and preparing meals as needed. Avoid foods that cause you to feel bloated, such as carbonated drinks, fried foods, beans, broccoli, cabbage, and apples. For older adults, ask your local agency on aging whether you are eligible for meal assistance programs, such as Meals on Wheels. Lifestyle  Do not smoke. Eat slowly. Take small bites and chew food well before swallowing. Do not overeat. This may make it more difficult to breathe after eating. Sit up while eating. If needed, continue to use supplemental oxygen while eating. Rest or relax for 30 minutes before and after eating. Monitor your weight as told by your health care provider. Exercise as told by your health care provider. What foods should I eat? Fruits All fresh, dried, canned, or frozen fruits that do not cause gas. Vegetables All fresh, canned (no salt added), or frozen vegetables that do not cause gas. Grains Whole-grain bread. Enriched whole-grain pasta. Fortified whole-grain cereals. Fortified rice. Quinoa. Meats and other proteins Lean meat. Poultry. Fish. Dried beans. Unsalted nuts. Tofu. Eggs. Nut butters. Dairy Whole or 2% milk. Cheese. Yogurt. Fats and oils Olive oil. Canola oil. Butter. Margarine. Beverages Water. Vegetable juice (no salt added). Decaffeinated coffee. Decaffeinated or herbal tea. Seasonings and condiments Fresh or dried herbs. Low-salt or salt-free seasonings. Low-sodium soy sauce. The items listed above may not be a complete list of foods   and beverages you can eat. Contact a dietitian for more information. What foods should I avoid? Fruits Fruits that cause gas, such as apples or melon. Vegetables Vegetables that cause gas, such as broccoli, Brussels sprouts, cabbage,  cauliflower, and onions. Canned vegetables with added salt. Meats and other proteins Fried meat. Salt-cured meat. Processed meat. Dairy Fat-free or low-fat milk, yogurt, or cheese. Processed cheese. Beverages Carbonated drinks. Caffeinated drinks, such as coffee, tea, and soft drinks. Juice. Alcohol. Vegetable juice with added salt. Seasonings and condiments Salt. Seasoning mixes with salt. Soy sauce. Pickles. Other foods Clear soup or broth. Fried foods. Prepared frozen meals. The items listed above may not be a complete list of foods and beverages you should avoid. Contact a dietitian for more information. Summary COPD symptoms can make it difficult to eat enough to maintain a healthy weight. A COPD eating plan can help you maintain your body weight and keep your lungs as healthy as possible. Eat a diet that is high in calories, protein, and other nutrients. Read labels to make sure that you are getting the right nutrients. Cook foods to make them easier to chew and swallow. Eat 5-6 small meals throughout the day, and avoid foods that cause gas or make you feel bloated. This information is not intended to replace advice given to you by your health care provider. Make sure you discuss any questions you have with your health care provider. Document Revised: 01/21/2020 Document Reviewed: 01/21/2020 Elsevier Patient Education  2023 Elsevier Inc.  

## 2022-07-20 ENCOUNTER — Ambulatory Visit (INDEPENDENT_AMBULATORY_CARE_PROVIDER_SITE_OTHER): Payer: Medicare HMO | Admitting: Nurse Practitioner

## 2022-07-20 ENCOUNTER — Encounter: Payer: Self-pay | Admitting: Nurse Practitioner

## 2022-07-20 VITALS — BP 118/77 | HR 85 | Temp 98.1°F | Ht 60.75 in | Wt 121.1 lb

## 2022-07-20 DIAGNOSIS — R801 Persistent proteinuria, unspecified: Secondary | ICD-10-CM | POA: Diagnosis not present

## 2022-07-20 DIAGNOSIS — F331 Major depressive disorder, recurrent, moderate: Secondary | ICD-10-CM | POA: Diagnosis not present

## 2022-07-20 DIAGNOSIS — R7309 Other abnormal glucose: Secondary | ICD-10-CM

## 2022-07-20 DIAGNOSIS — F1410 Cocaine abuse, uncomplicated: Secondary | ICD-10-CM | POA: Diagnosis not present

## 2022-07-20 DIAGNOSIS — E782 Mixed hyperlipidemia: Secondary | ICD-10-CM

## 2022-07-20 DIAGNOSIS — F5104 Psychophysiologic insomnia: Secondary | ICD-10-CM

## 2022-07-20 DIAGNOSIS — D692 Other nonthrombocytopenic purpura: Secondary | ICD-10-CM

## 2022-07-20 DIAGNOSIS — B182 Chronic viral hepatitis C: Secondary | ICD-10-CM

## 2022-07-20 DIAGNOSIS — J410 Simple chronic bronchitis: Secondary | ICD-10-CM

## 2022-07-20 DIAGNOSIS — E559 Vitamin D deficiency, unspecified: Secondary | ICD-10-CM

## 2022-07-20 DIAGNOSIS — F1010 Alcohol abuse, uncomplicated: Secondary | ICD-10-CM | POA: Diagnosis not present

## 2022-07-20 DIAGNOSIS — I10 Essential (primary) hypertension: Secondary | ICD-10-CM

## 2022-07-20 DIAGNOSIS — F17219 Nicotine dependence, cigarettes, with unspecified nicotine-induced disorders: Secondary | ICD-10-CM

## 2022-07-20 DIAGNOSIS — E039 Hypothyroidism, unspecified: Secondary | ICD-10-CM | POA: Diagnosis not present

## 2022-07-20 DIAGNOSIS — Z0000 Encounter for general adult medical examination without abnormal findings: Secondary | ICD-10-CM | POA: Diagnosis not present

## 2022-07-20 DIAGNOSIS — N1832 Chronic kidney disease, stage 3b: Secondary | ICD-10-CM

## 2022-07-20 LAB — MICROALBUMIN, URINE WAIVED
Creatinine, Urine Waived: 100 mg/dL (ref 10–300)
Microalb, Ur Waived: 80 mg/L — ABNORMAL HIGH (ref 0–19)

## 2022-07-20 MED ORDER — LEVOTHYROXINE SODIUM 50 MCG PO TABS: 50.0000 ug | ORAL_TABLET | Freq: Every day | ORAL | 4 refills | Status: DC

## 2022-07-20 MED ORDER — BUPROPION HCL ER (XL) 150 MG PO TB24: ORAL_TABLET | ORAL | 4 refills | Status: DC

## 2022-07-20 MED ORDER — GABAPENTIN 300 MG PO CAPS: 300.0000 mg | ORAL_CAPSULE | Freq: Three times a day (TID) | ORAL | 4 refills | Status: DC

## 2022-07-20 MED ORDER — ALBUTEROL SULFATE HFA 108 (90 BASE) MCG/ACT IN AERS: INHALATION_SPRAY | RESPIRATORY_TRACT | 5 refills | Status: DC

## 2022-07-20 MED ORDER — TRAZODONE HCL 50 MG PO TABS: 50.0000 mg | ORAL_TABLET | Freq: Every day | ORAL | 4 refills | Status: DC

## 2022-07-20 MED ORDER — AMOXICILLIN-POT CLAVULANATE 875-125 MG PO TABS: 1.0000 | ORAL_TABLET | Freq: Two times a day (BID) | ORAL | 0 refills | Status: AC

## 2022-07-20 MED ORDER — ATORVASTATIN CALCIUM 20 MG PO TABS: 20.0000 mg | ORAL_TABLET | Freq: Every day | ORAL | 4 refills | Status: DC

## 2022-07-20 MED ORDER — PREDNISONE 20 MG PO TABS: 40.0000 mg | ORAL_TABLET | Freq: Every day | ORAL | 0 refills | Status: AC

## 2022-07-20 MED ORDER — HYDROXYZINE HCL 10 MG PO TABS: 10.0000 mg | ORAL_TABLET | Freq: Two times a day (BID) | ORAL | 4 refills | Status: DC | PRN

## 2022-07-20 MED ORDER — CHOLECALCIFEROL 1.25 MG (50000 UT) PO CAPS: 50000.0000 [IU] | ORAL_CAPSULE | ORAL | 4 refills | Status: DC

## 2022-07-20 MED ORDER — TELMISARTAN 20 MG PO TABS: 20.0000 mg | ORAL_TABLET | Freq: Every day | ORAL | 4 refills | Status: DC

## 2022-07-20 NOTE — Assessment & Plan Note (Signed)
Repeat Hep C antibody at next visit and consider GI referral for further work-up, at this time refuses both lab for this or referral.  Recommended no ETOH or cocaine use. ?

## 2022-07-20 NOTE — Assessment & Plan Note (Signed)
I have recommended complete cessation of tobacco use. She continues on Wellbutrin, which has offered benefit.  Refuses CT lung screening.  

## 2022-07-20 NOTE — Assessment & Plan Note (Addendum)
Chronic, ongoing with exacerbation.  Continue Stiolto and Albuterol.  Start Augmentin and Prednisone for exacerbation.  Recommend complete smoking cessation, continue Wellbutrin which has helped with reduction.  Refuses lung CT CA screening at this time, but provided information on this.  She does not wish to pursue pulmonary referral at this time, but may benefit from in future.  Reports would not pursue treatment if cancer presented.  Continue to collaborate with CCM team for assistance.  Spirometry next visit.  Return in 6 months for follow-up.

## 2022-07-20 NOTE — Assessment & Plan Note (Signed)
Chronic.  Noted on past labs, recheck today  Continue Telmisartan for kidney protection. Refuses to return to nephrology at this time.

## 2022-07-20 NOTE — Assessment & Plan Note (Signed)
Chronic, stable.  BP below goal today.  Will continue Telmisartan 20 MG daily at this time due to tighter control with higher doses.  This is offering benefit to proteinuria.  Recommend checking BP at home 3 mornings a week and documenting for provider + DASH diet.  LABS: CBC, CMP, TSH, urine ALB (30 on check last visit).  Recommend complete cessation of cocaine use and smoking.  Return in 6 months for follow-up.

## 2022-07-20 NOTE — Assessment & Plan Note (Signed)
Recommend complete cessation of alcohol use. 

## 2022-07-20 NOTE — Assessment & Plan Note (Signed)
Chronic, improved on Trazodone.  Continue current medication regimen, refills sent.   

## 2022-07-20 NOTE — Assessment & Plan Note (Signed)
Chronic.  Continue supplement and recheck Vit D level today.  May consider switch from weekly to daily dosing if improved.

## 2022-07-20 NOTE — Assessment & Plan Note (Signed)
Ongoing, but improved with Wellbutrin on board.  Denies SI/HI.  She has reduced cocaine and alcohol use + smoking.  Continue current medication regimen and adjust as needed.  Refills sent in.  To alert provider or go immediately to ER if SI/HI presents.  Return in 6 months. ?

## 2022-07-20 NOTE — Assessment & Plan Note (Signed)
Chronic, ongoing.  Continue current medication regimen and adjust as needed. Lipid panel today. 

## 2022-07-20 NOTE — Progress Notes (Signed)
BP 118/77   Pulse 85   Temp 98.1 F (36.7 C) (Oral)   Ht 5' 0.75" (1.543 m)   Wt 121 lb 1.6 oz (54.9 kg)   SpO2 98%   BMI 23.07 kg/m    Subjective:    Patient ID: Kristen Mills, female    DOB: 10-11-1950, 72 y.o.   MRN: [id number]  HPI: Kristen Mills is a 72 y.o. female presenting on 07/20/2022 for comprehensive medical examination. Current medical complaints include: sinus pressure  She currently lives with: lives by self Menopausal Symptoms: no   COPD Continues on Stiolto via assistance program and Albuterol as needed.  Reports started with cough and sinus pressure one week ago, some increased wheezing. COPD status: stable Satisfied with current treatment?: yes Oxygen use: no Dyspnea frequency: yes, at present due to illness Cough frequency: a little bit more with illness Rescue inhaler frequency:   Limitation of activity: no Productive cough: yes Last Spirometry: 11/12/18 Pneumovax: Up to Date Influenza: Up to Date   HYPERTENSION / HYPERLIPIDEMIA Continues on Telmisartan 20 MG daily and Atorvastatin 20 MG daily.  A1c in May 2023 5.6% May, denies any symptoms.  Continues to smoke, but is smoking less then 1 PPD.  Has smoked for many years -- continues Wellbutrin.  Refuses lung cancer screening, have discussed with her multiple times -- she reports she would not get treatment if cancer presented.  Does not get any preventative screening. Satisfied with current treatment? yes Duration of hypertension: chronic BP monitoring frequency: not checking BP range:  BP medication side effects: no Duration of hyperlipidemia: chronic Cholesterol supplements: none Aspirin: no Recent stressors: no Recurrent headaches: no Visual changes: no Palpitations: no Dyspnea: no Chest pain: no Lower extremity edema: no Dizzy/lightheaded: no  The 10-year ASCVD risk score (Arnett DK, et al., 2019) is: 28.4%   Values used to calculate the score:     Age: 23 years     Sex: Female      Is Non-Hispanic African American: No     Diabetic: Yes     Tobacco smoker: Yes     Systolic Blood Pressure: 118 mmHg     Is BP treated: Yes     HDL Cholesterol: 44 mg/dL     Total Cholesterol: 130 mg/dL  CHRONIC KIDNEY DISEASE Has not returned to nephrology since 01/14/21, states she was not going to do what they said.   Has been drinking more water, cut back on alcohol -- none, used to get a beer on pay day, but no longer does this.  Trying to cut back on sodas.   CKD status: stable Medications renally dose: yes Previous renal evaluation: no Pneumovax:  Up to Date Influenza Vaccine:  Up to Date     Latest Ref Rng & Units 08/02/2021    1:50 PM 10/20/2020    2:21 PM 09/02/2020    3:15 PM  BMP  Glucose 70 - 99 mg/dL 409  811  914   BUN 8 - 27 mg/dL 17  12  23    Creatinine 0.57 - 1.00 mg/dL 7.82  9.56  2.13   BUN/Creat Ratio 12 - 28 11  9  13    Sodium 134 - 144 mmol/L 138  139  136   Potassium 3.5 - 5.2 mmol/L 4.5  5.0  4.8   Chloride 96 - 106 mmol/L 103  100  100   CO2 20 - 29 mmol/L 22  23  18    Calcium 8.7 -  10.3 mg/dL 9.7  62.9  9.7      HYPOTHYROIDISM Continues on Levothyroxine 50 MCG.  Taking Vitamin D wee for history of low levels.  Thyroid control status:stable Satisfied with current treatment? yes Medication side effects: no Medication compliance: good compliance Etiology of hypothyroidism:  Recent dose adjustment:no Fatigue: no Cold intolerance: no Heat intolerance: no Weight gain: no Weight loss: no Constipation: no Diarrhea/loose stools: no Palpitations: no Lower extremity edema: no Anxiety/depressed mood: no    DEPRESSION Taking Wellbutrin for mood and Trazodone for sleep + Gabapentin which she uses for sleep and chronic back pain.  Continues to use cocaine, last used a couple days -- currently using every 2 days as is out of Wellbutrin, could not afford until pay day.  States she uses it due to making her feel good, but had been using less due to Wellbutrin.   Refuses rehab, therapy, or psychiatry referral.  Underlying Chronic Hep C, has not seen GI in some time, had treatment. Mood status: exacerbated Satisfied with current treatment?: yes Symptom severity: moderate  Duration of current treatment : chronic Side effects: no Medication compliance: good compliance Psychotherapy/counseling: none Depressed mood: yes Anxious mood: yes Anhedonia: no Significant weight loss or gain: no Insomnia: none Fatigue: no Feelings of worthlessness or guilt: no Impaired concentration/indecisiveness: yes Suicidal ideations: no Hopelessness: no Crying spells: no    07/20/2022   11:08 AM 12/22/2021   12:33 PM 08/02/2021    1:47 PM 01/20/2021    4:29 PM 12/18/2020   11:28 AM  Depression screen PHQ 2/9  Decreased Interest 1 1 1 1 1   Down, Depressed, Hopeless 1 0 0 0 0  PHQ - 2 Score 2 1 1 1 1   Altered sleeping 0 0 0 1 0  Tired, decreased energy 1 0 1 2 3   Change in appetite 0 0 0 2 1  Feeling bad or failure about yourself  0 0 0 0 0  Trouble concentrating 0 0 0 0 0  Moving slowly or fidgety/restless 0 0 0 0 0  Suicidal thoughts 0 0 0 0 0  PHQ-9 Score 3 1 2 6 5   Difficult doing work/chores Not difficult at all Not difficult at all Not difficult at all Not difficult at all Not difficult at all      07/20/2022   11:08 AM 08/02/2021    1:47 PM 01/20/2021    4:30 PM 08/15/2018    2:24 PM  GAD 7 : Generalized Anxiety Score  Nervous, Anxious, on Edge 1 0 1 2  Control/stop worrying 0 0 1 3  Worry too much - different things 0 0 1 2  Trouble relaxing 1 0 1 2  Restless 0 0 0 1  Easily annoyed or irritable 0 0 0 0  Afraid - awful might happen 0 0 0 0  Total GAD 7 Score 2 0 4 10  Anxiety Difficulty Not difficult at all Somewhat difficult Not difficult at all Somewhat difficult      12/18/2020    4:34 PM 08/02/2021    1:48 PM 12/22/2021   12:27 PM 07/20/2022   11:07 AM 07/20/2022   11:28 AM  Fall Risk  Falls in the past year? 0 0 0 0 0  Was there an injury  with Fall? 0 0 0 0 0  Fall Risk Category Calculator 0 0 0 0 0  Fall Risk Category (Retired) Low Low Low    (RETIRED) Patient Fall Risk Level Low fall risk Low fall  risk Low fall risk    Patient at Risk for Falls Due to No Fall Risks No Fall Risks  No Fall Risks No Fall Risks  Fall risk Follow up Falls evaluation completed Falls evaluation completed Falls evaluation completed;Education provided;Falls prevention discussed Falls evaluation completed Education provided    Functional Status Survey: Is the patient deaf or have difficulty hearing?: No Does the patient have difficulty seeing, even when wearing glasses/contacts?: No Does the patient have difficulty concentrating, remembering, or making decisions?: No Does the patient have difficulty walking or climbing stairs?: No Does the patient have difficulty dressing or bathing?: No Does the patient have difficulty doing errands alone such as visiting a doctor's office or shopping?: No   Past Medical History:  Past Medical History:  Diagnosis Date   Asthma    Cancer    Uterine Cancer   COPD (chronic obstructive pulmonary disease)    Depression    Diabetes mellitus without complication    Hyperlipidemia    Hypertension    Insomnia    Polysubstance abuse    Spinal stenosis    Thyroid condition     Surgical History:  Past Surgical History:  Procedure Laterality Date   ABDOMINAL HYSTERECTOMY      Medications:  Current Outpatient Medications on File Prior to Visit  Medication Sig   STIOLTO RESPIMAT 2.5-2.5 MCG/ACT AERS INHALE 2 PUFFS INTO THE LUNGS DAILY   No current facility-administered medications on file prior to visit.    Allergies:  No Known Allergies  Social History:  Social History   Socioeconomic History   Marital status: Single    Spouse name: Not on file   Number of children: Not on file   Years of education: Not on file   Highest education level: Not on file  Occupational History   Not on file  Tobacco  Use   Smoking status: Every Day    Packs/day: 1.00    Years: 45.00    Additional pack years: 0.00    Total pack years: 45.00    Types: Cigarettes   Smokeless tobacco: Never  Vaping Use   Vaping Use: Former  Substance and Sexual Activity   Alcohol use: Yes    Alcohol/week: 3.0 standard drinks of alcohol    Types: 3 Cans of beer per week    Comment: Few beers daily.   Drug use: Yes    Types: "Crack" cocaine, Marijuana, Cocaine    Comment: currently cocaine   Sexual activity: Not Currently  Other Topics Concern   Not on file  Social History Narrative   Not on file   Social Determinants of Health   Financial Resource Strain: High Risk (12/22/2021)   Overall Financial Resource Strain (CARDIA)    Difficulty of Paying Living Expenses: Hard  Food Insecurity: Food Insecurity Present (12/22/2021)   Hunger Vital Sign    Worried About Running Out of Food in the Last Year: Often true    Ran Out of Food in the Last Year: Often true  Transportation Needs: No Transportation Needs (12/22/2021)   PRAPARE - Administrator, Civil Service (Medical): No    Lack of Transportation (Non-Medical): No  Physical Activity: Inactive (12/22/2021)   Exercise Vital Sign    Days of Exercise per Week: 0 days    Minutes of Exercise per Session: 0 min  Stress: Stress Concern Present (12/22/2021)   [employer] of Occupational Health - Occupational Stress Questionnaire    Feeling of Stress : To some  extent  Social Connections: Unknown (12/22/2021)   Social Connection and Isolation Panel [NHANES]    Frequency of Communication with Friends and Family: More than three times a week    Frequency of Social Gatherings with Friends and Family: Twice a week    Attends Religious Services: Never    Database administrator or Organizations: No    Attends Banker Meetings: Never    Marital Status: Not on file  Intimate Partner Violence: Not At Risk (12/22/2021)   Humiliation, Afraid, Rape, and  Kick questionnaire    Fear of Current or Ex-Partner: No    Emotionally Abused: No    Physically Abused: No    Sexually Abused: No   Social History   Tobacco Use  Smoking Status Every Day   Packs/day: 1.00   Years: 45.00   Additional pack years: 0.00   Total pack years: 45.00   Types: Cigarettes  Smokeless Tobacco Never   Social History   Substance and Sexual Activity  Alcohol Use Yes   Alcohol/week: 3.0 standard drinks of alcohol   Types: 3 Cans of beer per week   Comment: Few beers daily.    Family History:  Family History  Problem Relation Age of Onset   Cancer Mother        Lung   Cancer Father        Lung   Heart disease Sister    Heart attack Sister    Cancer Brother        Lung   Bipolar disorder Son     Past medical history, surgical history, medications, allergies, family history and social history reviewed with patient today and changes made to appropriate areas of the chart.   ROS All other ROS negative except what is listed above and in the HPI.      Objective:    BP 118/77   Pulse 85   Temp 98.1 F (36.7 C) (Oral)   Ht 5' 0.75" (1.543 m)   Wt 121 lb 1.6 oz (54.9 kg)   SpO2 98%   BMI 23.07 kg/m   Wt Readings from Last 3 Encounters:  07/20/22 121 lb 1.6 oz (54.9 kg)  08/02/21 139 lb 12.8 oz (63.4 kg)  01/20/21 147 lb 6.4 oz (66.9 kg)    Physical Exam Vitals and nursing note reviewed.  Constitutional:      General: She is awake. She is not in acute distress.    Appearance: She is well-developed. She is not ill-appearing.  HENT:     Head: Normocephalic and atraumatic.     Right Ear: Hearing, tympanic membrane, ear canal and external ear normal. No drainage.     Left Ear: Hearing, tympanic membrane, ear canal and external ear normal. No drainage.     Nose: Nose normal.     Right Sinus: No maxillary sinus tenderness or frontal sinus tenderness.     Left Sinus: No maxillary sinus tenderness or frontal sinus tenderness.     Mouth/Throat:      Mouth: Mucous membranes are moist.     Pharynx: Oropharynx is clear. Uvula midline. No pharyngeal swelling, oropharyngeal exudate or posterior oropharyngeal erythema.  Eyes:     General: Lids are normal.        Right eye: No discharge.        Left eye: No discharge.     Extraocular Movements: Extraocular movements intact.     Conjunctiva/sclera: Conjunctivae normal.     Pupils: Pupils are equal, round,  and reactive to light.     Visual Fields: Right eye visual fields normal and left eye visual fields normal.  Neck:     Thyroid: No thyromegaly.     Vascular: No carotid bruit.     Trachea: Trachea normal.  Cardiovascular:     Rate and Rhythm: Normal rate and regular rhythm.     Heart sounds: Normal heart sounds. No murmur heard.    No gallop.  Pulmonary:     Effort: Pulmonary effort is normal. No accessory muscle usage or respiratory distress.     Breath sounds: Wheezing present.     Comments: Scattered expiratory wheezes throughout per baseline. Chest:     Comments: Deferred per patient request. Abdominal:     General: Bowel sounds are normal.     Palpations: Abdomen is soft. There is no hepatomegaly or splenomegaly.     Tenderness: There is no abdominal tenderness.  Musculoskeletal:        General: Normal range of motion.     Cervical back: Normal range of motion and neck supple.     Right lower leg: No edema.     Left lower leg: No edema.  Lymphadenopathy:     Head:     Right side of head: No submental, submandibular, tonsillar, preauricular or posterior auricular adenopathy.     Left side of head: No submental, submandibular, tonsillar, preauricular or posterior auricular adenopathy.     Cervical: No cervical adenopathy.  Skin:    General: Skin is warm and dry.     Capillary Refill: Capillary refill takes less than 2 seconds.     Findings: No rash.     Comments: Scattered pale purple bruises bilateral upper and lower extremities.  Neurological:     Mental Status:  She is alert and oriented to person, place, and time.     Gait: Gait is intact.     Deep Tendon Reflexes: Reflexes are normal and symmetric.     Reflex Scores:      Brachioradialis reflexes are 2+ on the right side and 2+ on the left side.      Patellar reflexes are 2+ on the right side and 2+ on the left side. Psychiatric:        Attention and Perception: Attention normal.        Mood and Affect: Mood normal.        Speech: Speech normal.        Behavior: Behavior normal. Behavior is cooperative.        Thought Content: Thought content normal.        Judgment: Judgment normal.    Results for orders placed or performed in visit on 09/22/21  Lipid Panel w/o Chol/HDL Ratio  Result Value Ref Range   Cholesterol, Total 130 100 - 199 mg/dL   Triglycerides 161 (H) 0 - 149 mg/dL   HDL 44 >09 mg/dL   VLDL Cholesterol Cal 30 5 - 40 mg/dL   LDL Chol Calc (NIH) 56 0 - 99 mg/dL  TSH  Result Value Ref Range   TSH 0.887 0.450 - 4.500 uIU/mL  T4, free  Result Value Ref Range   Free T4 1.24 0.82 - 1.77 ng/dL      Assessment & Plan:   Problem List Items Addressed This Visit       Cardiovascular and Mediastinum   Hypertension    Chronic, stable.  BP below goal today.  Will continue Telmisartan 20 MG daily at this time due to  tighter control with higher doses.  This is offering benefit to proteinuria.  Recommend checking BP at home 3 mornings a week and documenting for provider + DASH diet.  LABS: CBC, CMP, TSH, urine ALB (30 on check last visit).  Recommend complete cessation of cocaine use and smoking.  Return in 6 months for follow-up.       Relevant Medications   telmisartan (MICARDIS) 20 MG tablet   atorvastatin (LIPITOR) 20 MG tablet   Other Relevant Orders   Comprehensive metabolic panel     Respiratory   Simple chronic bronchitis - Primary    Chronic, ongoing with exacerbation.  Continue Stiolto and Albuterol.  Start Augmentin and Prednisone for exacerbation.  Recommend  complete smoking cessation, continue Wellbutrin which has helped with reduction.  Refuses lung CT CA screening at this time, but provided information on this.  She does not wish to pursue pulmonary referral at this time, but may benefit from in future.  Reports would not pursue treatment if cancer presented.  Continue to collaborate with CCM team for assistance.  Spirometry next visit.  Return in 6 months for follow-up.       Relevant Orders   CBC with Differential/Platelet     Digestive   Chronic hepatitis C without hepatic coma    Repeat Hep C antibody at next visit and consider GI referral for further work-up, at this time refuses both lab for this or referral.  Recommended no ETOH or cocaine use.      Relevant Orders   Comprehensive metabolic panel     Endocrine   Hypothyroid    Ongoing -- Started medication on 09/03/20.  Taking Levothyroxine daily, continue this and adjust as needed.  Labs today.      Relevant Medications   levothyroxine (SYNTHROID) 50 MCG tablet   Other Relevant Orders   TSH   T4, free     Nervous and Auditory   Nicotine dependence, cigarettes, w unsp disorders    I have recommended complete cessation of tobacco use. She continues on Wellbutrin, which has offered benefit.  Refuses CT lung screening.         Musculoskeletal and Integument   Other nonthrombocytopenic purpura    Chronic.  Noted on exam, recommend gentle skin care and notify provider if wounds present.      Relevant Orders   CBC with Differential/Platelet     Genitourinary   CKD (chronic kidney disease) stage 3, GFR 30-59 ml/min    Ongoing, followed by nephrology in past, last labs and notes reviewed.  Continue this collaboration.  CrCl 31.49 (will recalculate new labs) -- maintain current Gabapentin dosing but decrease if drops <30.  Refuses to return to nephrology at this time.      Relevant Orders   Comprehensive metabolic panel   Microalbumin, Urine Waived     Other   Cocaine  abuse    Had been cutting back on use with Wellbutrin on board, however has been out of this and needs to get refills.  Praised for reduction of use and recommend complete cessation.  BP trended down with less use, discussed with her, as is using more cocaine recently.  Have discussed at length high risk for cardiac death with continued cocaine use.      Depression, major, recurrent, moderate    Ongoing, but improved with Wellbutrin on board.  Denies SI/HI.  She has reduced cocaine and alcohol use + smoking.  Continue current medication regimen and adjust as needed.  Refills sent in.  To alert provider or go immediately to ER if SI/HI presents.  Return in 6 months.      Relevant Medications   buPROPion (WELLBUTRIN XL) 150 MG 24 hr tablet   traZODone (DESYREL) 50 MG tablet   hydrOXYzine (ATARAX) 10 MG tablet   Elevated hemoglobin A1c    A1c 5.6% last visit, has lost some weight since last visit.  Recommend continued focus on diet and regular activity.      Relevant Orders   HgB A1c   ETOH abuse    Recommend complete cessation of alcohol use.      Relevant Orders   Comprehensive metabolic panel   Hyperlipemia    Chronic, ongoing.  Continue current medication regimen and adjust as needed.  Lipid panel today.      Relevant Medications   telmisartan (MICARDIS) 20 MG tablet   atorvastatin (LIPITOR) 20 MG tablet   Other Relevant Orders   Comprehensive metabolic panel   Lipid Panel w/o Chol/HDL Ratio   Insomnia    Chronic, improved on Trazodone.  Continue current medication regimen, refills sent.        Proteinuria, unspecified    Chronic.  Noted on past labs, recheck today  Continue Telmisartan for kidney protection. Refuses to return to nephrology at this time.      Vitamin D deficiency    Chronic.  Continue supplement and recheck Vit D level today.  May consider switch from weekly to daily dosing if improved.      Relevant Orders   VITAMIN D 25 Hydroxy (Vit-D Deficiency,  Fractures)   Other Visit Diagnoses     Encounter for annual physical exam       Annual physical today with labs and health maintenance reviewed, discussed with patient. Refuses all vaccines and screenings.        Follow up plan: Return in about 6 months (around 01/19/2023) for HTN/HLD, COPD, MOOD, THYROID.   LABORATORY TESTING:  - Pap smear: not applicable  IMMUNIZATIONS:   - Tdap: Tetanus vaccination status reviewed: last tetanus booster within 10 years. - Influenza: Up to date - Pneumovax: Up to date - Prevnar: Up to date - HPV: Not applicable - Zostavax vaccine: Refused  SCREENING: -Mammogram: Refused  - Colonoscopy: Refused  - Bone Density: Refused  -Hearing Test: Not applicable  -Spirometry: Refused   PATIENT COUNSELING:   Advised to take 1 mg of folate supplement per day if capable of pregnancy.   Sexuality: Discussed sexually transmitted diseases, partner selection, use of condoms, avoidance of unintended pregnancy  and contraceptive alternatives.   Advised to avoid cigarette smoking.  I discussed with the patient that most people either abstain from alcohol or drink within safe limits (<=14/week and <=4 drinks/occasion for males, <=7/weeks and <= 3 drinks/occasion for females) and that the risk for alcohol disorders and other health effects rises proportionally with the number of drinks per week and how often a drinker exceeds daily limits.  Discussed cessation/primary prevention of drug use and availability of treatment for abuse.   Diet: Encouraged to adjust caloric intake to maintain  or achieve ideal body weight, to reduce intake of dietary saturated fat and total fat, to limit sodium intake by avoiding high sodium foods and not adding table salt, and to maintain adequate dietary potassium and calcium preferably from fresh fruits, vegetables, and low-fat dairy products.    Stressed the importance of regular exercise  Injury prevention: Discussed safety  belts, safety helmets, smoke detector, smoking  near bedding or upholstery.   Dental health: Discussed importance of regular tooth brushing, flossing, and dental visits.    NEXT PREVENTATIVE PHYSICAL DUE IN 1 YEAR. Return in about 6 months (around 01/19/2023) for HTN/HLD, COPD, MOOD, THYROID.

## 2022-07-20 NOTE — Assessment & Plan Note (Addendum)
Had been cutting back on use with Wellbutrin on board, however has been out of this and needs to get refills.  Praised for reduction of use and recommend complete cessation.  BP trended down with less use, discussed with her, as is using more cocaine recently.  Have discussed at length high risk for cardiac death with continued cocaine use.

## 2022-07-20 NOTE — Assessment & Plan Note (Signed)
A1c 5.6% last visit, has lost some weight since last visit.  Recommend continued focus on diet and regular activity.

## 2022-07-20 NOTE — Assessment & Plan Note (Addendum)
Ongoing, followed by nephrology in past, last labs and notes reviewed.  Continue this collaboration.  CrCl 31.49 (will recalculate new labs) -- maintain current Gabapentin dosing but decrease if drops <30.  Refuses to return to nephrology at this time.

## 2022-07-20 NOTE — Assessment & Plan Note (Signed)
Chronic.  Noted on exam, recommend gentle skin care and notify provider if wounds present.

## 2022-07-20 NOTE — Assessment & Plan Note (Addendum)
Ongoing -- Started medication on 09/03/20.  Taking Levothyroxine daily, continue this and adjust as needed.  Labs today.

## 2022-07-21 LAB — LIPID PANEL W/O CHOL/HDL RATIO
Cholesterol, Total: 123 mg/dL (ref 100–199)
HDL: 50 mg/dL (ref >39)
LDL Chol Calc (NIH): 48 mg/dL (ref 0–99)
Triglycerides: 147 mg/dL (ref 0–149)
VLDL Cholesterol Cal: 25 mg/dL (ref 5–40)

## 2022-07-21 LAB — CBC WITH DIFFERENTIAL/PLATELET
Basophils Absolute: 0.1 10*3/uL (ref 0.0–0.2)
Basos: 1 %
EOS (ABSOLUTE): 0.3 10*3/uL (ref 0.0–0.4)
Eos: 2 %
Hematocrit: 42.6 % (ref 34.0–46.6)
Hemoglobin: 13.9 g/dL (ref 11.1–15.9)
Immature Grans (Abs): 0 10*3/uL (ref 0.0–0.1)
Immature Granulocytes: 0 %
Lymphocytes Absolute: 3.9 10*3/uL — ABNORMAL HIGH (ref 0.7–3.1)
Lymphs: 31 %
MCH: 29.3 pg (ref 26.6–33.0)
MCHC: 32.6 g/dL (ref 31.5–35.7)
MCV: 90 fL (ref 79–97)
Monocytes Absolute: 1.4 10*3/uL — ABNORMAL HIGH (ref 0.1–0.9)
Monocytes: 11 %
Neutrophils Absolute: 6.8 10*3/uL (ref 1.4–7.0)
Neutrophils: 55 %
Platelets: 252 10*3/uL (ref 150–450)
RBC: 4.74 x10E6/uL (ref 3.77–5.28)
RDW: 13.1 % (ref 11.7–15.4)
WBC: 12.5 10*3/uL — ABNORMAL HIGH (ref 3.4–10.8)

## 2022-07-21 LAB — T4, FREE: Free T4: 1.32 ng/dL (ref 0.82–1.77)

## 2022-07-21 LAB — COMPREHENSIVE METABOLIC PANEL
ALT: 14 IU/L (ref 0–32)
AST: 24 IU/L (ref 0–40)
Albumin/Globulin Ratio: 1.9 (ref 1.2–2.2)
Albumin: 4.2 g/dL (ref 3.9–4.9)
Alkaline Phosphatase: 92 IU/L (ref 44–121)
BUN/Creatinine Ratio: 18 (ref 12–28)
BUN: 25 mg/dL (ref 8–27)
Bilirubin Total: 0.3 mg/dL (ref 0.0–1.2)
CO2: 17 mmol/L — ABNORMAL LOW (ref 20–29)
Calcium: 9.2 mg/dL (ref 8.7–10.3)
Chloride: 106 mmol/L (ref 96–106)
Creatinine, Ser: 1.36 mg/dL — ABNORMAL HIGH (ref 0.57–1.00)
Globulin, Total: 2.2 g/dL (ref 1.5–4.5)
Glucose: 106 mg/dL — ABNORMAL HIGH (ref 70–99)
Potassium: 4.6 mmol/L (ref 3.5–5.2)
Sodium: 141 mmol/L (ref 134–144)
Total Protein: 6.4 g/dL (ref 6.0–8.5)
eGFR: 42 mL/min/{1.73_m2} — ABNORMAL LOW (ref >59)

## 2022-07-21 LAB — HEMOGLOBIN A1C
Est. average glucose Bld gHb Est-mCnc: 126 mg/dL
Hgb A1c MFr Bld: 6 % — ABNORMAL HIGH (ref 4.8–5.6)

## 2022-07-21 LAB — VITAMIN D 25 HYDROXY (VIT D DEFICIENCY, FRACTURES): Vit D, 25-Hydroxy: 38.1 ng/mL (ref 30.0–100.0)

## 2022-07-21 LAB — TSH: TSH: 1.28 u[IU]/mL (ref 0.450–4.500)

## 2022-07-21 NOTE — Progress Notes (Signed)
Good morning, please let Kristen Mills know her labs have returned: - CBC shows mild elevation in white blood cell count and lymphocytes, which could be related to you being sick.  I would like you to return for lab only visit in 4 weeks to recheck this, please schedule with my staff. - Kidney function continues to show stage 3b kidney disease, I do recommend you return to the kidney doctor for a visit so we can continue to monitor closely with them + cut back on cocaine use.  Liver function is normal. - A1c remains in prediabetic range at 6%, continue working on diet and trying to get some regular exercise. - Remainder of labs are stable.  Continue all current medications, no changes needed.  Any questions? Keep being amazing!!  Thank you for allowing me to participate in your care.  I appreciate you. Kindest regards, Bruno Leach

## 2022-08-01 DIAGNOSIS — H2513 Age-related nuclear cataract, bilateral: Secondary | ICD-10-CM | POA: Diagnosis not present

## 2022-08-19 ENCOUNTER — Other Ambulatory Visit: Payer: Medicare HMO

## 2022-08-19 DIAGNOSIS — D72829 Elevated white blood cell count, unspecified: Secondary | ICD-10-CM | POA: Diagnosis not present

## 2022-08-20 LAB — CBC WITH DIFFERENTIAL/PLATELET
Basophils Absolute: 0.1 10*3/uL (ref 0.0–0.2)
Basos: 1 %
EOS (ABSOLUTE): 0.3 10*3/uL (ref 0.0–0.4)
Eos: 4 %
Hematocrit: 43.4 % (ref 34.0–46.6)
Hemoglobin: 14.6 g/dL (ref 11.1–15.9)
Immature Grans (Abs): 0 10*3/uL (ref 0.0–0.1)
Immature Granulocytes: 0 %
Lymphocytes Absolute: 4.3 10*3/uL — ABNORMAL HIGH (ref 0.7–3.1)
Lymphs: 54 %
MCH: 30.4 pg (ref 26.6–33.0)
MCHC: 33.6 g/dL (ref 31.5–35.7)
MCV: 90 fL (ref 79–97)
Monocytes Absolute: 0.8 10*3/uL (ref 0.1–0.9)
Monocytes: 10 %
Neutrophils Absolute: 2.5 10*3/uL (ref 1.4–7.0)
Neutrophils: 31 %
Platelets: 282 10*3/uL (ref 150–450)
RBC: 4.8 x10E6/uL (ref 3.77–5.28)
RDW: 12.9 % (ref 11.7–15.4)
WBC: 8.1 10*3/uL (ref 3.4–10.8)

## 2022-08-21 ENCOUNTER — Other Ambulatory Visit: Payer: Self-pay | Admitting: Nurse Practitioner

## 2022-08-21 DIAGNOSIS — D72820 Lymphocytosis (symptomatic): Secondary | ICD-10-CM

## 2022-08-21 NOTE — Progress Notes (Signed)
Good morning, please let Kristen Mills know her labs have returned.  White blood cell count has trended down to normal range, great news.  Lymphocytes, one of the types of white blood cells, has trended up a little however.  I would like you to return in 2 months for outpatient lab only and if this continues to trend up I may recommend some imaging of the chest.  Any questions?  Please schedule lab only visit for 2 months. Keep being amazing!!  Thank you for allowing me to participate in your care.  I appreciate you. Kindest regards, Johan Creveling

## 2022-10-21 DIAGNOSIS — H18413 Arcus senilis, bilateral: Secondary | ICD-10-CM | POA: Diagnosis not present

## 2022-10-21 DIAGNOSIS — H25043 Posterior subcapsular polar age-related cataract, bilateral: Secondary | ICD-10-CM | POA: Diagnosis not present

## 2022-10-21 DIAGNOSIS — H2512 Age-related nuclear cataract, left eye: Secondary | ICD-10-CM | POA: Diagnosis not present

## 2022-10-21 DIAGNOSIS — H353131 Nonexudative age-related macular degeneration, bilateral, early dry stage: Secondary | ICD-10-CM | POA: Diagnosis not present

## 2022-10-21 DIAGNOSIS — H2513 Age-related nuclear cataract, bilateral: Secondary | ICD-10-CM | POA: Diagnosis not present

## 2022-10-28 ENCOUNTER — Other Ambulatory Visit: Payer: Medicare HMO

## 2022-10-28 DIAGNOSIS — D72820 Lymphocytosis (symptomatic): Secondary | ICD-10-CM | POA: Diagnosis not present

## 2022-10-31 NOTE — Progress Notes (Signed)
Please let patient know CBC continues to improve.  No changes needed.

## 2022-12-21 ENCOUNTER — Telehealth: Payer: Self-pay | Admitting: Nurse Practitioner

## 2022-12-21 DIAGNOSIS — J410 Simple chronic bronchitis: Secondary | ICD-10-CM

## 2022-12-21 NOTE — Telephone Encounter (Signed)
Copied from CRM [id number]. Topic: General - Other >> Dec 21, 2022  9:29 AM Franchot Heidelberg wrote: Reason for CRM: Pt called requesting samples of STIOLTO RESPIMAT 2.5-2.5 MCG/ACT AERS, please advise   Best contact: [phone]

## 2022-12-22 NOTE — Telephone Encounter (Signed)
We are currently out of Stiolto.

## 2022-12-23 ENCOUNTER — Telehealth: Payer: Self-pay

## 2022-12-23 NOTE — Telephone Encounter (Signed)
Called and notified patient of Jolene's message. 2 samples of Breztri signed out for patient to come pick up.

## 2022-12-23 NOTE — Telephone Encounter (Signed)
Called and spoke to patient. She states that she is having trouble getting the inhaler due to having upcoming cataract surgery. Patient is ok with having the referral put in to the pharmacy team.

## 2022-12-23 NOTE — Progress Notes (Signed)
Care Guide Note  12/23/2022 Name: JANIEYA CHINCHILLA MRN: [id number] DOB: 1950/12/26  Referred by: Marjie Skiff, NP Reason for referral : Care Coordination (Outreach to schedule with Pharm d )   CYTHIA BIGBY is a 72 y.o. year old female who is a primary care patient of Cannady, Dorie Rank, NP. ERNA DIOSDADO was referred to the pharmacist for assistance related to HTN and HLD.    Successful contact was made with the patient to discuss pharmacy services including being ready for the pharmacist to call at least 5 minutes before the scheduled appointment time, to have medication bottles and any blood sugar or blood pressure readings ready for review. The patient agreed to meet with the pharmacist via with the pharmacist via telephone visit on (date/time).  01/03/2023   Penne Lash, RMA Care Guide [facility]  [place], [place] [address] Direct Dial: [phone] [email or web address]

## 2022-12-26 DIAGNOSIS — H2512 Age-related nuclear cataract, left eye: Secondary | ICD-10-CM | POA: Diagnosis not present

## 2022-12-27 DIAGNOSIS — H2511 Age-related nuclear cataract, right eye: Secondary | ICD-10-CM | POA: Diagnosis not present

## 2023-01-02 DIAGNOSIS — H2512 Age-related nuclear cataract, left eye: Secondary | ICD-10-CM | POA: Diagnosis not present

## 2023-01-03 ENCOUNTER — Other Ambulatory Visit: Payer: PPO

## 2023-01-03 NOTE — Progress Notes (Signed)
01/03/2023  Patient ID: Kristen Mills, female   DOB: 1951/02/05, 72 y.o.   MRN: [id number]  Patient outreach for scheduled telephone visit to assist with affordability/access in regard to inhaler(s).  Patient requested to reschedule appointment to Monday 10/21, and she has a Breztri sample in the meantime.  Appointment moved to Monday, 10/21 at 2pm.  Lenna Gilford, PharmD, DPLA

## 2023-01-09 DIAGNOSIS — H2511 Age-related nuclear cataract, right eye: Secondary | ICD-10-CM | POA: Diagnosis not present

## 2023-01-15 NOTE — Patient Instructions (Signed)
Eating Plan for Chronic Obstructive Pulmonary Disease Chronic obstructive pulmonary disease (COPD) causes symptoms such as shortness of breath, coughing, and chest discomfort. These symptoms can make it difficult to eat enough to maintain a healthy weight. Generally, people with COPD should eat a diet that is high in calories, protein, and other nutrients to maintain body weight and to keep the lungs as healthy as possible. Depending on the medicines you take and other health conditions you may have, your health care provider may give you additional recommendations on what to eat or avoid. Talk with your health care provider about your goals for body weight, and work with a dietitian to develop an eating plan that is right for you. What are tips for following this plan? Reading food labels  Avoid foods with more than 300 milligrams (mg) of salt (sodium) per serving. Choose foods that contain at least 4 grams (g) of fiber per serving. Try to eat 20-30 g of fiber each day. Choose foods that are high in calories and protein, such as nuts, beans, yogurt, and cheese. Shopping Do not buy foods labeled as diet, low-calorie, or low-fat. If you are able to eat dairy products: Avoid low-fat or skim milk. Buy dairy products that have at least 2% fat. Buy nutritional supplement drinks. Buy grains and prepared foods labeled as enriched or fortified. Consider buying low-sodium, pre-made foods to conserve energy for eating. Cooking Add dry milk or protein powder to smoothies. Cook with healthy fats, such as olive oil, canola oil, sunflower oil, and grapeseed oil. Add oil, butter, cream cheese, or nut butters to foods to increase fat and calories. To make foods easier to chew and swallow: Cook vegetables, pasta, and rice until soft. Cut or grind meat into very small pieces. Dip breads in liquid. Meal planning  Eat when you feel hungry. Eat 5-6 small meals throughout the day. Drink 6-8 glasses of water  each day. Do not drink liquids with meals. Drink liquids at the end of the meal to avoid feeling full too quickly. Eat a variety of fruits and vegetables every day. Ask for assistance from family or friends with planning and preparing meals as needed. Avoid foods that cause you to feel bloated, such as carbonated drinks, fried foods, beans, broccoli, cabbage, and apples. For older adults, ask your local agency on aging whether you are eligible for meal assistance programs, such as Meals on Wheels. Lifestyle  Do not smoke. Eat slowly. Take small bites and chew food well before swallowing. Do not overeat. This may make it more difficult to breathe after eating. Sit up while eating. If needed, continue to use supplemental oxygen while eating. Rest or relax for 30 minutes before and after eating. Monitor your weight as told by your health care provider. Exercise as told by your health care provider. What foods should I eat? Fruits All fresh, dried, canned, or frozen fruits that do not cause gas. Vegetables All fresh, canned (no salt added), or frozen vegetables that do not cause gas. Grains Whole-grain bread. Enriched whole-grain pasta. Fortified whole-grain cereals. Fortified rice. Quinoa. Meats and other proteins Lean meat. Poultry. Fish. Dried beans. Unsalted nuts. Tofu. Eggs. Nut butters. Dairy Whole or 2% milk. Cheese. Yogurt. Fats and oils Olive oil. Canola oil. Butter. Margarine. Beverages Water. Vegetable juice (no salt added). Decaffeinated coffee. Decaffeinated or herbal tea. Seasonings and condiments Fresh or dried herbs. Low-salt or salt-free seasonings. Low-sodium soy sauce. The items listed above may not be a complete list of foods   and beverages you can eat. Contact a dietitian for more information. What foods should I avoid? Fruits Fruits that cause gas, such as apples or melon. Vegetables Vegetables that cause gas, such as broccoli, Brussels sprouts, cabbage,  cauliflower, and onions. Canned vegetables with added salt. Meats and other proteins Fried meat. Salt-cured meat. Processed meat. Dairy Fat-free or low-fat milk, yogurt, or cheese. Processed cheese. Beverages Carbonated drinks. Caffeinated drinks, such as coffee, tea, and soft drinks. Juice. Alcohol. Vegetable juice with added salt. Seasonings and condiments Salt. Seasoning mixes with salt. Soy sauce. Pickles. Other foods Clear soup or broth. Fried foods. Prepared frozen meals. The items listed above may not be a complete list of foods and beverages you should avoid. Contact a dietitian for more information. Summary COPD symptoms can make it difficult to eat enough to maintain a healthy weight. A COPD eating plan can help you maintain your body weight and keep your lungs as healthy as possible. Eat a diet that is high in calories, protein, and other nutrients. Read labels to make sure that you are getting the right nutrients. Cook foods to make them easier to chew and swallow. Eat 5-6 small meals throughout the day, and avoid foods that cause gas or make you feel bloated. This information is not intended to replace advice given to you by your health care provider. Make sure you discuss any questions you have with your health care provider. Document Revised: 01/21/2020 Document Reviewed: 01/21/2020 Elsevier Patient Education  2024 [employer]  

## 2023-01-16 ENCOUNTER — Other Ambulatory Visit: Payer: Self-pay

## 2023-01-16 DIAGNOSIS — H2511 Age-related nuclear cataract, right eye: Secondary | ICD-10-CM | POA: Diagnosis not present

## 2023-01-16 NOTE — Progress Notes (Signed)
01/16/2023  Patient ID: Kristen Mills, female   DOB: 1950-09-03, 72 y.o.   MRN: [id number]  S/O Patient outreach to assist with affordability of maintenance inhaler in regard to referral placed by patient's PCP, Aura Dials, NP  Medication access/adherence -Patient previously using Stiolto but had completely ran out of medication due to cost  -[facility] provided sample of Breztri, and patient states she prefers this over [employer] based on efficacy -Previously enrolled in [employer] Medicare Extra Help, but she was denied this year based on income -Patient is almost out of Breztri sample   A/P  Medication access/adherence -Patient would qualify for [place] PAP for Breztri based on [facility]; will coordinate with medication assistance team to initiate application process -Ms. Knaup sees PCP Friday, so I will see if office has another Markus Daft (or other maintenance inhaler sample) she can get in the meatime  Follow-up:  Will monitor progress of PAP application to keep patient/provider informed  Lenna Gilford, PharmD, DPLA

## 2023-01-20 ENCOUNTER — Ambulatory Visit (INDEPENDENT_AMBULATORY_CARE_PROVIDER_SITE_OTHER): Payer: Medicare HMO | Admitting: Nurse Practitioner

## 2023-01-20 ENCOUNTER — Encounter: Payer: Self-pay | Admitting: Nurse Practitioner

## 2023-01-20 VITALS — BP 102/72 | HR 87 | Temp 97.7°F | Wt 120.6 lb

## 2023-01-20 DIAGNOSIS — E039 Hypothyroidism, unspecified: Secondary | ICD-10-CM

## 2023-01-20 DIAGNOSIS — D692 Other nonthrombocytopenic purpura: Secondary | ICD-10-CM | POA: Diagnosis not present

## 2023-01-20 DIAGNOSIS — E782 Mixed hyperlipidemia: Secondary | ICD-10-CM | POA: Diagnosis not present

## 2023-01-20 DIAGNOSIS — F1410 Cocaine abuse, uncomplicated: Secondary | ICD-10-CM

## 2023-01-20 DIAGNOSIS — I10 Essential (primary) hypertension: Secondary | ICD-10-CM | POA: Diagnosis not present

## 2023-01-20 DIAGNOSIS — R7309 Other abnormal glucose: Secondary | ICD-10-CM | POA: Diagnosis not present

## 2023-01-20 DIAGNOSIS — Z0000 Encounter for general adult medical examination without abnormal findings: Secondary | ICD-10-CM | POA: Diagnosis not present

## 2023-01-20 DIAGNOSIS — F331 Major depressive disorder, recurrent, moderate: Secondary | ICD-10-CM

## 2023-01-20 DIAGNOSIS — N1832 Chronic kidney disease, stage 3b: Secondary | ICD-10-CM

## 2023-01-20 DIAGNOSIS — F17219 Nicotine dependence, cigarettes, with unspecified nicotine-induced disorders: Secondary | ICD-10-CM

## 2023-01-20 DIAGNOSIS — J410 Simple chronic bronchitis: Secondary | ICD-10-CM | POA: Diagnosis not present

## 2023-01-20 DIAGNOSIS — E785 Hyperlipidemia, unspecified: Secondary | ICD-10-CM

## 2023-01-20 DIAGNOSIS — Z23 Encounter for immunization: Secondary | ICD-10-CM

## 2023-01-20 LAB — BAYER DCA HB A1C WAIVED: HB A1C (BAYER DCA - WAIVED): 5.7 % — ABNORMAL HIGH (ref 4.8–5.6)

## 2023-01-20 NOTE — Assessment & Plan Note (Signed)
Chronic and stable.  Followed by nephrology in past, last labs and notes reviewed -- she does not want to return at this time.  CrCl 33 based on recent labs -- maintain current Gabapentin dosing but decrease if drops <30.  Refuses to return to nephrology at this time.  Continue on ARB daily.

## 2023-01-20 NOTE — Assessment & Plan Note (Signed)
Chronic, ongoing.  Continue current medication regimen and adjust as needed. Lipid panel today. 

## 2023-01-20 NOTE — Assessment & Plan Note (Signed)
Chronic, stable.  BP below goal today.  Will continue Telmisartan 20 MG daily at this time due to tighter control with higher doses.  This is offering benefit to proteinuria, urine ALB 80 April 2024.  Recommend checking BP at home 3 mornings a week and documenting for provider + DASH diet.  LABS: CMP.  Recommend complete cessation of cocaine use and smoking.

## 2023-01-20 NOTE — Assessment & Plan Note (Signed)
Chronic.  Noted on exam, recommend gentle skin care and notify provider if wounds present.

## 2023-01-20 NOTE — Assessment & Plan Note (Signed)
Had been cutting back on use with Wellbutrin on board and feeling better, but is still using.  Praised for reduction of use and continue to recommend complete cessation.  BP trended down with less use, discussed with her.  Have discussed at length high risk for cardiac death or stroke with continued cocaine use + risk for cocaine being mixed with something that could cause harm to her.

## 2023-01-20 NOTE — Assessment & Plan Note (Signed)
Chronic, improved with Breztri.  Is working with PharmD on assistance for this.  Will provide her 2 samples today to use at home.  Continue Breztri and Albuterol.  Recommend complete smoking cessation, continue Wellbutrin which has helped with reduction.  Refuses lung CT CA screening at this time, but provided information on this.  She does not wish to pursue pulmonary referral at this time, but may benefit from in future.  Reports would not pursue treatment if cancer presented.  Spirometry next visit.

## 2023-01-20 NOTE — Assessment & Plan Note (Signed)
Ongoing, but improved with Wellbutrin on board.  Denies SI/HI.  She has reduced cocaine and alcohol use with Wellbutrin on board.  Continue current medication regimen and adjust as needed.  Refills sent in as needed.  To alert provider or go immediately to ER if SI/HI presents.

## 2023-01-20 NOTE — Progress Notes (Signed)
BP 102/72 (BP Location: Left Arm, Cuff Size: Normal)   Pulse 87   Temp 97.7 F (36.5 C) (Oral)   Wt 120 lb 9.6 oz (54.7 kg)   SpO2 93%   BMI 22.98 kg/m    Subjective:    Patient ID: Kristen Mills, female    DOB: 1950-08-21, 72 y.o.   MRN: [id number]  HPI: YSA EVOY is a 72 y.o. female presenting on 01/20/2023 for Medicare Wellness and follow-up visit. Current medical complaints include:none  She currently lives with: self Menopausal Symptoms: no  COPD We are changing to [facility] and is working with PharmD on assistance for this.  Continues Albuterol as needed.  States improvement with Breztri, can breath better -- has samples.  Less burning in lungs.    Continues to smoke, is smoking 1 PPD.  Has smoked for many years -- continues Wellbutrin.  Refuses lung cancer screening, have discussed with her multiple times -- she reports she would not get treatment if cancer presented.   COPD status: stable Satisfied with current treatment?: yes Oxygen use: no Dyspnea frequency: no Cough frequency: no Rescue inhaler frequency:  1-2 a month Limitation of activity: no Productive cough: yes Last Spirometry: 11/12/18 Pneumovax: Up to Date Influenza: Up to Date   HYPERTENSION / HYPERLIPIDEMIA Continues on Telmisartan 20 MG daily and Atorvastatin 20 MG daily.  A1c in April 6%, denies any symptoms and has been working on diet and exercise. Has cut back to two beers a month, used to get a beer on pay day, but no longer does this.   Satisfied with current treatment? yes Duration of hypertension: chronic BP monitoring frequency: not checking BP range:  BP medication side effects: no Duration of hyperlipidemia: chronic Cholesterol supplements: none Aspirin: no Recent stressors: no Recurrent headaches: no Visual changes: no Palpitations: no Dyspnea: no Chest pain: no Lower extremity edema: no Dizzy/lightheaded: no  The ASCVD Risk score (Arnett DK, et al., 2019) failed to calculate  for the following reasons:   The valid total cholesterol range is 130 to 320 mg/dL  CHRONIC KIDNEY DISEASE (CKD 3b) No visit with nephrology since 01/14/21, reports she was not going to do what they said -- cut back on things.  Has cut back on soda. CKD status: stable Medications renally dose: yes Previous renal evaluation: no Pneumovax:  Up to Date Influenza Vaccine:  Up to Date   HYPOTHYROIDISM Continues on Levothyroxine 50 MCG.  Taking Vitamin D weekly for history of low levels, however has not been able to get recently.  Thyroid control status:stable Satisfied with current treatment? yes Medication side effects: no Medication compliance: good compliance Etiology of hypothyroidism:  Recent dose adjustment:no Fatigue: no -- improved Cold intolerance: no Heat intolerance: no Weight gain: no Weight loss: no Constipation: no Diarrhea/loose stools: no Palpitations: no Lower extremity edema: no Anxiety/depressed mood: no    DEPRESSION Continues Wellbutrin for mood and Trazodone for sleep + Gabapentin which she uses for sleep and chronic back pain.    Continues to use cocaine, used yesterday, but she is not taking as much. States she uses it due to making her feel good, but with her mood improving she does not need as much. Refuses rehab, therapy, or psychiatry referral.   Mood status: stable Satisfied with current treatment?: yes Symptom severity: mild  Duration of current treatment : chronic Side effects: no Medication compliance: good compliance Psychotherapy/counseling: none Depressed mood: no Anxious mood: no Anhedonia: no Significant weight loss or gain: no  Insomnia: yes hard to fall asleep -- but Trazodone helps Fatigue: no Feelings of worthlessness or guilt: no Impaired concentration/indecisiveness: no Suicidal ideations: no Hopelessness: no Crying spells: no    01/20/2023   11:58 AM 07/20/2022   11:08 AM 12/22/2021   12:33 PM 08/02/2021    1:47 PM 01/20/2021     4:29 PM  Depression screen PHQ 2/9  Decreased Interest 0 1 1 1 1   Down, Depressed, Hopeless 0 1 0 0 0  PHQ - 2 Score 0 2 1 1 1   Altered sleeping 0 0 0 0 1  Tired, decreased energy 0 1 0 1 2  Change in appetite 0 0 0 0 2  Feeling bad or failure about yourself  0 0 0 0 0  Trouble concentrating 0 0 0 0 0  Moving slowly or fidgety/restless 0 0 0 0 0  Suicidal thoughts 0 0 0 0 0  PHQ-9 Score 0 3 1 2 6   Difficult doing work/chores Not difficult at all Not difficult at all Not difficult at all Not difficult at all Not difficult at all      01/20/2023   11:59 AM 07/20/2022   11:08 AM 08/02/2021    1:47 PM 01/20/2021    4:30 PM  GAD 7 : Generalized Anxiety Score  Nervous, Anxious, on Edge 1 1 0 1  Control/stop worrying 0 0 0 1  Worry too much - different things 1 0 0 1  Trouble relaxing 0 1 0 1  Restless 0 0 0 0  Easily annoyed or irritable 0 0 0 0  Afraid - awful might happen 0 0 0 0  Total GAD 7 Score 2 2 0 4  Anxiety Difficulty Not difficult at all Not difficult at all Somewhat difficult Not difficult at all      08/02/2021    1:48 PM 12/22/2021   12:27 PM 07/20/2022   11:07 AM 07/20/2022   11:28 AM 01/20/2023   11:10 AM  Fall Risk  Falls in the past year? 0 0 0 0 0  Was there an injury with Fall? 0 0 0 0 0  Fall Risk Category Calculator 0 0 0 0 0  Fall Risk Category (Retired) Low Low     (RETIRED) Patient Fall Risk Level Low fall risk Low fall risk     Patient at Risk for Falls Due to No Fall Risks  No Fall Risks No Fall Risks No Fall Risks  Fall risk Follow up Falls evaluation completed Falls evaluation completed;Education provided;Falls prevention discussed Falls evaluation completed Education provided Falls evaluation completed    Functional Status Survey: Is the patient deaf or have difficulty hearing?: Yes Does the patient have difficulty seeing, even when wearing glasses/contacts?: Yes Does the patient have difficulty concentrating, remembering, or making decisions?:  No Does the patient have difficulty walking or climbing stairs?: No Does the patient have difficulty dressing or bathing?: No Does the patient have difficulty doing errands alone such as visiting a doctor's office or shopping?: No   Past Medical History:  Past Medical History:  Diagnosis Date   Asthma    Cancer (HCC)    Uterine Cancer   COPD (chronic obstructive pulmonary disease) (HCC)    Depression    Diabetes mellitus without complication (HCC)    Hyperlipidemia    Hypertension    Insomnia    Polysubstance abuse (HCC)    Spinal stenosis    Thyroid condition     Surgical History:  Past Surgical History:  Procedure Laterality Date   ABDOMINAL HYSTERECTOMY     CATARACT EXTRACTION Bilateral    12/26/22 and 01/09/23    Medications:  Current Outpatient Medications on File Prior to Visit  Medication Sig   albuterol (VENTOLIN HFA) 108 (90 Base) MCG/ACT inhaler INHALE 2 PUFFS EVERY 4 TO 6 HOURS AS NEEDED FOR SHORTNESS OF BREATH   atorvastatin (LIPITOR) 20 MG tablet Take 1 tablet (20 mg total) by mouth daily.   Budeson-Glycopyrrol-Formoterol (BREZTRI AEROSPHERE) 160-9-4.8 MCG/ACT AERO Inhale 2 puffs into the lungs in the morning and at bedtime.   buPROPion (WELLBUTRIN XL) 150 MG 24 hr tablet TAKE 2 TABLETS (300 MG TOTAL) BY MOUTH ONCE DAILY.   Cholecalciferol 1.25 MG (50000 UT) capsule Take 1 capsule (50,000 Units total) by mouth once a week.   gabapentin (NEURONTIN) 300 MG capsule Take 1 capsule (300 mg total) by mouth 3 (three) times daily.   hydrOXYzine (ATARAX) 10 MG tablet Take 1 tablet (10 mg total) by mouth 2 (two) times daily as needed.   levothyroxine (SYNTHROID) 50 MCG tablet Take 1 tablet (50 mcg total) by mouth daily.   moxifloxacin (VIGAMOX) 0.5 % ophthalmic solution Place 1 drop into both eyes 3 (three) times daily.   telmisartan (MICARDIS) 20 MG tablet Take 1 tablet (20 mg total) by mouth daily.   traZODone (DESYREL) 50 MG tablet Take 1 tablet (50 mg total) by mouth  at bedtime.   No current facility-administered medications on file prior to visit.    Allergies:  No Known Allergies  Social History:  Social History   Socioeconomic History   Marital status: Single    Spouse name: Not on file   Number of children: Not on file   Years of education: Not on file   Highest education level: Not on file  Occupational History   Not on file  Tobacco Use   Smoking status: Every Day    Current packs/day: 1.00    Average packs/day: 1 pack/day for 45.0 years (45.0 ttl pk-yrs)    Types: Cigarettes   Smokeless tobacco: Never  Vaping Use   Vaping status: Former  Substance and Sexual Activity   Alcohol use: Yes    Alcohol/week: 3.0 standard drinks of alcohol    Types: 3 Cans of beer per week    Comment: Few beers daily.   Drug use: Yes    Types: "Crack" cocaine, Marijuana, Cocaine    Comment: currently cocaine   Sexual activity: Not Currently  Other Topics Concern   Not on file  Social History Narrative   Not on file   Social Determinants of Health   Financial Resource Strain: Medium Risk (01/20/2023)   Overall Financial Resource Strain (CARDIA)    Difficulty of Paying Living Expenses: Somewhat hard  Food Insecurity: Food Insecurity Present (01/20/2023)   Hunger Vital Sign    Worried About Running Out of Food in the Last Year: Sometimes true    Ran Out of Food in the Last Year: Sometimes true  Transportation Needs: No Transportation Needs (01/20/2023)   PRAPARE - Administrator, Civil Service (Medical): No    Lack of Transportation (Non-Medical): No  Physical Activity: Inactive (01/20/2023)   Exercise Vital Sign    Days of Exercise per Week: 0 days    Minutes of Exercise per Session: 0 min  Stress: Stress Concern Present (01/20/2023)   [employer] of Occupational Health - Occupational Stress Questionnaire    Feeling of Stress : To some extent  Social  Connections: Socially Isolated (01/20/2023)   Social Connection and  Isolation Panel [NHANES]    Frequency of Communication with Friends and Family: More than three times a week    Frequency of Social Gatherings with Friends and Family: Twice a week    Attends Religious Services: Never    Database administrator or Organizations: No    Attends Banker Meetings: Never    Marital Status: Divorced  Catering manager Violence: Not At Risk (01/20/2023)   Humiliation, Afraid, Rape, and Kick questionnaire    Fear of Current or Ex-Partner: No    Emotionally Abused: No    Physically Abused: No    Sexually Abused: No   Social History   Tobacco Use  Smoking Status Every Day   Current packs/day: 1.00   Average packs/day: 1 pack/day for 45.0 years (45.0 ttl pk-yrs)   Types: Cigarettes  Smokeless Tobacco Never   Social History   Substance and Sexual Activity  Alcohol Use Yes   Alcohol/week: 3.0 standard drinks of alcohol   Types: 3 Cans of beer per week   Comment: Few beers daily.    Family History:  Family History  Problem Relation Age of Onset   Cancer Mother        Lung   Cancer Father        Lung   Heart disease Sister    Heart attack Sister    Cancer Brother        Lung   Bipolar disorder Son     Past medical history, surgical history, medications, allergies, family history and social history reviewed with patient today and changes made to appropriate areas of the chart.   ROS All other ROS negative except what is listed above and in the HPI.      Objective:    BP 102/72 (BP Location: Left Arm, Cuff Size: Normal)   Pulse 87   Temp 97.7 F (36.5 C) (Oral)   Wt 120 lb 9.6 oz (54.7 kg)   SpO2 93%   BMI 22.98 kg/m   Wt Readings from Last 3 Encounters:  01/20/23 120 lb 9.6 oz (54.7 kg)  07/20/22 121 lb 1.6 oz (54.9 kg)  08/02/21 139 lb 12.8 oz (63.4 kg)    Physical Exam Vitals and nursing note reviewed.  Constitutional:      General: She is awake. She is not in acute distress.    Appearance: She is well-developed and  well-groomed. She is not ill-appearing or toxic-appearing.  HENT:     Head: Normocephalic.     Right Ear: Hearing and external ear normal.     Left Ear: Hearing and external ear normal.  Eyes:     General: Lids are normal.        Right eye: No discharge.        Left eye: No discharge.     Conjunctiva/sclera: Conjunctivae normal.     Pupils: Pupils are equal, round, and reactive to light.  Neck:     Thyroid: No thyromegaly.     Vascular: No carotid bruit.  Cardiovascular:     Rate and Rhythm: Normal rate and regular rhythm.     Heart sounds: Normal heart sounds. No murmur heard.    No gallop.  Pulmonary:     Effort: Pulmonary effort is normal. No accessory muscle usage or respiratory distress.     Breath sounds: Normal breath sounds.  Abdominal:     General: Bowel sounds are normal. There is no distension.  Palpations: Abdomen is soft.     Tenderness: There is no abdominal tenderness.  Musculoskeletal:     Cervical back: Normal range of motion and neck supple.     Right lower leg: No edema.     Left lower leg: No edema.  Lymphadenopathy:     Cervical: No cervical adenopathy.  Skin:    General: Skin is warm and dry.     Findings: Bruising present.     Comments: Scattered bruises to upper extremities.  No wounds.  Neurological:     Mental Status: She is alert and oriented to person, place, and time.     Deep Tendon Reflexes: Reflexes are normal and symmetric.     Reflex Scores:      Brachioradialis reflexes are 2+ on the right side and 2+ on the left side.      Patellar reflexes are 2+ on the right side and 2+ on the left side. Psychiatric:        Attention and Perception: Attention normal.        Mood and Affect: Mood normal.        Speech: Speech normal.        Behavior: Behavior normal. Behavior is cooperative.        Thought Content: Thought content normal.       01/20/2023   11:12 AM 12/22/2021   12:28 PM 12/18/2020    4:34 PM 08/14/2019    2:51 PM  6CIT  Screen  What Year? 0 points 0 points 0 points 0 points  What month? 0 points 0 points 0 points 0 points  What time? 0 points 0 points 0 points 0 points  Count back from 20 0 points 0 points 0 points 0 points  Months in reverse 0 points 2 points 2 points 0 points  Repeat phrase 0 points 4 points 0 points 0 points  Total Score 0 points 6 points 2 points 0 points   Results for orders placed or performed in visit on 10/28/22  C-reactive protein  Result Value Ref Range   CRP 1 0 - 10 mg/L  Sed Rate (ESR)  Result Value Ref Range   Sed Rate 8 0 - 40 mm/hr  CBC with Differential/Platelet  Result Value Ref Range   WBC 8.8 3.4 - 10.8 x10E3/uL   RBC 4.48 3.77 - 5.28 x10E6/uL   Hemoglobin 13.3 11.1 - 15.9 g/dL   Hematocrit 16.1 09.6 - 46.6 %   MCV 89 79 - 97 fL   MCH 29.7 26.6 - 33.0 pg   MCHC 33.4 31.5 - 35.7 g/dL   RDW 04.5 40.9 - 81.1 %   Platelets 305 150 - 450 x10E3/uL   Neutrophils 45 Not Estab. %   Lymphs 42 Not Estab. %   Monocytes 10 Not Estab. %   Eos 2 Not Estab. %   Basos 1 Not Estab. %   Neutrophils Absolute 3.8 1.4 - 7.0 x10E3/uL   Lymphocytes Absolute 3.7 (H) 0.7 - 3.1 x10E3/uL   Monocytes Absolute 0.9 0.1 - 0.9 x10E3/uL   EOS (ABSOLUTE) 0.2 0.0 - 0.4 x10E3/uL   Basophils Absolute 0.1 0.0 - 0.2 x10E3/uL   Immature Granulocytes 0 Not Estab. %   Immature Grans (Abs) 0.0 0.0 - 0.1 x10E3/uL      Assessment & Plan:   Problem List Items Addressed This Visit       Cardiovascular and Mediastinum   Hypertension    Chronic, stable.  BP below goal today.  Will  continue Telmisartan 20 MG daily at this time due to tighter control with higher doses.  This is offering benefit to proteinuria, urine ALB 80 April 2024.  Recommend checking BP at home 3 mornings a week and documenting for provider + DASH diet.  LABS: CMP.  Recommend complete cessation of cocaine use and smoking.           Respiratory   Simple chronic bronchitis (HCC)    Chronic, improved with Breztri.  Is  working with PharmD on assistance for this.  Will provide her 2 samples today to use at home.  Continue Breztri and Albuterol.  Recommend complete smoking cessation, continue Wellbutrin which has helped with reduction.  Refuses lung CT CA screening at this time, but provided information on this.  She does not wish to pursue pulmonary referral at this time, but may benefit from in future.  Reports would not pursue treatment if cancer presented.  Spirometry next visit.          Endocrine   Hypothyroid    Ongoing and stable with overall ongoing improved symptoms.  Started medication on 09/03/20.  Taking Levothyroxine daily, continue this and adjust as needed.  Labs up to date.        Nervous and Auditory   Nicotine dependence, cigarettes, w unsp disorders     Musculoskeletal and Integument   Other nonthrombocytopenic purpura (HCC)    Chronic.  Noted on exam, recommend gentle skin care and notify provider if wounds present.        Genitourinary   CKD (chronic kidney disease) stage 3, GFR 30-59 ml/min (HCC)    Chronic and stable.  Followed by nephrology in past, last labs and notes reviewed -- she does not want to return at this time.  CrCl 33 based on recent labs -- maintain current Gabapentin dosing but decrease if drops <30.  Refuses to return to nephrology at this time.  Continue on ARB daily.      Relevant Orders   Comprehensive metabolic panel     Other   Cocaine abuse (HCC)    Had been cutting back on use with Wellbutrin on board and feeling better, but is still using.  Praised for reduction of use and continue to recommend complete cessation.  BP trended down with less use, discussed with her.  Have discussed at length high risk for cardiac death or stroke with continued cocaine use + risk for cocaine being mixed with something that could cause harm to her.      Depression, major, recurrent, moderate (HCC)    Ongoing, but improved with Wellbutrin on board.  Denies SI/HI.  She has  reduced cocaine and alcohol use with Wellbutrin on board.  Continue current medication regimen and adjust as needed.  Refills sent in as needed.  To alert provider or go immediately to ER if SI/HI presents.        Elevated hemoglobin A1c    A1c 5.7% today, improved from 6% previous.  Recommend continued focus on diet and regular activity.  Continues to maintain her previous weight loss.      Relevant Orders   Bayer DCA Hb A1c Waived (STAT)   ETOH abuse    Ongoing.  Recommend complete cessation of alcohol use.  Discussed with her at length the effects of alcohol on body, especially on blood pressure.      Hyperlipemia    Chronic, ongoing.  Continue current medication regimen and adjust as needed.  Lipid panel today.  Relevant Orders   Comprehensive metabolic panel   Lipid Panel w/o Chol/HDL Ratio   Other Visit Diagnoses     Medicare annual wellness visit, subsequent    -  Primary   Medicare Wellness due and performed with patient today.   Flu vaccine need       Flu vaccine today, educated patient.   Relevant Orders   Flu Vaccine Trivalent High Dose (Fluad) (Completed)        Follow up plan: Return in about 28 weeks (around 08/04/2023) for Annual Physical after 08/03/22.   LABORATORY TESTING:  - Pap smear: not applicable  IMMUNIZATIONS:   - Tdap: Tetanus vaccination status reviewed: last tetanus booster within 10 years. - Influenza: Provided today - Pneumovax: Up To Date - Prevnar: Up To Date - COVID: Refused - HPV: Not applicable - Shingrix vaccine: Refused  SCREENING: -Mammogram: Refused  - Colonoscopy: Refused  - Bone Density: Refused  -Hearing Test: Not applicable  -Spirometry: Refused   PATIENT COUNSELING:   Advised to take 1 mg of folate supplement per day if capable of pregnancy.   Sexuality: Discussed sexually transmitted diseases, partner selection, use of condoms, avoidance of unintended pregnancy  and contraceptive alternatives.   Advised to avoid  cigarette smoking.  I discussed with the patient that most people either abstain from alcohol or drink within safe limits (<=14/week and <=4 drinks/occasion for males, <=7/weeks and <= 3 drinks/occasion for females) and that the risk for alcohol disorders and other health effects rises proportionally with the number of drinks per week and how often a drinker exceeds daily limits.  Discussed cessation/primary prevention of drug use and availability of treatment for abuse.   Diet: Encouraged to adjust caloric intake to maintain  or achieve ideal body weight, to reduce intake of dietary saturated fat and total fat, to limit sodium intake by avoiding high sodium foods and not adding table salt, and to maintain adequate dietary potassium and calcium preferably from fresh fruits, vegetables, and low-fat dairy products.    Stressed the importance of regular exercise  Injury prevention: Discussed safety belts, safety helmets, smoke detector, smoking near bedding or upholstery.   Dental health: Discussed importance of regular tooth brushing, flossing, and dental visits.    NEXT PREVENTATIVE PHYSICAL DUE IN 1 YEAR. Return in about 28 weeks (around 08/04/2023) for Annual Physical after 08/03/22.

## 2023-01-20 NOTE — Assessment & Plan Note (Signed)
Ongoing.  Recommend complete cessation of alcohol use.  Discussed with her at length the effects of alcohol on body, especially on blood pressure.

## 2023-01-20 NOTE — Assessment & Plan Note (Addendum)
Ongoing and stable with overall ongoing improved symptoms.  Started medication on 09/03/20.  Taking Levothyroxine daily, continue this and adjust as needed.  Labs up to date.

## 2023-01-20 NOTE — Assessment & Plan Note (Signed)
A1c 5.7% today, improved from 6% previous.  Recommend continued focus on diet and regular activity.  Continues to maintain her previous weight loss.

## 2023-01-21 ENCOUNTER — Other Ambulatory Visit: Payer: Self-pay | Admitting: Nurse Practitioner

## 2023-01-21 DIAGNOSIS — N1832 Chronic kidney disease, stage 3b: Secondary | ICD-10-CM

## 2023-01-21 LAB — COMPREHENSIVE METABOLIC PANEL
ALT: 20 [IU]/L (ref 0–32)
AST: 22 [IU]/L (ref 0–40)
Albumin: 4.5 g/dL (ref 3.9–4.9)
Alkaline Phosphatase: 99 [IU]/L (ref 44–121)
BUN/Creatinine Ratio: 20 (ref 12–28)
BUN: 33 mg/dL — ABNORMAL HIGH (ref 8–27)
Bilirubin Total: 0.3 mg/dL (ref 0.0–1.2)
CO2: 20 mmol/L (ref 20–29)
Calcium: 9.9 mg/dL (ref 8.7–10.3)
Chloride: 107 mmol/L — ABNORMAL HIGH (ref 96–106)
Creatinine, Ser: 1.65 mg/dL — ABNORMAL HIGH (ref 0.57–1.00)
Globulin, Total: 2.3 g/dL (ref 1.5–4.5)
Glucose: 96 mg/dL (ref 70–99)
Potassium: 5.4 mmol/L — ABNORMAL HIGH (ref 3.5–5.2)
Sodium: 142 mmol/L (ref 134–144)
Total Protein: 6.8 g/dL (ref 6.0–8.5)
eGFR: 33 mL/min/{1.73_m2} — ABNORMAL LOW (ref >59)

## 2023-01-21 LAB — LIPID PANEL W/O CHOL/HDL RATIO
Cholesterol, Total: 148 mg/dL (ref 100–199)
HDL: 77 mg/dL (ref >39)
LDL Chol Calc (NIH): 54 mg/dL (ref 0–99)
Triglycerides: 91 mg/dL (ref 0–149)
VLDL Cholesterol Cal: 17 mg/dL (ref 5–40)

## 2023-01-27 ENCOUNTER — Other Ambulatory Visit: Payer: Medicare HMO

## 2023-01-27 DIAGNOSIS — N1832 Chronic kidney disease, stage 3b: Secondary | ICD-10-CM

## 2023-01-27 DIAGNOSIS — E875 Hyperkalemia: Secondary | ICD-10-CM

## 2023-01-28 LAB — BASIC METABOLIC PANEL
BUN/Creatinine Ratio: 27 (ref 12–28)
BUN: 53 mg/dL — ABNORMAL HIGH (ref 8–27)
CO2: 21 mmol/L (ref 20–29)
Calcium: 9 mg/dL (ref 8.7–10.3)
Chloride: 105 mmol/L (ref 96–106)
Creatinine, Ser: 2 mg/dL — ABNORMAL HIGH (ref 0.57–1.00)
Glucose: 96 mg/dL (ref 70–99)
Potassium: 5.5 mmol/L — ABNORMAL HIGH (ref 3.5–5.2)
Sodium: 140 mmol/L (ref 134–144)
eGFR: 26 mL/min/{1.73_m2} — ABNORMAL LOW (ref >59)

## 2023-01-28 NOTE — Progress Notes (Signed)
Good morning, please let Kristen Mills know her labs have returned -- we need lab visit this Friday: - Kidney function is showing some decline this check.  I really need her to get into nephrology ASAP for follow-up.  She last saw Dr. Suezanne Jacquet in October 2022 and should be able to schedule without a referral - staff please assist her with this and get her scheduled.  Kristen Mills your kidney function is showing more Stage 4 now and we do not want you getting to Stage 5 disease.  Please work on cutting back on cocaine use. - Your potassium remains elevated, in fact trended up a bit.  Please hold your Telmisartan (do not take it) and I want to recheck labs on Friday.  Any questions? Keep being stellar!!  Thank you for allowing me to participate in your care.  I appreciate you. Kindest regards, Laketia Vicknair

## 2023-02-03 ENCOUNTER — Other Ambulatory Visit: Payer: Medicare HMO

## 2023-02-03 DIAGNOSIS — E875 Hyperkalemia: Secondary | ICD-10-CM | POA: Diagnosis not present

## 2023-02-03 DIAGNOSIS — N1832 Chronic kidney disease, stage 3b: Secondary | ICD-10-CM | POA: Diagnosis not present

## 2023-02-04 LAB — BASIC METABOLIC PANEL
BUN/Creatinine Ratio: 19 (ref 12–28)
BUN: 26 mg/dL (ref 8–27)
CO2: 22 mmol/L (ref 20–29)
Calcium: 9.8 mg/dL (ref 8.7–10.3)
Chloride: 104 mmol/L (ref 96–106)
Creatinine, Ser: 1.38 mg/dL — ABNORMAL HIGH (ref 0.57–1.00)
Glucose: 121 mg/dL — ABNORMAL HIGH (ref 70–99)
Potassium: 5.1 mmol/L (ref 3.5–5.2)
Sodium: 141 mmol/L (ref 134–144)
eGFR: 41 mL/min/{1.73_m2} — ABNORMAL LOW (ref >59)

## 2023-02-04 NOTE — Progress Notes (Signed)
Good morning crew, please let Kristen Mills know her labs have returned.  Potassium level is now better and kidney function is better but still in chronic kidney disease stage 3b.  Please call nephrology to schedule follow-up, my staff can assist with this if needed.  It is very important.  [phone] (Work)  Any questions? Keep being stellar!!  Thank you for allowing me to participate in your care.  I appreciate you. Kindest regards, Aurelio Mccamy

## 2023-02-15 ENCOUNTER — Telehealth: Payer: Self-pay | Admitting: Nurse Practitioner

## 2023-02-15 NOTE — Telephone Encounter (Signed)
2 samples of Breztri given to the patient per Jolene.

## 2023-02-15 NOTE — Telephone Encounter (Signed)
Patient came by requesting samples of Breztri Inhaler. I am forwarding message to [facility] and provider. Informed patient to allow 48-72 hours for response/reply. Can contact patient ai [phone] if any questions.

## 2023-02-20 NOTE — Progress Notes (Signed)
   02/20/2023  Patient ID: Kristen Mills, female   DOB: May 26, 1950, 72 y.o.   MRN: [id number]  Completed [employer] PAP application for Breztri.  Contacted patient to arrange time for her to review, sign and date application.  Ms. Lane is coming in to see me at [facility] 12/4 at 3pm.  Lenna Gilford, PharmD, DPLA

## 2023-03-01 ENCOUNTER — Other Ambulatory Visit: Payer: Self-pay

## 2023-03-01 NOTE — Progress Notes (Signed)
   03/01/2023  Patient ID: Kristen Mills, female   DOB: June 19, 1950, 72 y.o.   MRN: [id number]  Patient presenting to complete [employer]Me PAP application for [facility].  Reviewed prefilled information, patient/provider signed and dated application, and application faxed to company.  Provided patient with another [facility] sample, also.  Lenna Gilford, PharmD, DPLA

## 2023-03-02 ENCOUNTER — Other Ambulatory Visit: Payer: Self-pay | Admitting: Nurse Practitioner

## 2023-03-03 ENCOUNTER — Other Ambulatory Visit: Payer: Self-pay | Admitting: Nurse Practitioner

## 2023-03-03 NOTE — Telephone Encounter (Signed)
Requested medication (s) are due for refill today - unsure  Requested medication (s) are on the active medication list -no  Future visit scheduled -yes  Last refill: unsure  Notes to clinic: off protocol- provider review , medication not found on current medication list  Requested Prescriptions  Pending Prescriptions Disp Refills   STIOLTO RESPIMAT 2.5-2.5 MCG/ACT AERS [Pharmacy Med Name: STIOLTO RESPIMAT 2.5-2.5MCG/ACTU SPRY] 12 each 0    Sig: Inhale 2 puffs by mouth once daily     Off-Protocol Failed - 03/02/2023 11:40 AM      Failed - Medication not assigned to a protocol, review manually.      Passed - Valid encounter within last 12 months    Recent Outpatient Visits           1 month ago Medicare annual wellness visit, subsequent   [place] [facility] [place], [place] T, NP   7 months ago Simple chronic bronchitis (HCC)   [place] [facility] [place], Corrie Dandy T, NP   1 year ago Acute bacterial rhinosinusitis   [place] [facility] Mec[place], Oswaldo Conroy, PA-C   1 year ago Simple chronic bronchitis (HCC)   [place] [facility] [place], Corrie Dandy T, NP   2 years ago Stage 3b chronic kidney disease (HCC)   [place] [facility] [place], Dorie Rank, NP       Future Appointments             In 5 months Cannady, Dorie Rank, NP [place] [facility]               Requested Prescriptions  Pending Prescriptions Disp Refills   STIOLTO RESPIMAT 2.5-2.5 MCG/ACT AERS [Pharmacy Med Name: STIOLTO RESPIMAT 2.5-2.5MCG/ACTU SPRY] 12 each 0    Sig: Inhale 2 puffs by mouth once daily     Off-Protocol Failed - 03/02/2023 11:40 AM      Failed - Medication not assigned to a protocol, review manually.      Passed - Valid encounter within last 12 months    Recent Outpatient Visits           1 month ago Medicare annual wellness visit, subsequent   [place] [facility] [place],  [place] T, NP   7 months ago Simple chronic bronchitis (HCC)   [place] [facility] [place], Corrie Dandy T, NP   1 year ago Acute bacterial rhinosinusitis   [place] [facility] Mecum, Oswaldo Conroy, PA-C   1 year ago Simple chronic bronchitis (HCC)   [place] [facility] [place], Corrie Dandy T, NP   2 years ago Stage 3b chronic kidney disease (HCC)   [place] [facility] Marjie Skiff, NP       Future Appointments             In 5 months Cannady, Dorie Rank, NP [place] [facility]

## 2023-03-16 ENCOUNTER — Other Ambulatory Visit: Payer: Self-pay

## 2023-03-16 NOTE — Progress Notes (Signed)
   03/16/2023  Patient ID: Kristen Mills, female   DOB: Feb 17, 1951, 72 y.o.   MRN: [id number]  Patient has been approved for AZ&Me PAP for Breztri through 03/27/2024.  The medication was shipped on 12/10, and the patient endorses receiving the inhalers.  She states these are working well to help control chronic bronchitis symptoms.  Lenna Gilford, PharmD, DPLA

## 2023-06-29 DIAGNOSIS — R32 Unspecified urinary incontinence: Secondary | ICD-10-CM | POA: Diagnosis not present

## 2023-06-29 DIAGNOSIS — J4489 Other specified chronic obstructive pulmonary disease: Secondary | ICD-10-CM | POA: Diagnosis not present

## 2023-06-29 DIAGNOSIS — I129 Hypertensive chronic kidney disease with stage 1 through stage 4 chronic kidney disease, or unspecified chronic kidney disease: Secondary | ICD-10-CM | POA: Diagnosis not present

## 2023-06-29 DIAGNOSIS — Z809 Family history of malignant neoplasm, unspecified: Secondary | ICD-10-CM | POA: Diagnosis not present

## 2023-06-29 DIAGNOSIS — F325 Major depressive disorder, single episode, in full remission: Secondary | ICD-10-CM | POA: Diagnosis not present

## 2023-06-29 DIAGNOSIS — Z87891 Personal history of nicotine dependence: Secondary | ICD-10-CM | POA: Diagnosis not present

## 2023-06-29 DIAGNOSIS — Z8249 Family history of ischemic heart disease and other diseases of the circulatory system: Secondary | ICD-10-CM | POA: Diagnosis not present

## 2023-06-29 DIAGNOSIS — F419 Anxiety disorder, unspecified: Secondary | ICD-10-CM | POA: Diagnosis not present

## 2023-06-29 DIAGNOSIS — N1832 Chronic kidney disease, stage 3b: Secondary | ICD-10-CM | POA: Diagnosis not present

## 2023-06-29 DIAGNOSIS — M810 Age-related osteoporosis without current pathological fracture: Secondary | ICD-10-CM | POA: Diagnosis not present

## 2023-06-29 DIAGNOSIS — E785 Hyperlipidemia, unspecified: Secondary | ICD-10-CM | POA: Diagnosis not present

## 2023-06-29 DIAGNOSIS — M1990 Unspecified osteoarthritis, unspecified site: Secondary | ICD-10-CM | POA: Diagnosis not present

## 2023-07-03 ENCOUNTER — Other Ambulatory Visit: Payer: Self-pay | Admitting: Nurse Practitioner

## 2023-07-03 MED ORDER — BUPROPION HCL ER (XL) 300 MG PO TB24: 300.0000 mg | ORAL_TABLET | Freq: Every day | ORAL | 12 refills | Status: DC

## 2023-07-04 DIAGNOSIS — H26493 Other secondary cataract, bilateral: Secondary | ICD-10-CM | POA: Diagnosis not present

## 2023-07-04 DIAGNOSIS — H353131 Nonexudative age-related macular degeneration, bilateral, early dry stage: Secondary | ICD-10-CM | POA: Diagnosis not present

## 2023-07-10 ENCOUNTER — Telehealth: Payer: Self-pay

## 2023-07-10 NOTE — Telephone Encounter (Signed)
 Copied from CRM [id number]. Topic: Clinical - Prescription Issue >> Jul 10, 2023  3:31 PM Elle L wrote: Reason for CRM: The patient states that she is prescribed buPROPion (WELLBUTRIN XL) 150 MG twice per day but her insurance is requiring for it to be prescribed as buPROPion (WELLBUTRIN XL) 300 MG once per day. She needs a prior authorization for her insurance as she prefers taking it twice per day. Raina Bunting will be sending a fax as well. Her call back number is [phone] if needed.

## 2023-07-24 ENCOUNTER — Other Ambulatory Visit: Payer: Self-pay | Admitting: Nurse Practitioner

## 2023-07-25 ENCOUNTER — Other Ambulatory Visit: Payer: Self-pay

## 2023-07-25 MED ORDER — TRAZODONE HCL 50 MG PO TABS: 50.0000 mg | ORAL_TABLET | Freq: Every day | ORAL | 3 refills | Status: AC

## 2023-07-25 MED ORDER — GABAPENTIN 300 MG PO CAPS: 300.0000 mg | ORAL_CAPSULE | Freq: Three times a day (TID) | ORAL | 3 refills | Status: AC

## 2023-07-25 MED ORDER — HYDROXYZINE HCL 10 MG PO TABS: 10.0000 mg | ORAL_TABLET | Freq: Two times a day (BID) | ORAL | 3 refills | Status: AC | PRN

## 2023-07-25 MED ORDER — BUPROPION HCL ER (XL) 300 MG PO TB24: 300.0000 mg | ORAL_TABLET | Freq: Every day | ORAL | 3 refills | Status: DC

## 2023-07-25 NOTE — Telephone Encounter (Signed)
 Copied from CRM [id number]. Topic: Clinical - Prescription Issue >> Jul 25, 2023  3:14 PM Star [place] wrote: Reason for CRM: gabapentin  (NEURONTIN ) 300 MG capsule traZODone  (DESYREL ) 50 MG tablet buPROPion  (WELLBUTRIN  XL) 300 MG 24 hr tablet hydrOXYzine  (ATARAX ) 10 MG tablet Pharmacy does not have the refills yet

## 2023-07-26 NOTE — Telephone Encounter (Signed)
 Bupropion  150 mg discontinued by provider, will refuse due to this. All others refilled on 07/25/23, will refuse these requests.  Requested Prescriptions  Refused Prescriptions Disp Refills   gabapentin  (NEURONTIN ) 300 MG capsule [Pharmacy Med Name: GABAPENTIN  300 MG CAP] 270 capsule 4    Sig: TAKE 1 CAPSULE BY MOUTH 3 TIMES DAILY     Neurology: Anticonvulsants - gabapentin  Failed - 07/26/2023 12:57 PM      Failed - Cr in normal range and within 360 days    Creatinine  Date Value Ref Range Status  02/08/2012 0.66 0.60 - 1.30 mg/dL Final   Creatinine, Ser  Date Value Ref Range Status  02/03/2023 1.38 (H) 0.57 - 1.00 mg/dL Final         Failed - Valid encounter within last 12 months    Recent Outpatient Visits   None     Future Appointments             In 1 week Kristen Mills, Kristen Posey, Kristen Mills [place] [facility]            Passed - Completed PHQ-2 or PHQ-9 in the last 360 days       buPROPion  (WELLBUTRIN  XL) 150 MG 24 hr tablet [Pharmacy Med Name: BUPROPION  HCL ER (XL) 150 MG TAB] 180 tablet 4    Sig: TAKE 2 TABLETS BY MOUTH DAILY     Psychiatry: Antidepressants - bupropion  Failed - 07/26/2023 12:57 PM      Failed - Cr in normal range and within 360 days    Creatinine  Date Value Ref Range Status  02/08/2012 0.66 0.60 - 1.30 mg/dL Final   Creatinine, Ser  Date Value Ref Range Status  02/03/2023 1.38 (H) 0.57 - 1.00 mg/dL Final         Failed - Valid encounter within last 6 months    Recent Outpatient Visits   None     Future Appointments             In 1 week Kristen Mills, Kristen Posey, Kristen Mills [place] [facility]            Passed - AST in normal range and within 360 days    AST  Date Value Ref Range Status  01/20/2023 22 0 - 40 IU/L Final         Passed - ALT in normal range and within 360 days    ALT  Date Value Ref Range Status  01/20/2023 20 0 - 32 IU/L Final         Passed - Completed PHQ-2 or PHQ-9 in the last 360  days      Passed - Last BP in normal range    BP Readings from Last 1 Encounters:  01/20/23 102/72          traZODone  (DESYREL ) 50 MG tablet [Pharmacy Med Name: TRAZODONE  HCL 50 MG TAB] 90 tablet 4    Sig: TAKE 1 TABLET BY MOUTH AT BEDTIME     Psychiatry: Antidepressants - Serotonin Modulator Failed - 07/26/2023 12:57 PM      Failed - Valid encounter within last 6 months    Recent Outpatient Visits   None     Future Appointments             In 1 week Kristen Mills, Kristen Posey, Kristen Mills [place] [facility]            Passed - Completed PHQ-2 or PHQ-9 in the last  360 days       hydrOXYzine  (ATARAX ) 10 MG tablet [Pharmacy Med Name: HYDROXYZINE  HCL 10 MG TAB] 180 tablet 4    Sig: TAKE 1 TABLET BY MOUTH 2 TIMES DAILY AS NEEDED     Ear, Nose, and Throat:  Antihistamines 2 Failed - 07/26/2023 12:57 PM      Failed - Cr in normal range and within 360 days    Creatinine  Date Value Ref Range Status  02/08/2012 0.66 0.60 - 1.30 mg/dL Final   Creatinine, Ser  Date Value Ref Range Status  02/03/2023 1.38 (H) 0.57 - 1.00 mg/dL Final         Failed - Valid encounter within last 12 months    Recent Outpatient Visits   None     Future Appointments             In 1 week Kristen Mills, Kristen Posey, Kristen Mills [place] [facility]

## 2023-08-05 NOTE — Patient Instructions (Signed)
 Be Involved in Caring For Your Health:  Taking Medications When medications are taken as directed, they can greatly improve your health. But if they are not taken as prescribed, they may not work. In some cases, not taking them correctly can be harmful. To help ensure your treatment remains effective and safe, understand your medications and how to take them. Bring your medications to each visit for review by your provider.  Your lab results, notes, and after visit summary will be available on My Chart. We strongly encourage you to use this feature. If lab results are abnormal the clinic will contact you with the appropriate steps. If the clinic does not contact you assume the results are satisfactory. You can always view your results on My Chart. If you have questions regarding your health or results, please contact the clinic during office hours. You can also ask questions on My Chart.  We at [facility] are grateful that you chose [place] to provide your care. We strive to provide evidence-based and compassionate care and are always looking for feedback. If you get a survey from the clinic please complete this so we can hear your opinions.  Eating Plan for Chronic Obstructive Pulmonary Disease Chronic obstructive pulmonary disease (COPD) causes symptoms such as shortness of breath, coughing, and chest discomfort. These symptoms can make it difficult to eat enough to maintain a healthy weight. Generally, people with COPD should eat a diet that is high in calories, protein, and other nutrients to maintain body weight and to keep the lungs as healthy as possible. Depending on the medicines you take and other health conditions you may have, your health care provider may give you additional recommendations on what to eat or avoid. Talk with your health care provider about your goals for body weight, and work with a dietitian to develop an eating plan that is right for you. What are tips for  following this plan? Reading food labels  Avoid foods with more than 300 milligrams (mg) of salt (sodium) per serving. Choose foods that contain at least 4 grams (g) of fiber per serving. Try to eat 20-30 g of fiber each day. Choose foods that are high in calories and protein, such as nuts, beans, yogurt, and cheese. Shopping Do not buy foods labeled as diet, low-calorie, or low-fat. If you are able to eat dairy products: Avoid low-fat or skim milk. Buy dairy products that have at least 2% fat. Buy nutritional supplement drinks. Buy grains and prepared foods labeled as enriched or fortified. Consider buying low-sodium, pre-made foods to conserve energy for eating. Cooking Add dry milk or protein powder to smoothies. Cook with healthy fats, such as olive oil, canola oil, sunflower oil, and grapeseed oil. Add oil, butter, cream cheese, or nut butters to foods to increase fat and calories. To make foods easier to chew and swallow: Cook vegetables, pasta, and rice until soft. Cut or grind meat into very small pieces. Dip breads in liquid. Meal planning  Eat when you feel hungry. Eat 5-6 small meals throughout the day. Drink 6-8 glasses of water each day. Do not drink liquids with meals. Drink liquids at the end of the meal to avoid feeling full too quickly. Eat a variety of fruits and vegetables every day. Ask for assistance from family or friends with planning and preparing meals as needed. Avoid foods that cause you to feel bloated, such as carbonated drinks, fried foods, beans, broccoli, cabbage, and apples. For older adults, ask your local  agency on aging whether you are eligible for meal assistance programs, such as Meals on Wheels. Lifestyle  Do not smoke. Eat slowly. Take small bites and chew food well before swallowing. Do not overeat. This may make it more difficult to breathe after eating. Sit up while eating. If needed, continue to use supplemental oxygen while  eating. Rest or relax for 30 minutes before and after eating. Monitor your weight as told by your health care provider. Exercise as told by your health care provider. What foods should I eat? Fruits All fresh, dried, canned, or frozen fruits that do not cause gas. Vegetables All fresh, canned (no salt added), or frozen vegetables that do not cause gas. Grains Whole-grain bread. Enriched whole-grain pasta. Fortified whole-grain cereals. Fortified rice. Quinoa. Meats and other proteins Lean meat. Poultry. Fish. Dried beans. Unsalted nuts. Tofu. Eggs. Nut butters. Dairy Whole or 2% milk. Cheese. Yogurt. Fats and oils Olive oil. Canola oil. Butter. Margarine. Beverages Water. Vegetable juice (no salt added). Decaffeinated coffee. Decaffeinated or herbal tea. Seasonings and condiments Fresh or dried herbs. Low-salt or salt-free seasonings. Low-sodium soy sauce. The items listed above may not be a complete list of foods and beverages you can eat. Contact a dietitian for more information. What foods should I avoid? Fruits Fruits that cause gas, such as apples or melon. Vegetables Vegetables that cause gas, such as broccoli, Brussels sprouts, cabbage, cauliflower, and onions. Canned vegetables with added salt. Meats and other proteins Fried meat. Salt-cured meat. Processed meat. Dairy Fat-free or low-fat milk, yogurt, or cheese. Processed cheese. Beverages Carbonated drinks. Caffeinated drinks, such as coffee, tea, and soft drinks. Juice. Alcohol. Vegetable juice with added salt. Seasonings and condiments Salt. Seasoning mixes with salt. Soy sauce. Rosita Fire. Other foods Clear soup or broth. Fried foods. Prepared frozen meals. The items listed above may not be a complete list of foods and beverages you should avoid. Contact a dietitian for more information. Summary COPD symptoms can make it difficult to eat enough to maintain a healthy weight. A COPD eating plan can help you maintain  your body weight and keep your lungs as healthy as possible. Eat a diet that is high in calories, protein, and other nutrients. Read labels to make sure that you are getting the right nutrients. Cook foods to make them easier to chew and swallow. Eat 5-6 small meals throughout the day, and avoid foods that cause gas or make you feel bloated. This information is not intended to replace advice given to you by your health care provider. Make sure you discuss any questions you have with your health care provider. Document Revised: 01/20/2023 Document Reviewed: 01/20/2023 Elsevier Patient Education  2024 [employer].

## 2023-08-07 ENCOUNTER — Ambulatory Visit (INDEPENDENT_AMBULATORY_CARE_PROVIDER_SITE_OTHER): Payer: Self-pay | Admitting: Nurse Practitioner

## 2023-08-07 ENCOUNTER — Encounter: Payer: Self-pay | Admitting: Nurse Practitioner

## 2023-08-07 VITALS — BP 131/74 | HR 85 | Temp 97.9°F | Ht 60.7 in | Wt 125.8 lb

## 2023-08-07 DIAGNOSIS — B182 Chronic viral hepatitis C: Secondary | ICD-10-CM | POA: Diagnosis not present

## 2023-08-07 DIAGNOSIS — D692 Other nonthrombocytopenic purpura: Secondary | ICD-10-CM

## 2023-08-07 DIAGNOSIS — F1410 Cocaine abuse, uncomplicated: Secondary | ICD-10-CM | POA: Diagnosis not present

## 2023-08-07 DIAGNOSIS — F331 Major depressive disorder, recurrent, moderate: Secondary | ICD-10-CM | POA: Diagnosis not present

## 2023-08-07 DIAGNOSIS — I10 Essential (primary) hypertension: Secondary | ICD-10-CM | POA: Diagnosis not present

## 2023-08-07 DIAGNOSIS — E782 Mixed hyperlipidemia: Secondary | ICD-10-CM | POA: Diagnosis not present

## 2023-08-07 DIAGNOSIS — N1832 Chronic kidney disease, stage 3b: Secondary | ICD-10-CM

## 2023-08-07 DIAGNOSIS — F17219 Nicotine dependence, cigarettes, with unspecified nicotine-induced disorders: Secondary | ICD-10-CM

## 2023-08-07 DIAGNOSIS — Z0000 Encounter for general adult medical examination without abnormal findings: Secondary | ICD-10-CM

## 2023-08-07 DIAGNOSIS — R7309 Other abnormal glucose: Secondary | ICD-10-CM

## 2023-08-07 DIAGNOSIS — J410 Simple chronic bronchitis: Secondary | ICD-10-CM

## 2023-08-07 DIAGNOSIS — E039 Hypothyroidism, unspecified: Secondary | ICD-10-CM

## 2023-08-07 LAB — BAYER DCA HB A1C WAIVED: HB A1C (BAYER DCA - WAIVED): 5.5 % (ref 4.8–5.6)

## 2023-08-07 LAB — MICROALBUMIN, URINE WAIVED
Creatinine, Urine Waived: 100 mg/dL (ref 10–300)
Microalb, Ur Waived: 150 mg/L — ABNORMAL HIGH (ref 0–19)

## 2023-08-07 MED ORDER — PREDNISONE 20 MG PO TABS: 40.0000 mg | ORAL_TABLET | Freq: Every day | ORAL | 0 refills | Status: AC

## 2023-08-07 MED ORDER — ATORVASTATIN CALCIUM 20 MG PO TABS: 20.0000 mg | ORAL_TABLET | Freq: Every day | ORAL | 4 refills | Status: AC

## 2023-08-07 MED ORDER — CHOLECALCIFEROL 1.25 MG (50000 UT) PO CAPS: 50000.0000 [IU] | ORAL_CAPSULE | ORAL | 4 refills | Status: AC

## 2023-08-07 MED ORDER — AMOXICILLIN-POT CLAVULANATE 875-125 MG PO TABS: 1.0000 | ORAL_TABLET | Freq: Two times a day (BID) | ORAL | 0 refills | Status: AC

## 2023-08-07 MED ORDER — ALBUTEROL SULFATE HFA 108 (90 BASE) MCG/ACT IN AERS: INHALATION_SPRAY | RESPIRATORY_TRACT | 5 refills | Status: AC

## 2023-08-07 MED ORDER — TELMISARTAN 20 MG PO TABS: 20.0000 mg | ORAL_TABLET | Freq: Every day | ORAL | 4 refills | Status: AC

## 2023-08-07 MED ORDER — LEVOTHYROXINE SODIUM 50 MCG PO TABS: 50.0000 ug | ORAL_TABLET | Freq: Every day | ORAL | 4 refills | Status: AC

## 2023-08-07 MED ORDER — BUPROPION HCL ER (XL) 300 MG PO TB24: 300.0000 mg | ORAL_TABLET | Freq: Every day | ORAL | 3 refills | Status: AC

## 2023-08-07 NOTE — Assessment & Plan Note (Signed)
 A1c 5.5% today, improved from 5.7% previous.  Recommend continued focus on diet and regular activity.  Continues to maintain her previous weight loss.

## 2023-08-07 NOTE — Assessment & Plan Note (Signed)
 Ongoing and stable.  Started medication on 09/03/20.  Taking Levothyroxine  daily, continue this and adjust as needed.  Labs today.

## 2023-08-07 NOTE — Assessment & Plan Note (Signed)
 Chronic, stable.  BP at goal today. Continue Telmisartan  20 MG daily at this time due to tighter control with higher doses.  This is offering benefit to proteinuria, urine ALB 150 May 2025.  Recommend checking BP at home 3 mornings a week and documenting for provider + DASH diet.  LABS: CBC, TSH, CMP, urine ALB.  Recommend complete cessation of cocaine use and smoking.

## 2023-08-07 NOTE — Assessment & Plan Note (Signed)
Chronic.  Continue supplement and recheck Vit D level today.  May consider switch from weekly to daily dosing if improved.

## 2023-08-07 NOTE — Assessment & Plan Note (Signed)
I have recommended complete cessation of tobacco use. She continues on Wellbutrin, which has offered benefit.  Refuses CT lung screening.  

## 2023-08-07 NOTE — Assessment & Plan Note (Signed)
 Consider GI referral for further work-up in future, at this time refuses both labs for this or referral.  Recommended no ETOH or cocaine use.

## 2023-08-07 NOTE — Assessment & Plan Note (Signed)
 Ongoing, stable.  Denies SI/HI.  She has reduced cocaine and alcohol  use with Wellbutrin  on board.  Continue current medication regimen and adjust as needed.  Refills sent in as needed.  To alert provider or go immediately to ER if SI/HI presents.

## 2023-08-07 NOTE — Assessment & Plan Note (Signed)
 Had been cutting back on use with Wellbutrin  on board and feeling better, but is still using weekly.  Praised for reduction of use and continue to recommend complete cessation.  BP trended down with less use, discussed with her.  Have discussed at length high risk for cardiac death or stroke with continued cocaine use + risk for cocaine being mixed with something that could cause harm to her.  Had recent event where she blacked out.

## 2023-08-07 NOTE — Assessment & Plan Note (Signed)
Chronic.  Noted on exam, recommend gentle skin care and notify provider if wounds present.

## 2023-08-07 NOTE — Assessment & Plan Note (Signed)
 Chronic, stable.  Followed by nephrology in past, last labs and notes reviewed -- she does not want to return at this time as reports she won't do what they tell her.  CrCl 33 based on recent labs -- maintain current Gabapentin  dosing but decrease if drops <30.  Refuses to return to nephrology at this time.  Continue on ARB daily.

## 2023-08-07 NOTE — Assessment & Plan Note (Addendum)
 Chronic, stable with Breztri.  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  Continue Breztri and Albuterol .  Recommend complete smoking cessation, continue Wellbutrin  which has helped with reduction.  Refuses lung CT CA screening at this time, but provided information on this.  She does not wish to pursue pulmonary referral at this time, but may benefit from in future.  Reports would not pursue treatment if cancer presented.  Spirometry next visit.   - Current exacerbation, will treat with Augmentin  and Prednisone , which has worked well in past.

## 2023-08-07 NOTE — Assessment & Plan Note (Signed)
 Chronic, ongoing.  Continue current medication regimen and adjust as needed. Lipid panel today.

## 2023-08-07 NOTE — Assessment & Plan Note (Signed)
Ongoing.  Recommend complete cessation of alcohol use.  Discussed with her at length the effects of alcohol on body, especially on blood pressure.

## 2023-08-07 NOTE — Assessment & Plan Note (Signed)
Chronic, improved on Trazodone.  Continue current medication regimen, refills sent.   
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
## 2023-08-08 ENCOUNTER — Ambulatory Visit: Payer: Self-pay | Admitting: Nurse Practitioner

## 2023-08-08 LAB — CBC WITH DIFFERENTIAL/PLATELET
Basophils Absolute: 0.1 10*3/uL (ref 0.0–0.2)
Basos: 2 %
EOS (ABSOLUTE): 0.2 10*3/uL (ref 0.0–0.4)
Eos: 3 %
Hematocrit: 43.9 % (ref 34.0–46.6)
Hemoglobin: 14.2 g/dL (ref 11.1–15.9)
Immature Grans (Abs): 0 10*3/uL (ref 0.0–0.1)
Immature Granulocytes: 0 %
Lymphocytes Absolute: 3.7 10*3/uL — ABNORMAL HIGH (ref 0.7–3.1)
Lymphs: 45 %
MCH: 29.8 pg (ref 26.6–33.0)
MCHC: 32.3 g/dL (ref 31.5–35.7)
MCV: 92 fL (ref 79–97)
Monocytes Absolute: 0.8 10*3/uL (ref 0.1–0.9)
Monocytes: 9 %
Neutrophils Absolute: 3.3 10*3/uL (ref 1.4–7.0)
Neutrophils: 41 %
Platelets: 272 10*3/uL (ref 150–450)
RBC: 4.77 x10E6/uL (ref 3.77–5.28)
RDW: 12.5 % (ref 11.7–15.4)
WBC: 8.1 10*3/uL (ref 3.4–10.8)

## 2023-08-08 LAB — COMPREHENSIVE METABOLIC PANEL WITH GFR
ALT: 17 IU/L (ref 0–32)
AST: 22 IU/L (ref 0–40)
Albumin: 4.4 g/dL (ref 3.8–4.8)
Alkaline Phosphatase: 109 IU/L (ref 44–121)
BUN/Creatinine Ratio: 24 (ref 12–28)
BUN: 34 mg/dL — ABNORMAL HIGH (ref 8–27)
Bilirubin Total: 0.2 mg/dL (ref 0.0–1.2)
CO2: 21 mmol/L (ref 20–29)
Calcium: 9.8 mg/dL (ref 8.7–10.3)
Chloride: 101 mmol/L (ref 96–106)
Creatinine, Ser: 1.43 mg/dL — ABNORMAL HIGH (ref 0.57–1.00)
Globulin, Total: 2.2 g/dL (ref 1.5–4.5)
Glucose: 96 mg/dL (ref 70–99)
Potassium: 4.9 mmol/L (ref 3.5–5.2)
Sodium: 139 mmol/L (ref 134–144)
Total Protein: 6.6 g/dL (ref 6.0–8.5)
eGFR: 39 mL/min/{1.73_m2} — ABNORMAL LOW (ref >59)

## 2023-08-08 LAB — LIPID PANEL W/O CHOL/HDL RATIO
Cholesterol, Total: 173 mg/dL (ref 100–199)
HDL: 66 mg/dL (ref >39)
LDL Chol Calc (NIH): 87 mg/dL (ref 0–99)
Triglycerides: 110 mg/dL (ref 0–149)
VLDL Cholesterol Cal: 20 mg/dL (ref 5–40)

## 2023-08-08 LAB — TSH: TSH: 2.85 u[IU]/mL (ref 0.450–4.500)

## 2023-08-08 LAB — VITAMIN D 25 HYDROXY (VIT D DEFICIENCY, FRACTURES): Vit D, 25-Hydroxy: 27.1 ng/mL — ABNORMAL LOW (ref 30.0–100.0)

## 2023-08-08 LAB — T4, FREE: Free T4: 1.14 ng/dL (ref 0.82–1.77)

## 2023-08-08 NOTE — Progress Notes (Signed)
 Good afternoon, please let Kristen Mills know her labs have returned: - CBC overall remains stable, still just a mild elevation in lymphocytes (one of the white blood cells) but we can continue to monitor this for now. - Vitamin D  level remains a bit low, ensure you are taking Vitamin D3 supplement daily for bone health. - Kidneys continue to show Stage 3b kidney disease.  I know she prefers not to return to kidney doctor, so we can continue to monitor here for now but if enters Stage 4 level she will need to return to kidney doctor. - Remainder of labs stable.  No medication changes needed.  Any questions? Keep being amazing!!  Thank you for allowing me to participate in your care.  I appreciate you. Kindest regards, Blanche Scovell

## 2023-09-13 ENCOUNTER — Telehealth: Payer: Self-pay

## 2023-09-13 NOTE — Telephone Encounter (Signed)
 Copied from CRM [id number]. Topic: Clinical - Prescription Issue >> Sep 13, 2023  2:20 PM Phil Braun wrote: Reason for CRM:   Ref:hydrOXYzine  (ATARAX ) 10 MG tablet   Pt states its been 3 weeks since she was prescribed this medication and she is still waiting on authorization. Her pharmacy called her today and said that they are still waiting. Please get in touch with insurance company and pt to advise.

## 2023-09-13 NOTE — Telephone Encounter (Signed)
 Routing to prior auth team to check into authorization.

## 2023-09-14 ENCOUNTER — Telehealth: Payer: Self-pay

## 2023-09-14 ENCOUNTER — Other Ambulatory Visit (HOSPITAL_COMMUNITY): Payer: Self-pay

## 2023-09-14 NOTE — Telephone Encounter (Signed)
 PA request has been Submitted. New Encounter has been or will be created for follow up. For additional info see Pharmacy Prior Auth telephone encounter from 09/14/23.

## 2023-09-14 NOTE — Telephone Encounter (Signed)
 Pharmacy Patient Advocate Encounter   Received notification from Onbase that prior authorization for hydrOXYzine  HCl 10MG  tablets  is required/requested.   Insurance verification completed.   The patient is insured through [employer]   Per test claim: PA required; PA submitted to above mentioned insurance via CoverMyMeds Key/confirmation #/EOC [id number] Status is pending

## 2023-09-19 ENCOUNTER — Telehealth: Payer: Self-pay

## 2023-09-19 NOTE — Telephone Encounter (Signed)
 Original PA request denied, attempting E-appeal. Additional information request received from insurance. Form has been filled out and faxed back to [phone]

## 2023-09-21 NOTE — Telephone Encounter (Signed)
 Copied from CRM [id number]. Topic: Clinical - Medication Prior Auth >> Sep 21, 2023 12:02 PM Kristen Mills wrote: Reason for CRM: Dung with [facility] - still working on the appeal for hydrOXYzine  (ATARAX ) 10 MG tablet, need clinical notes- asked to be transferred to CAL but then hung up while calling

## 2023-09-26 NOTE — Telephone Encounter (Signed)
 PA incorrectly denied, resubmission and approval documented in separate encounter. Closing this encounter.

## 2023-09-26 NOTE — Telephone Encounter (Signed)
 Pharmacy Patient Advocate Encounter  Received notification from [employer] that Appeal for Hydroxyzine  has been APPROVED from 05/27/2023 to 03/27/2024

## 2023-09-26 NOTE — Telephone Encounter (Signed)
 Appeal has been approved! Thank you for your patience.

## 2023-12-29 ENCOUNTER — Ambulatory Visit: Payer: Self-pay

## 2023-12-29 NOTE — Telephone Encounter (Signed)
 FYI Only or Action Required?: Action required by provider: Requesting Antibiotic.  Patient was last seen in primary care on 08/07/2023 by Valerio Melanie DASEN, NP.  Called Nurse Triage reporting Facial Pain.  Symptoms began several weeks ago.  Interventions attempted: Nothing.  Symptoms are: gradually worsening.  Triage Disposition: See PCP When Office is Open (Within 3 Days)  Patient/caregiver understands and will follow disposition?: No, refuses disposition Reason for Disposition  [1] Sinus congestion (pressure, fullness) AND [2] present > 10 days  Answer Assessment - Initial Assessment Questions Patient denying coming in for office visit, stated she's coming in next month anyway will just deal with it, patient doesn't like going into the doctors and has trouble getting transportation. Educated patient on importance of being seen to get medication. Patient stated PCP sends in antibiotics all the time when she's sick with sinus issues because it happens often. Patient requesting mediation be sent to [employer] on file. Please advise   1. LOCATION: Where does it hurt?      Eye balls, cheekbones sore  2. ONSET: When did the sinus pain start?  (e.g., hours, days)      Weeks ago, haven't wanted to call doctor  3. SEVERITY: How bad is the pain?   (Scale 0-10; or none, mild, moderate or severe)     Moderate  4. RECURRENT SYMPTOM: Have you ever had sinus problems before? If Yes, ask: When was the last time? and What happened that time?      Yes  5. NASAL CONGESTION: Is the nose blocked? If Yes, ask: Can you open it or must you breathe through your mouth?     Breathing through mouth, but this is not new  6. NASAL DISCHARGE: Do you have discharge from your nose? If so ask, What color?     Yes, lightly yellow  7. FEVER: Do you have a fever? If Yes, ask: What is it, how was it measured, and when did it start?      No  8. OTHER SYMPTOMS: Do you  have any other symptoms? (e.g., sore throat, cough, earache, difficulty breathing)     Cough, nose running  Protocols used: Sinus Pain or Congestion-A-AH  Copied from CRM #[id number]. Topic: Clinical - Red Word Triage >> Dec 29, 2023 11:19 AM Amy B wrote: Red Word that prompted transfer to Nurse Triage: Sinus pain above both eyes and cheekbones

## 2024-01-01 NOTE — Telephone Encounter (Signed)
 Ok for E2C2 to review.  Returned call to patient however she had been sleeping and she requested to call back later to discuss.

## 2024-01-11 ENCOUNTER — Other Ambulatory Visit: Payer: Self-pay

## 2024-01-11 ENCOUNTER — Inpatient Hospital Stay

## 2024-01-11 ENCOUNTER — Inpatient Hospital Stay
Admission: EM | Admit: 2024-01-11 | Discharge: 2024-01-14 | DRG: 871 | Disposition: A | Discharge status: Home / Self Care | Attending: Internal Medicine | Admitting: Internal Medicine

## 2024-01-11 ENCOUNTER — Emergency Department

## 2024-01-11 DIAGNOSIS — J189 Pneumonia, unspecified organism: Secondary | ICD-10-CM | POA: Diagnosis present

## 2024-01-11 DIAGNOSIS — N179 Acute kidney failure, unspecified: Secondary | ICD-10-CM | POA: Diagnosis not present

## 2024-01-11 DIAGNOSIS — A419 Sepsis, unspecified organism: Secondary | ICD-10-CM | POA: Diagnosis not present

## 2024-01-11 DIAGNOSIS — J9601 Acute respiratory failure with hypoxia: Principal | ICD-10-CM

## 2024-01-11 DIAGNOSIS — F17210 Nicotine dependence, cigarettes, uncomplicated: Secondary | ICD-10-CM | POA: Diagnosis not present

## 2024-01-11 DIAGNOSIS — N1832 Chronic kidney disease, stage 3b: Secondary | ICD-10-CM | POA: Diagnosis present

## 2024-01-11 DIAGNOSIS — E785 Hyperlipidemia, unspecified: Secondary | ICD-10-CM | POA: Diagnosis present

## 2024-01-11 DIAGNOSIS — J441 Chronic obstructive pulmonary disease with (acute) exacerbation: Secondary | ICD-10-CM | POA: Diagnosis not present

## 2024-01-11 DIAGNOSIS — Z79890 Hormone replacement therapy (postmenopausal): Secondary | ICD-10-CM | POA: Diagnosis not present

## 2024-01-11 DIAGNOSIS — N281 Cyst of kidney, acquired: Secondary | ICD-10-CM | POA: Diagnosis not present

## 2024-01-11 DIAGNOSIS — N183 Chronic kidney disease, stage 3 unspecified: Secondary | ICD-10-CM | POA: Diagnosis present

## 2024-01-11 DIAGNOSIS — E782 Mixed hyperlipidemia: Secondary | ICD-10-CM | POA: Diagnosis not present

## 2024-01-11 DIAGNOSIS — R0602 Shortness of breath: Secondary | ICD-10-CM | POA: Diagnosis not present

## 2024-01-11 DIAGNOSIS — Z8542 Personal history of malignant neoplasm of other parts of uterus: Secondary | ICD-10-CM

## 2024-01-11 DIAGNOSIS — F1410 Cocaine abuse, uncomplicated: Secondary | ICD-10-CM | POA: Diagnosis present

## 2024-01-11 DIAGNOSIS — I129 Hypertensive chronic kidney disease with stage 1 through stage 4 chronic kidney disease, or unspecified chronic kidney disease: Secondary | ICD-10-CM | POA: Diagnosis present

## 2024-01-11 DIAGNOSIS — I10 Essential (primary) hypertension: Secondary | ICD-10-CM | POA: Diagnosis present

## 2024-01-11 DIAGNOSIS — Z90710 Acquired absence of both cervix and uterus: Secondary | ICD-10-CM

## 2024-01-11 DIAGNOSIS — Z1152 Encounter for screening for COVID-19: Secondary | ICD-10-CM | POA: Diagnosis not present

## 2024-01-11 DIAGNOSIS — F1910 Other psychoactive substance abuse, uncomplicated: Secondary | ICD-10-CM

## 2024-01-11 DIAGNOSIS — J440 Chronic obstructive pulmonary disease with acute lower respiratory infection: Secondary | ICD-10-CM | POA: Diagnosis present

## 2024-01-11 DIAGNOSIS — R6520 Severe sepsis without septic shock: Secondary | ICD-10-CM | POA: Diagnosis not present

## 2024-01-11 DIAGNOSIS — Z8249 Family history of ischemic heart disease and other diseases of the circulatory system: Secondary | ICD-10-CM | POA: Diagnosis not present

## 2024-01-11 DIAGNOSIS — E039 Hypothyroidism, unspecified: Secondary | ICD-10-CM | POA: Diagnosis present

## 2024-01-11 DIAGNOSIS — F419 Anxiety disorder, unspecified: Secondary | ICD-10-CM | POA: Diagnosis not present

## 2024-01-11 DIAGNOSIS — R918 Other nonspecific abnormal finding of lung field: Secondary | ICD-10-CM | POA: Diagnosis not present

## 2024-01-11 DIAGNOSIS — F32A Depression, unspecified: Secondary | ICD-10-CM | POA: Diagnosis present

## 2024-01-11 DIAGNOSIS — R059 Cough, unspecified: Secondary | ICD-10-CM | POA: Diagnosis not present

## 2024-01-11 DIAGNOSIS — Z87891 Personal history of nicotine dependence: Secondary | ICD-10-CM | POA: Diagnosis not present

## 2024-01-11 LAB — URINE DRUG SCREEN, QUALITATIVE (ARMC ONLY)
Amphetamines, Ur Screen: NOT DETECTED
Barbiturates, Ur Screen: NOT DETECTED
Benzodiazepine, Ur Scrn: NOT DETECTED
Cannabinoid 50 Ng, Ur ~~LOC~~: NOT DETECTED
Cocaine Metabolite,Ur ~~LOC~~: POSITIVE — AB
MDMA (Ecstasy)Ur Screen: NOT DETECTED
Methadone Scn, Ur: NOT DETECTED
Opiate, Ur Screen: NOT DETECTED
Phencyclidine (PCP) Ur S: NOT DETECTED
Tricyclic, Ur Screen: NOT DETECTED

## 2024-01-11 LAB — URINALYSIS, ROUTINE W REFLEX MICROSCOPIC
Bilirubin Urine: NEGATIVE
Glucose, UA: NEGATIVE mg/dL
Hgb urine dipstick: NEGATIVE
Ketones, ur: NEGATIVE mg/dL
Leukocytes,Ua: NEGATIVE
Nitrite: NEGATIVE
Protein, ur: 30 mg/dL — AB
RBC / HPF: 0 RBC/hpf (ref 0–5)
Specific Gravity, Urine: 1.016 (ref 1.005–1.030)
pH: 5 (ref 5.0–8.0)

## 2024-01-11 LAB — CBC WITH DIFFERENTIAL/PLATELET
Abs Immature Granulocytes: 0.1 K/uL — ABNORMAL HIGH (ref 0.00–0.07)
Basophils Absolute: 0.1 K/uL (ref 0.0–0.1)
Basophils Relative: 0 %
Eosinophils Absolute: 0 K/uL (ref 0.0–0.5)
Eosinophils Relative: 0 %
HCT: 36.4 % (ref 36.0–46.0)
Hemoglobin: 11.8 g/dL — ABNORMAL LOW (ref 12.0–15.0)
Immature Granulocytes: 1 %
Lymphocytes Relative: 11 %
Lymphs Abs: 2 K/uL (ref 0.7–4.0)
MCH: 29.4 pg (ref 26.0–34.0)
MCHC: 32.4 g/dL (ref 30.0–36.0)
MCV: 90.5 fL (ref 80.0–100.0)
Monocytes Absolute: 1.8 K/uL — ABNORMAL HIGH (ref 0.1–1.0)
Monocytes Relative: 10 %
Neutro Abs: 13.4 K/uL — ABNORMAL HIGH (ref 1.7–7.7)
Neutrophils Relative %: 78 %
Platelets: 287 K/uL (ref 150–400)
RBC: 4.02 MIL/uL (ref 3.87–5.11)
RDW: 13.2 % (ref 11.5–15.5)
Smear Review: NORMAL
WBC: 17.4 K/uL — ABNORMAL HIGH (ref 4.0–10.5)
nRBC: 0 % (ref 0.0–0.2)

## 2024-01-11 LAB — COMPREHENSIVE METABOLIC PANEL WITH GFR
ALT: 11 U/L (ref 0–44)
AST: 20 U/L (ref 15–41)
Albumin: 3.1 g/dL — ABNORMAL LOW (ref 3.5–5.0)
Alkaline Phosphatase: 67 U/L (ref 38–126)
Anion gap: 16 — ABNORMAL HIGH (ref 5–15)
BUN: 54 mg/dL — ABNORMAL HIGH (ref 8–23)
CO2: 20 mmol/L — ABNORMAL LOW (ref 22–32)
Calcium: 8.5 mg/dL — ABNORMAL LOW (ref 8.9–10.3)
Chloride: 100 mmol/L (ref 98–111)
Creatinine, Ser: 2.96 mg/dL — ABNORMAL HIGH (ref 0.44–1.00)
GFR, Estimated: 16 mL/min — ABNORMAL LOW (ref >60)
Glucose, Bld: 137 mg/dL — ABNORMAL HIGH (ref 70–99)
Potassium: 4.6 mmol/L (ref 3.5–5.1)
Sodium: 136 mmol/L (ref 135–145)
Total Bilirubin: 0.5 mg/dL (ref 0.0–1.2)
Total Protein: 6.9 g/dL (ref 6.5–8.1)

## 2024-01-11 LAB — BLOOD GAS, VENOUS
Acid-base deficit: 5.2 mmol/L — ABNORMAL HIGH (ref 0.0–2.0)
Bicarbonate: 20 mmol/L (ref 20.0–28.0)
O2 Saturation: 86.5 %
Patient temperature: 37
pCO2, Ven: 37 mmHg — ABNORMAL LOW (ref 44–60)
pH, Ven: 7.34 (ref 7.25–7.43)
pO2, Ven: 53 mmHg — ABNORMAL HIGH (ref 32–45)

## 2024-01-11 LAB — RESP PANEL BY RT-PCR (RSV, FLU A&B, COVID)  RVPGX2
Influenza A by PCR: NEGATIVE
Influenza B by PCR: NEGATIVE
Resp Syncytial Virus by PCR: NEGATIVE
SARS Coronavirus 2 by RT PCR: NEGATIVE

## 2024-01-11 LAB — LACTIC ACID, PLASMA: Lactic Acid, Venous: 1.3 mmol/L (ref 0.5–1.9)

## 2024-01-11 LAB — TROPONIN I (HIGH SENSITIVITY)
Troponin I (High Sensitivity): 12 ng/L (ref <18)
Troponin I (High Sensitivity): 10 ng/L (ref <18)

## 2024-01-11 LAB — BRAIN NATRIURETIC PEPTIDE: B Natriuretic Peptide: 83.6 pg/mL (ref 0.0–100.0)

## 2024-01-11 LAB — PROCALCITONIN: Procalcitonin: 8.01 ng/mL

## 2024-01-11 MED ORDER — SODIUM CHLORIDE 0.9 % IV SOLN
2.0000 g | INTRAVENOUS | Status: DC
  Administered 2024-01-12 – 2024-01-14 (×3): 2 g via INTRAVENOUS
  Filled 2024-01-11 (×3): qty 20

## 2024-01-11 MED ORDER — ACETAMINOPHEN 500 MG PO TABS
1000.0000 mg | ORAL_TABLET | Freq: Once | ORAL | Status: AC
  Administered 2024-01-11: 1000 mg via ORAL
  Filled 2024-01-11: qty 2

## 2024-01-11 MED ORDER — HEPARIN SODIUM (PORCINE) 5000 UNIT/ML IJ SOLN
5000.0000 [IU] | Freq: Three times a day (TID) | INTRAMUSCULAR | Status: DC
  Administered 2024-01-11 – 2024-01-14 (×9): 5000 [IU] via SUBCUTANEOUS
  Filled 2024-01-11 (×9): qty 1

## 2024-01-11 MED ORDER — CEFTRIAXONE SODIUM 2 G IJ SOLR
2.0000 g | Freq: Once | INTRAMUSCULAR | Status: AC
  Administered 2024-01-11: 2 g via INTRAVENOUS
  Filled 2024-01-11: qty 20

## 2024-01-11 MED ORDER — HYDROXYZINE HCL 10 MG PO TABS
10.0000 mg | ORAL_TABLET | Freq: Two times a day (BID) | ORAL | Status: DC | PRN
Start: 2024-01-11 — End: 2024-01-14
  Administered 2024-01-12 – 2024-01-13 (×4): 10 mg via ORAL
  Filled 2024-01-11 (×5): qty 1

## 2024-01-11 MED ORDER — SODIUM CHLORIDE 0.9 % IV SOLN
500.0000 mg | Freq: Once | INTRAVENOUS | Status: AC
  Administered 2024-01-11: 500 mg via INTRAVENOUS
  Filled 2024-01-11: qty 5

## 2024-01-11 MED ORDER — TRAZODONE HCL 50 MG PO TABS
50.0000 mg | ORAL_TABLET | Freq: Every day | ORAL | Status: DC
  Administered 2024-01-11 – 2024-01-13 (×3): 50 mg via ORAL
  Filled 2024-01-11 (×3): qty 1

## 2024-01-11 MED ORDER — GABAPENTIN 300 MG PO CAPS
300.0000 mg | ORAL_CAPSULE | Freq: Three times a day (TID) | ORAL | Status: DC
  Administered 2024-01-11 – 2024-01-14 (×9): 300 mg via ORAL
  Filled 2024-01-11 (×9): qty 1

## 2024-01-11 MED ORDER — SODIUM CHLORIDE 0.9 % IV SOLN: INTRAVENOUS | Status: AC

## 2024-01-11 MED ORDER — PREDNISONE 20 MG PO TABS
40.0000 mg | ORAL_TABLET | Freq: Every day | ORAL | Status: DC
  Administered 2024-01-11 – 2024-01-14 (×4): 40 mg via ORAL
  Filled 2024-01-11 (×4): qty 2

## 2024-01-11 MED ORDER — BUPROPION HCL ER (XL) 150 MG PO TB24
300.0000 mg | ORAL_TABLET | Freq: Every day | ORAL | Status: DC
  Administered 2024-01-11 – 2024-01-14 (×4): 300 mg via ORAL
  Filled 2024-01-11 (×4): qty 2

## 2024-01-11 MED ORDER — IPRATROPIUM-ALBUTEROL 0.5-2.5 (3) MG/3ML IN SOLN
6.0000 mL | Freq: Once | RESPIRATORY_TRACT | Status: AC
  Administered 2024-01-11: 6 mL via RESPIRATORY_TRACT
  Filled 2024-01-11: qty 3

## 2024-01-11 MED ORDER — SODIUM CHLORIDE 0.9 % IV BOLUS
1000.0000 mL | Freq: Once | INTRAVENOUS | Status: AC
  Administered 2024-01-11: 1000 mL via INTRAVENOUS

## 2024-01-11 MED ORDER — AZITHROMYCIN 500 MG PO TABS
500.0000 mg | ORAL_TABLET | Freq: Every day | ORAL | Status: DC
  Administered 2024-01-12 – 2024-01-14 (×3): 500 mg via ORAL
  Filled 2024-01-11 (×3): qty 1

## 2024-01-11 MED ORDER — LEVOTHYROXINE SODIUM 50 MCG PO TABS
50.0000 ug | ORAL_TABLET | Freq: Every day | ORAL | Status: DC
  Administered 2024-01-12 – 2024-01-14 (×3): 50 ug via ORAL
  Filled 2024-01-11 (×3): qty 1

## 2024-01-11 MED ORDER — ONDANSETRON HCL 4 MG PO TABS: 4.0000 mg | ORAL_TABLET | Freq: Four times a day (QID) | ORAL | Status: DC | PRN

## 2024-01-11 MED ORDER — ATORVASTATIN CALCIUM 20 MG PO TABS
20.0000 mg | ORAL_TABLET | Freq: Every day | ORAL | Status: DC
  Administered 2024-01-11 – 2024-01-14 (×4): 20 mg via ORAL
  Filled 2024-01-11 (×4): qty 1

## 2024-01-11 MED ORDER — NICOTINE 21 MG/24HR TD PT24
21.0000 mg | MEDICATED_PATCH | Freq: Every day | TRANSDERMAL | Status: DC
  Administered 2024-01-11 – 2024-01-14 (×4): 21 mg via TRANSDERMAL
  Filled 2024-01-11 (×4): qty 1

## 2024-01-11 MED ORDER — ACETAMINOPHEN 650 MG RE SUPP: 650.0000 mg | Freq: Four times a day (QID) | RECTAL | Status: DC | PRN

## 2024-01-11 MED ORDER — BUDESON-GLYCOPYRROL-FORMOTEROL 160-9-4.8 MCG/ACT IN AERO
2.0000 | INHALATION_SPRAY | Freq: Two times a day (BID) | RESPIRATORY_TRACT | Status: DC
  Administered 2024-01-11 – 2024-01-14 (×6): 2 via RESPIRATORY_TRACT
  Filled 2024-01-11 (×2): qty 5.9

## 2024-01-11 MED ORDER — ACETAMINOPHEN 325 MG PO TABS
650.0000 mg | ORAL_TABLET | Freq: Four times a day (QID) | ORAL | Status: DC | PRN
  Administered 2024-01-13: 650 mg via ORAL
  Filled 2024-01-11: qty 2

## 2024-01-11 MED ORDER — ALBUTEROL SULFATE (2.5 MG/3ML) 0.083% IN NEBU: 2.5000 mg | INHALATION_SOLUTION | Freq: Four times a day (QID) | RESPIRATORY_TRACT | Status: DC | PRN

## 2024-01-11 MED ORDER — IPRATROPIUM-ALBUTEROL 0.5-2.5 (3) MG/3ML IN SOLN
3.0000 mL | Freq: Four times a day (QID) | RESPIRATORY_TRACT | Status: DC
  Administered 2024-01-11 – 2024-01-12 (×6): 3 mL via RESPIRATORY_TRACT
  Filled 2024-01-11 (×5): qty 3

## 2024-01-11 MED ORDER — ONDANSETRON HCL 4 MG/2ML IJ SOLN: 4.0000 mg | Freq: Four times a day (QID) | INTRAMUSCULAR | Status: DC | PRN

## 2024-01-11 NOTE — ED Notes (Signed)
 Pt not tolerating BiPAP, MD aware, pt transitioned to 6L [place].

## 2024-01-11 NOTE — ED Notes (Signed)
 Pt assisted to the restroom and back into bed. Provided with dinner tray, denies any additional needs at this time, NAD noted, call bell within reach

## 2024-01-11 NOTE — ED Triage Notes (Signed)
 Pt presented to ED BIBA from home with c/o shortness of breath since Thursday. Hx COPD. Wheezing noted. Per EMS, 78% on room air, according to fire 62% on RA, placed on 15L NRB and removed prior to EMS arrival. Placed on neb tx with EMS, up to 100% on 6LPM. Productive cough. BGL 125, 98.8 oral, HR 120.  18LAC  1 duoneb,  125mg  solumedrol

## 2024-01-11 NOTE — Progress Notes (Signed)
 CODE SEPSIS - PHARMACY COMMUNICATION  **Broad Spectrum Antibiotics should be administered within 1 hour of Sepsis diagnosis**  Time Code Sepsis Called/Page Received: 9456  Antibiotics Ordered: Ceftriaxone & Azithromycin   Time of 1st antibiotic administration: 0557  Rankin CANDIE Dills, PharmD, [facility] 01/11/2024 6:12 AM

## 2024-01-11 NOTE — ED Notes (Signed)
 Other medications have still not been reconciled. Pharmacy is aware,

## 2024-01-11 NOTE — ED Notes (Signed)
 Messaged pharmacy to verify patient's medications on [facility]; pharmacy states they are waiting on Med rec to be completed. Will medicate per [employer] once all meds are verified.

## 2024-01-11 NOTE — ED Notes (Signed)
 Pt assisted to restroom and then back into bed. Provided with a fan and pillow per request, denies any additional needs at this time, NAD noted, call bell within reach, stretcher in lowest, locked position.

## 2024-01-11 NOTE — H&P (Addendum)
 History and Physical    Kristen Mills:[id number] DOB: 1950-09-14 DOA: 01/11/2024  PCP: Kristen Melanie DASEN, NP (Confirm with patient/family/NH records and if not entered, this has to be entered at [facility] point of entry) Patient coming from: Home  I have personally briefly reviewed patient's old medical records in [facility]  Chief Complaint: Cough, wheezing, shortness of breath  HPI: Kristen Mills is a 73 y.o. female with medical history significant of COPD Gold stage II, HTN, HLD, CKD stage II, cocaine abuse, cigarette smoking, presented with worsening of shortness of breath, cough and wheezing.  Symptoms started about 1 week ago, patient started to have runny nose with  sinus problem comes back, then she started develop productive cough with yellowish sputum and wheezing.  Symptoms gradually getting worse to despite using her regular inhalers.  Denies any chest pain, no fever or chills.  Feeling malaise nausea yesterday and did not eat or drink much became dehydrated.  Denied any abdominal pain no diarrhea.  Could not sleep at all at night and called EMS herself, EMS arrived and found patient O2 saturation 62% on room air, patient was placed on NRB, and was given Solu-Medrol 125, DuoNebs. ED Course: Temperature 99.2 tachycardia heart rates 118, tachypneic 40 rate 27, blood pressure 117/68 O2 saturation 98% on 5 L.  VBG 7.30 4/37/53, WBC 17.4 hemoglobin 11.8 BUN 54 creatinine 2.9 compared to baseline 1.4.  Patient did not tolerate BiPAP in the ED.  Her breathing status significantly improvement after given DuoNebs and antibiotics.  Chest x-ray showed signs of multifocal pneumonia.  Review of Systems: As per HPI otherwise 14 point review of systems negative.    Past Medical History:  Diagnosis Date   Asthma    Cancer (HCC)    Uterine Cancer   COPD (chronic obstructive pulmonary disease) (HCC)    Depression    Diabetes mellitus without complication (HCC)    Hyperlipidemia     Hypertension    Insomnia    Polysubstance abuse (HCC)    Spinal stenosis    Thyroid  condition     Past Surgical History:  Procedure Laterality Date   ABDOMINAL HYSTERECTOMY     CATARACT EXTRACTION Bilateral    12/26/22 and 01/09/23     reports that she has been smoking cigarettes. She has a 45 pack-year smoking history. She has never used smokeless tobacco. She reports current alcohol  use of about 3.0 standard drinks of alcohol  per week. She reports current drug use. Drugs: Crack cocaine, Marijuana, and Cocaine.  No Known Allergies  Family History  Problem Relation Age of Onset   Cancer Mother        Lung   Cancer Father        Lung   Heart disease Sister    Heart attack Sister    Cancer Brother        Lung   Bipolar disorder Son      Prior to Admission medications   Medication Sig Start Date End Date Taking? Authorizing Provider  albuterol  (VENTOLIN  HFA) 108 (90 Base) MCG/ACT inhaler INHALE 2 PUFFS EVERY 4 TO 6 HOURS AS NEEDED FOR SHORTNESS OF BREATH 08/07/23   Cannady, Jolene T, NP  atorvastatin  (LIPITOR) 20 MG tablet Take 1 tablet (20 mg total) by mouth daily. 08/07/23   Cannady, Jolene T, NP  Budeson-Glycopyrrol-Formoterol (BREZTRI AEROSPHERE) 160-9-4.8 MCG/ACT AERO Inhale 2 puffs into the lungs in the morning and at bedtime.    [provider]  buPROPion  (WELLBUTRIN   XL) 300 MG 24 hr tablet Take 1 tablet (300 mg total) by mouth daily. 08/07/23   Cannady, Jolene T, NP  Cholecalciferol  1.25 MG (50000 UT) capsule Take 1 capsule (50,000 Units total) by mouth once a week. 08/07/23   Cannady, Jolene T, NP  gabapentin  (NEURONTIN ) 300 MG capsule Take 1 capsule (300 mg total) by mouth 3 (three) times daily. 07/25/23   Cannady, Jolene T, NP  hydrOXYzine  (ATARAX ) 10 MG tablet Take 1 tablet (10 mg total) by mouth 2 (two) times daily as needed. 07/25/23   Cannady, Jolene T, NP  levothyroxine  (SYNTHROID ) 50 MCG tablet Take 1 tablet (50 mcg total) by mouth daily. 08/07/23   Cannady,  Jolene T, NP  telmisartan  (MICARDIS ) 20 MG tablet Take 1 tablet (20 mg total) by mouth daily. 08/07/23   Cannady, Jolene T, NP  traZODone  (DESYREL ) 50 MG tablet Take 1 tablet (50 mg total) by mouth at bedtime. 07/25/23   Kristen Moris T, NP    Physical Exam: Vitals:   01/11/24 0600 01/11/24 0627 01/11/24 0630 01/11/24 0730  BP: 122/73  106/66 101/73  Pulse: (!) 118 (!) 104 (!) 109 100  Resp: (!) 28 (!) 27 (!) 25 18  Temp:      TempSrc:      SpO2: 99% 100% 100% 100%  Weight:      Height:        Constitutional: NAD, calm, comfortable Vitals:   01/11/24 0600 01/11/24 0627 01/11/24 0630 01/11/24 0730  BP: 122/73  106/66 101/73  Pulse: (!) 118 (!) 104 (!) 109 100  Resp: (!) 28 (!) 27 (!) 25 18  Temp:      TempSrc:      SpO2: 99% 100% 100% 100%  Weight:      Height:       Eyes: PERRL, lids and conjunctivae normal ENMT: Mucous membranes are dry. Posterior pharynx clear of any exudate or lesions.Normal dentition.  Neck: normal, supple, no masses, no thyromegaly Respiratory: clear to auscultation bilaterally, scattered wheezing, scattered crackles bilaterally, increasing respiratory effort. No accessory muscle use.  Cardiovascular: Regular rate and rhythm, no murmurs / rubs / gallops. No extremity edema. 2+ pedal pulses. No carotid bruits.  Abdomen: no tenderness, no masses palpated. No hepatosplenomegaly. Bowel sounds positive.  Musculoskeletal: no clubbing / cyanosis. No joint deformity upper and lower extremities. Good ROM, no contractures. Normal muscle tone.  Skin: no rashes, lesions, ulcers. No induration Neurologic: CN 2-12 grossly intact. Sensation intact, DTR normal. Strength 5/5 in all 4.  Psychiatric: Normal judgment and insight. Alert and oriented x 3. Normal mood.   Labs on Admission: I have personally reviewed following labs and imaging studies  CBC: Recent Labs  Lab 01/11/24 0502  WBC 17.4*  NEUTROABS 13.4*  HGB 11.8*  HCT 36.4  MCV 90.5  PLT 287   Basic  Metabolic Panel: Recent Labs  Lab 01/11/24 0502  NA 136  K 4.6  CL 100  CO2 20*  GLUCOSE 137*  BUN 54*  CREATININE 2.96*  CALCIUM  8.5*   GFR: Estimated Creatinine Clearance: 13.8 mL/min (A) (by C-G formula based on SCr of 2.96 mg/dL (H)). Liver Function Tests: Recent Labs  Lab 01/11/24 0502  AST 20  ALT 11  ALKPHOS 67  BILITOT 0.5  PROT 6.9  ALBUMIN 3.1*   No results for input(s): LIPASE, AMYLASE in the last 168 hours. No results for input(s): AMMONIA in the last 168 hours. Coagulation Profile: No results for input(s): INR, PROTIME in the last  168 hours. Cardiac Enzymes: No results for input(s): CKTOTAL, CKMB, CKMBINDEX, TROPONINI in the last 168 hours. BNP (last 3 results) No results for input(s): PROBNP in the last 8760 hours. HbA1C: No results for input(s): HGBA1C in the last 72 hours. CBG: No results for input(s): GLUCAP in the last 168 hours. Lipid Profile: No results for input(s): CHOL, HDL, LDLCALC, TRIG, CHOLHDL, LDLDIRECT in the last 72 hours. Thyroid  Function Tests: No results for input(s): TSH, T4TOTAL, FREET4, T3FREE, THYROIDAB in the last 72 hours. Anemia Panel: No results for input(s): VITAMINB12, FOLATE, FERRITIN, TIBC, IRON, RETICCTPCT in the last 72 hours. Urine analysis:    Component Value Date/Time   COLORURINE YELLOW (A) 01/11/2024 0550   APPEARANCEUR CLOUDY (A) 01/11/2024 0550   APPEARANCEUR Cloudy (A) 07/21/2020 1546   LABSPEC 1.016 01/11/2024 0550   PHURINE 5.0 01/11/2024 0550   GLUCOSEU NEGATIVE 01/11/2024 0550   HGBUR NEGATIVE 01/11/2024 0550   BILIRUBINUR NEGATIVE 01/11/2024 0550   BILIRUBINUR Negative 07/21/2020 1546   KETONESUR NEGATIVE 01/11/2024 0550   PROTEINUR 30 (A) 01/11/2024 0550   NITRITE NEGATIVE 01/11/2024 0550   LEUKOCYTESUR NEGATIVE 01/11/2024 0550    Radiological Exams on Admission: DG Chest Portable 1 View Result Date: 01/11/2024 CLINICAL DATA:   Shortness of breath. EXAM: PORTABLE CHEST 1 VIEW COMPARISON:  10/07/2013 FINDINGS: Lungs are hyperexpanded. Patchy and nodular asymmetric airspace opacity identified in the lungs, right greater than left. No pulmonary edema or pleural effusion. The cardiopericardial silhouette is within normal limits for size. No acute bony abnormality. Telemetry leads overlie the chest. IMPRESSION: Patchy and nodular asymmetric airspace opacity in the lungs, right greater than left. Imaging features are compatible with multifocal pneumonia. Close follow-up recommended to ensure resolution. Electronically Signed   By: Camellia Candle M.D.   On: 01/11/2024 05:29    EKG: Independently reviewed.  Sinus tachycardia, no acute ST changes.  Assessment/Plan Principal Problem:   PNA (pneumonia) Active Problems:   CAP (community acquired pneumonia)   Sepsis (HCC)  (please populate well all problems here in Problem List. (For example, if patient is on BP meds at home and you resume or decide to hold them, it is a problem that needs to be her. Same for CAD, COPD, HLD and so on)  Sepsis with acute organ damage, improving - Sepsis as evidenced by tachycardia, tachypnea,, acute hypoxic respiratory failure, source infection is multifocal pneumonia - Received IV bolus x 2 L in the ED, - Continue antibiotics ceftriaxone and azithromycin   Acute hypoxic respiratory failure, improving - Secondary to COPD and pneumonia, management as below.  Multifocal pneumonia - Likely secondary to aspiration from sinusitis and postnasal drip. - Continue ceftriaxone and azithromycin  - Incentive spirometry and flutter valve - Repeat chest x-ray in 4 weeks to document resolution  Acute COPD exacerbation - P.o. prednisone  - Continue ICS and LABA - DuoNebs and as needed albuterol    AKI on CKD stage II - Prerenal secondary to sepsis and poor oral intake - Clinically still appears to be volume contracted - Hold off on BP meds - Continue  maintenance IVF  Hypothyroidism - Continue Synthroid   Cigarette smoking - Cessation education performed at bedside - Nicotine patch  Cocaine abuse - Denied any chest pain, troponin negative x 2. - No chest pains, treat COPD and pneumonia and then reevaluate.  Anxiety/depression - Mentation at baseline, continue SSRI  DVT prophylaxis: Heparin subcu Code Status: Full code Family Communication: None at bedside Disposition Plan: Patient is sick with sepsis requiring IV antibiotics, expect more  than 2 midnight hospital stay Consults called: None Admission status: Telemetry admission   Cort ONEIDA Mana MD [facility] Pager [phone]  01/11/2024, 7:58 AM

## 2024-01-11 NOTE — ED Notes (Signed)
 Pt eating lunch, denies any additional needs at this time, call bell within reach, NAD noted

## 2024-01-11 NOTE — ED Notes (Signed)
 Pt sitting up in bed eating, denies any needs at this time, NAD noted

## 2024-01-11 NOTE — ED Provider Notes (Signed)
 [facility] Provider Note    Event Date/Time   First MD Initiated Contact with Patient 01/11/24 0448     (approximate)   History   Shortness of Breath   HPI  Kristen Mills is a 73 y.o. female with history of COPD, prior cocaine abuse, hypertension, CKD but no inhalers at home who comes in with concerns for shortness of breath.  Patient has had shortness of breath since Thursday.  She did have some wheezing on examination and EMS found her to be hypoxic to the 60s placed on nonrebreather.  Patient was given neb and converted to 6 L.  She does report having a productive cough and some greenish sputum with feeling like she has an infection in her lung.  Patient was given 1 DuoNeb, Solu-Medrol on route.  She denies any history of blood clots, heart attacks.  Denies any swelling in her legs.  Physical Exam   Triage Vital Signs: ED Triage Vitals  Encounter Vitals Group     BP 01/11/24 0453 117/68     Girls Systolic BP Percentile --      Girls Diastolic BP Percentile --      Boys Systolic BP Percentile --      Boys Diastolic BP Percentile --      Pulse Rate 01/11/24 0453 (!) 125     Resp 01/11/24 0453 (!) 27     Temp 01/11/24 0453 99.2 F (37.3 C)     Temp Source 01/11/24 0453 Oral     SpO2 01/11/24 0452 99 %     Weight 01/11/24 0453 125 lb 10.6 oz (57 kg)     Height 01/11/24 0453 5' (1.524 m)     Head Circumference --      Peak Flow --      Pain Score 01/11/24 0453 0     Pain Loc --      Pain Education --      Exclude from Growth Chart --     Most recent vital signs: Vitals:   01/11/24 0453 01/11/24 0500  BP: 117/68 99/63  Pulse: (!) 125 (!) 118  Resp: (!) 27 (!) 22  Temp: 99.2 F (37.3 C)   SpO2: 99% 99%     General: Awake, no distress.  CV:  Good peripheral perfusion.  Tachycardic Resp:  Normal effort.  Tight air exchange with slight wheeze noted Abd:  No distention.  Soft and nontender Other:  No swelling in leg.  No calf  tenderness   ED Results / Procedures / Treatments   Labs (all labs ordered are listed, but only abnormal results are displayed) Labs Reviewed  BLOOD GAS, VENOUS - Abnormal; Notable for the following components:      Result Value   pCO2, Ven 37 (*)    pO2, Ven 53 (*)    Acid-base deficit 5.2 (*)    All other components within normal limits  CBC WITH DIFFERENTIAL/PLATELET  COMPREHENSIVE METABOLIC PANEL WITH GFR  BRAIN NATRIURETIC PEPTIDE  TROPONIN I (HIGH SENSITIVITY)     EKG  My interpretation of EKG:  Sinus tachycardia rate of 122 without any ST elevation or T wave inversions, normal levels  RADIOLOGY I have reviewed the xray personally and interpreted the patchiness bilaterally that I suspect is more likely pneumonia   PROCEDURES:  Critical Care performed: Yes, see critical care procedure note(s)  .1-3 Lead EKG Interpretation  Performed by: Ernest Ronal BRAVO, MD Authorized by: Ernest Ronal BRAVO, MD  Interpretation: abnormal     ECG rate:  118   ECG rate assessment: tachycardic     Rhythm: sinus tachycardia     Ectopy: none     Conduction: normal   .Critical Care  Performed by: Ernest Ronal BRAVO, MD Authorized by: Ernest Ronal BRAVO, MD   Critical care provider statement:    Critical care time (minutes):  30   Critical care was necessary to treat or prevent imminent or life-threatening deterioration of the following conditions:  Respiratory failure   Critical care was time spent personally by me on the following activities:  Development of treatment plan with patient or surrogate, discussions with consultants, evaluation of patient's response to treatment, examination of patient, ordering and review of laboratory studies, ordering and review of radiographic studies, ordering and performing treatments and interventions, pulse oximetry, re-evaluation of patient's condition and review of old charts    MEDICATIONS ORDERED IN ED: Medications  azithromycin  (ZITHROMAX ) 500 mg in  sodium chloride 0.9 % 250 mL IVPB (500 mg Intravenous New Bag/Given 01/11/24 0625)  sodium chloride 0.9 % bolus 1,000 mL (0 mLs Intravenous Stopped 01/11/24 0626)  ipratropium-albuterol  (DUONEB) 0.5-2.5 (3) MG/3ML nebulizer solution 6 mL (6 mLs Nebulization Given 01/11/24 0508)  cefTRIAXone (ROCEPHIN) 2 g in sodium chloride 0.9 % 100 mL IVPB (0 g Intravenous Stopped 01/11/24 0626)  sodium chloride 0.9 % bolus 1,000 mL (1,000 mLs Intravenous New Bag/Given 01/11/24 0559)  acetaminophen  (TYLENOL ) tablet 1,000 mg (1,000 mg Oral Given 01/11/24 0556)     IMPRESSION / MDM / ASSESSMENT AND PLAN / ED COURSE  I reviewed the triage vital signs and the nursing notes.   Patient's presentation is most consistent with acute presentation with potential threat to life or bodily function.   Patient comes in hypoxic with EMS with significant work of breathing and tight air exchange therefore given my concern for potential COPD exacerbation patient was placed on BiPAP to see if this would help her be more comfortable with breathing.  To get VBG and workup to evaluate for ACS, pneumonia, CHF.  Patient does have slightly low blood pressure and has no evidence of swelling on exam so I do suspect that CHF is less likely and I will give a liter of fluids.  VBG is reassuring without evidence of hypercapnia  CBC shows elevated white count.  Patient met sepsis criteria therefore sepsis alert was called.  Her CMP does show new AKI.  Her chest x-ray shows concerns for bilateral pneumonia.  Patient was ordered full fluid resuscitation.  I repeat evaluation her heart rates have come down and her work of breathing has improved however she does not like the BiPAP.  Due to poor seal it keeps beeping and given she seems to be improving overall with normal VBG without hypercapnia patient was placed back on 6 L.  Patient was down try titrated to 4 L and seems to be tolerating well.  I will discuss with the hospital team for  admission  The patient is on the cardiac monitor to evaluate for evidence of arrhythmia and/or significant heart rate changes.      FINAL CLINICAL IMPRESSION(S) / ED DIAGNOSES   Final diagnoses:  Acute respiratory failure with hypoxia (HCC)  COPD exacerbation (HCC)  Pneumonia due to infectious organism, unspecified laterality, unspecified part of lung  AKI (acute kidney injury)     Rx / DC Orders   ED Discharge Orders     None        Note:  This document was prepared using Dragon voice recognition software and may include unintentional dictation errors.   Ernest Ronal BRAVO, MD 01/11/24 [phone]

## 2024-01-11 NOTE — Sepsis Progress Note (Signed)
 Elink monitoring for the code sepsis protocol.

## 2024-01-12 DIAGNOSIS — I10 Essential (primary) hypertension: Secondary | ICD-10-CM

## 2024-01-12 DIAGNOSIS — J9601 Acute respiratory failure with hypoxia: Secondary | ICD-10-CM | POA: Diagnosis not present

## 2024-01-12 DIAGNOSIS — Z87891 Personal history of nicotine dependence: Secondary | ICD-10-CM

## 2024-01-12 DIAGNOSIS — F1910 Other psychoactive substance abuse, uncomplicated: Secondary | ICD-10-CM

## 2024-01-12 DIAGNOSIS — F32A Depression, unspecified: Secondary | ICD-10-CM

## 2024-01-12 DIAGNOSIS — E782 Mixed hyperlipidemia: Secondary | ICD-10-CM

## 2024-01-12 DIAGNOSIS — F419 Anxiety disorder, unspecified: Secondary | ICD-10-CM

## 2024-01-12 DIAGNOSIS — A419 Sepsis, unspecified organism: Secondary | ICD-10-CM | POA: Diagnosis not present

## 2024-01-12 DIAGNOSIS — E039 Hypothyroidism, unspecified: Secondary | ICD-10-CM

## 2024-01-12 DIAGNOSIS — J441 Chronic obstructive pulmonary disease with (acute) exacerbation: Secondary | ICD-10-CM | POA: Diagnosis not present

## 2024-01-12 DIAGNOSIS — N1832 Chronic kidney disease, stage 3b: Secondary | ICD-10-CM

## 2024-01-12 DIAGNOSIS — R6520 Severe sepsis without septic shock: Secondary | ICD-10-CM

## 2024-01-12 DIAGNOSIS — N179 Acute kidney failure, unspecified: Secondary | ICD-10-CM

## 2024-01-12 DIAGNOSIS — J189 Pneumonia, unspecified organism: Secondary | ICD-10-CM | POA: Diagnosis not present

## 2024-01-12 LAB — BASIC METABOLIC PANEL WITH GFR
Anion gap: 15 (ref 5–15)
BUN: 44 mg/dL — ABNORMAL HIGH (ref 8–23)
CO2: 17 mmol/L — ABNORMAL LOW (ref 22–32)
Calcium: 8.2 mg/dL — ABNORMAL LOW (ref 8.9–10.3)
Chloride: 112 mmol/L — ABNORMAL HIGH (ref 98–111)
Creatinine, Ser: 1.72 mg/dL — ABNORMAL HIGH (ref 0.44–1.00)
GFR, Estimated: 31 mL/min — ABNORMAL LOW
Glucose, Bld: 153 mg/dL — ABNORMAL HIGH (ref 70–99)
Potassium: 4 mmol/L (ref 3.5–5.1)
Sodium: 142 mmol/L (ref 135–145)

## 2024-01-12 LAB — BLOOD CULTURE ID PANEL (REFLEXED) - BCID2

## 2024-01-12 LAB — CBC
HCT: 30.8 % — ABNORMAL LOW (ref 36.0–46.0)
Hemoglobin: 10.2 g/dL — ABNORMAL LOW (ref 12.0–15.0)
MCH: 30.4 pg (ref 26.0–34.0)
MCHC: 33.1 g/dL (ref 30.0–36.0)
MCV: 91.9 fL (ref 80.0–100.0)
Platelets: 302 K/uL (ref 150–400)
RBC: 3.35 MIL/uL — ABNORMAL LOW (ref 3.87–5.11)
RDW: 13.2 % (ref 11.5–15.5)
WBC: 16.4 K/uL — ABNORMAL HIGH (ref 4.0–10.5)
nRBC: 0 % (ref 0.0–0.2)

## 2024-01-12 LAB — EXPECTORATED SPUTUM ASSESSMENT W GRAM STAIN, RFLX TO RESP C

## 2024-01-12 LAB — STREP PNEUMONIAE URINARY ANTIGEN: Strep Pneumo Urinary Antigen: NEGATIVE

## 2024-01-12 MED ORDER — IPRATROPIUM-ALBUTEROL 0.5-2.5 (3) MG/3ML IN SOLN
3.0000 mL | Freq: Three times a day (TID) | RESPIRATORY_TRACT | Status: DC
  Administered 2024-01-12 – 2024-01-14 (×5): 3 mL via RESPIRATORY_TRACT
  Filled 2024-01-12 (×5): qty 3

## 2024-01-12 MED ORDER — LACTATED RINGERS IV SOLN: INTRAVENOUS | Status: AC

## 2024-01-12 MED ORDER — HYDRALAZINE HCL 25 MG PO TABS: 25.0000 mg | ORAL_TABLET | Freq: Three times a day (TID) | ORAL | Status: DC | PRN

## 2024-01-12 NOTE — Assessment & Plan Note (Signed)
 Continue home Synthroid

## 2024-01-12 NOTE — TOC Initial Note (Addendum)
 Transition of Care [facility]) - Initial/Assessment Note    Patient Details  Name: Kristen Mills MRN: [id number] Date of Birth: February 21, 1951  Transition of [facility]) CM/SW Contact:    Lauraine JAYSON Carpen, LCSW Phone Number: 01/12/2024, 2:32 PM  Clinical Narrative:    Readmission prevention screen complete. CSW met with patient. No family at bedside. CSW introduced role and explained that discharge planning would be discussed. PCP is Jolene Cannady, NP. Her neighbor drives her to appointments. Pharmacy is [facility]. No issues obtaining medications. Patient lives home alone. No home health or DME use prior to admission. She confirmed she does not wear oxygen at home. She is currently on 4 L. Will follow for this potential discharge need. SDOH flag for utilities. Resources added to AVS. TOC consult for medication assistance. Pharmacist is aware. No further concerns. CSW will continue to follow patient for support and facilitate return home once stable. Son or daughter-in-law will likely transport her home at discharge.              Expected Discharge Plan: Home/Self Care Barriers to Discharge: Continued Medical Work up   Patient Goals and CMS Choice            Expected Discharge Plan and Services     Post Acute Care Choice: NA Living arrangements for the past 2 months: Apartment                                      Prior Living Arrangements/Services Living arrangements for the past 2 months: Apartment Lives with:: Self Patient language and need for interpreter reviewed:: Yes Do you feel safe going back to the place where you live?: Yes      Need for Family Participation in Patient Care: Yes (Comment)     Criminal Activity/Legal Involvement Pertinent to Current Situation/Hospitalization: No - Comment as needed  Activities of Daily Living   ADL Screening (condition at time of admission) Independently performs ADLs?: Yes (appropriate for developmental age) Is  the patient deaf or have difficulty hearing?: Yes Does the patient have difficulty seeing, even when wearing glasses/contacts?: No Does the patient have difficulty concentrating, remembering, or making decisions?: No  Permission Sought/Granted                  Emotional Assessment Appearance:: Appears stated age Attitude/Demeanor/Rapport: Engaged, Gracious Affect (typically observed): Accepting, Appropriate, Calm, Pleasant Orientation: : Oriented to Self, Oriented to Place, Oriented to  Time, Oriented to Situation Alcohol  / Substance Use: Not Applicable Psych Involvement: No (comment)  Admission diagnosis:  COPD exacerbation (HCC) [J44.1] Acute respiratory failure with hypoxia (HCC) [J96.01] AKI (acute kidney injury) [N17.9] PNA (pneumonia) [J18.9] Pneumonia due to infectious organism, unspecified laterality, unspecified part of lung [J18.9] Patient Active Problem List   Diagnosis Date Noted   Acute hypoxic respiratory failure (HCC) 01/12/2024   Anxiety and depression 01/12/2024   AKI (acute kidney injury) 01/12/2024   Polysubstance abuse (HCC)    CAP (community acquired pneumonia) 01/11/2024   Sepsis (HCC) 01/11/2024   PNA (pneumonia) 01/11/2024   Other nonthrombocytopenic purpura 01/14/2021   Proteinuria, unspecified 01/14/2021   History of cigarette smoking 01/14/2021   Hypothyroidism 09/03/2020   COPD with acute exacerbation (HCC) 09/25/2019   Financial difficulties 08/14/2019   Chronic kidney disease (CKD), stage III (moderate) (HCC) 08/14/2019   Vitamin D  deficiency 12/11/2018   Simple chronic bronchitis (HCC) 08/15/2018  Cocaine abuse (HCC) 08/15/2018   Depression, major, recurrent, moderate (HCC) 08/15/2018   Elevated hemoglobin A1c 02/23/2017   Chronic hepatitis C without hepatic coma (HCC) 11/03/2015   Hyperlipidemia 04/09/2015   ETOH abuse 12/30/2014   Insomnia 12/30/2014   Spinal stenosis 11/20/2014   Essential hypertension 08/20/2014   Nicotine  dependence, cigarettes, w unsp disorders 09/24/2013   PCP:  Valerio Melanie DASEN, NP Pharmacy:   [facility] - [place], [place] - [address] [place] [address] Phone: [phone] Fax: [phone]  [facility] - [place], [place] - [address] [address] [place], [address] [place] [address] Phone: [phone] Fax: [phone]  [facility] - [place], [place] - [address] [place] [address] Phone: [phone] Fax: [phone]     Social Drivers of Health (SDOH) Social History: SDOH Screenings   Food Insecurity: No Food Insecurity (01/11/2024)  Housing: Low Risk  (01/11/2024)  Transportation Needs: No Transportation Needs (01/11/2024)  Utilities: At Risk (01/12/2024)  Alcohol  Screen: Low Risk  (01/20/2023)  Depression (PHQ2-9): Medium Risk (08/07/2023)  Financial Resource Strain: Medium Risk (01/20/2023)  Physical Activity: Inactive (01/20/2023)  Social Connections: Patient Unable To Answer (01/11/2024)  Stress: Stress Concern Present (01/20/2023)  Tobacco Use: High Risk (01/11/2024)  Health Literacy: Adequate Health Literacy (01/20/2023)   SDOH Interventions:     Readmission Risk Interventions    01/12/2024    2:29 PM  Readmission Risk Prevention Plan  Transportation Screening Complete  PCP or Specialist Appt within 3-5 Days Complete  Social Work Consult for Recovery Care Planning/Counseling Complete  Palliative Care Screening Not Applicable  Medication Review [employer]) Complete

## 2024-01-12 NOTE — Plan of Care (Signed)

## 2024-01-12 NOTE — Assessment & Plan Note (Signed)
 Patient met sepsis criteria with leukocytosis, tachycardia and tachypnea, likely due to multifocal pneumonia. Procalcitonin elevated, preliminary blood cultures negative. Pending sputum cultures and urinary antigen. - Continue with ceftriaxone and Zithromax  -Continue with supportive care

## 2024-01-12 NOTE — Assessment & Plan Note (Signed)
 Blood pressure mildly elevated. -Holding home irbesartan due to AKI -As needed hydralazine

## 2024-01-12 NOTE — Assessment & Plan Note (Signed)
 Currently on 3 L of oxygen with no baseline oxygen use.  Likely secondary to multifocal pneumonia causing COPD exacerbation.  -Continue supplemental oxygen-wean as tolerated

## 2024-01-12 NOTE — Discharge Instructions (Signed)
 Agency Name: [facility] Address: [address] [place], [place] [address] Phone: [phone] Website: [email or web address] Service(s) Offered: Housing services, self-sufficiency, congregate meal program, and individual development account program.  Agency Name: [employer] Address: [address] [place], [place] [address] Phone: [phone] Email: [email or web address] .org Website: [email or web address] Service(s) Offered: Housing the homeless, feeding the hungry, Company secretary, job and education related services.  Agency Name: [facility] Address: [address], [place], [place] [address] Phone: [phone] Email: [email or web address] Service(s) Offered: Counseling, problem pregnancy, advocacy for Hispanics, limited emergency financial assistance.  Agency Name: Department [employer] Social [employer] Address: [address] [place], [place] [address] Phone: [phone] Website: [email or web address] Service(s) Offered: Child support services; child welfare services; SNAP; Medicaid; work first family assistance; and aid with fuel,  rent, food and medicine.  Agency Name: [employer] Address: [address], [place], [place] [address] Phone: [phone] or [phone] Email: [email or web address] Service(s) Offered: Family services and transient assistance; emergency food, fuel, clothing, limited furniture, utilities; budget counseling, general counseling; give a kid a coat; thrift store; Christmas food and toys. Utility assistance, food pantry, rental  assistance, life sustaining medicine

## 2024-01-12 NOTE — Assessment & Plan Note (Signed)
 Continue statin

## 2024-01-12 NOTE — Assessment & Plan Note (Signed)
-   Continuing home as needed Atarax  and Wellbutrin

## 2024-01-12 NOTE — Assessment & Plan Note (Signed)
 No wheezing when seen today. -Continue with prednisone  -Continue with bronchodilators

## 2024-01-12 NOTE — Progress Notes (Signed)
 PHARMACY - PHYSICIAN COMMUNICATION CRITICAL VALUE ALERT - BLOOD CULTURE IDENTIFICATION (BCID)  Kristen Mills is an 73 y.o. female who presented to [facility] on 01/11/2024 with a chief complaint of SOB, wheezing, and cough. Patient has a PMH significant for COPD, smoking, HTN, prior cocaine use.  Assessment:  Lab called with blood cultures showing GPC's in 1/4 bottles. BCID panel detected staph species (not staph aureus). Per chart review, this seems more a contaminant. Patient is afebrile, and WBC is trending down on current antibiotics.   Name of physician (or Provider) Contacted: Dr. Caleen   Current antibiotics:  Azithromycin  500 mg PO daily Ceftriaxone 2 g IV daily  Changes to prescribed antibiotics recommended:  No changes needed to antibiotic regimen. Will continue to monitor blood cultures and signs of clinical improvement.  Results for orders placed or performed during the hospital encounter of 01/11/24  Blood Culture ID Panel (Reflexed) (Collected: 01/11/2024  5:50 AM)  Result Value Ref Range   Enterococcus faecalis NOT DETECTED NOT DETECTED   Enterococcus Faecium NOT DETECTED NOT DETECTED   Listeria monocytogenes NOT DETECTED NOT DETECTED   Staphylococcus species DETECTED (A) NOT DETECTED   Staphylococcus aureus (BCID) NOT DETECTED NOT DETECTED   Staphylococcus epidermidis NOT DETECTED NOT DETECTED   Staphylococcus lugdunensis NOT DETECTED NOT DETECTED   Streptococcus species NOT DETECTED NOT DETECTED   Streptococcus agalactiae NOT DETECTED NOT DETECTED   Streptococcus pneumoniae NOT DETECTED NOT DETECTED   Streptococcus pyogenes NOT DETECTED NOT DETECTED   A.calcoaceticus-baumannii NOT DETECTED NOT DETECTED   Bacteroides fragilis NOT DETECTED NOT DETECTED   Enterobacterales NOT DETECTED NOT DETECTED   Enterobacter cloacae complex NOT DETECTED NOT DETECTED   Escherichia coli NOT DETECTED NOT DETECTED   Klebsiella aerogenes NOT DETECTED NOT DETECTED   Klebsiella oxytoca  NOT DETECTED NOT DETECTED   Klebsiella pneumoniae NOT DETECTED NOT DETECTED   Proteus species NOT DETECTED NOT DETECTED   Salmonella species NOT DETECTED NOT DETECTED   Serratia marcescens NOT DETECTED NOT DETECTED   Haemophilus influenzae NOT DETECTED NOT DETECTED   Neisseria meningitidis NOT DETECTED NOT DETECTED   Pseudomonas aeruginosa NOT DETECTED NOT DETECTED   Stenotrophomonas maltophilia NOT DETECTED NOT DETECTED   Candida albicans NOT DETECTED NOT DETECTED   Candida auris NOT DETECTED NOT DETECTED   Candida glabrata NOT DETECTED NOT DETECTED   Candida krusei NOT DETECTED NOT DETECTED   Candida parapsilosis NOT DETECTED NOT DETECTED   Candida tropicalis NOT DETECTED NOT DETECTED   Cryptococcus neoformans/gattii NOT DETECTED NOT DETECTED    Ransom Blanch PGY-1 Pharmacy Resident  [facility] [facility]  01/12/2024 12:20 PM

## 2024-01-12 NOTE — Progress Notes (Signed)
 Progress Note   Patient: Kristen Mills FMW:[id number] DOB: [birth year] DOA: 01/11/2024     1 DOS: the patient was seen and examined on 01/12/2024   Brief hospital course: Partly taken from H&P.  Kristen Mills is a 73 y.o. female with medical history significant of COPD Gold stage II, HTN, HLD, CKD stage II, cocaine abuse, cigarette smoking, presented with worsening of shortness of breath, cough and wheezing for about 1 week.  When EMS arrived, they found oxygen saturation at 62% on room air, he was placed on nonrebreather and was given Solu-Medrol and DuoNeb.  On presentation patient was tachycardic and tachypneic, saturating 98% on 5 L, labs with VBG 7.30 4/37/53, WBC 17.4 hemoglobin 11.8 BUN 54 creatinine 2.9 compared to baseline 1.4.  Patient did not tolerate BiPAP. Chest x-ray concerning for multifocal pneumonia.  Patient was started on ceftriaxone and azithromycin .  10/17: Vital stable on 4 L of oxygen, small improvement in leukocytosis now at 16.4, hemoglobin 10.2, CO2 17, BUN 44, creatinine 1.72, preliminary blood cultures negative, UDS positive for cocaine, respiratory panel negative for COVID, influenza and RSV,  procalcitonin at 8.01.  Sputum culture and urinary antigens are still pending.  Assessment and Plan: * Sepsis [facility]) Patient met sepsis criteria with leukocytosis, tachycardia and tachypnea, likely due to multifocal pneumonia. Procalcitonin elevated, preliminary blood cultures negative. Pending sputum cultures and urinary antigen. - Continue with ceftriaxone and Zithromax  -Continue with supportive care  Acute hypoxic respiratory failure (HCC) Currently on 3 L of oxygen with no baseline oxygen use.  Likely secondary to multifocal pneumonia causing COPD exacerbation.  -Continue supplemental oxygen-wean as tolerated   COPD with acute exacerbation (HCC) No wheezing when seen today. -Continue with prednisone  -Continue with bronchodilators  AKI (acute kidney  injury) Patient had AKI with baseline history of CKD stage IIIb.  Creatinine with some improvement to 1.72 today.  Baseline seems to be around 1.3-1.4 -Continue with gentle IV fluid for another day -Monitor renal function -Avoid nephrotoxins  Essential hypertension Blood pressure mildly elevated. -Holding home irbesartan due to AKI -As needed hydralazine  Hypothyroidism - Continue home Synthroid   Anxiety and depression - Continuing home as needed Atarax  and Wellbutrin   Polysubstance abuse (HCC) UDS positive for cocaine. - Counseling was provided  Hyperlipidemia - Continue statin  History of cigarette smoking Counseling was provided. -Nicotine patch as needed   Subjective: Patient continued to have cough and mild pleuritic chest discomfort with coughing.  Physical Exam: Vitals:   01/11/24 2100 01/12/24 0335 01/12/24 0421 01/12/24 0734  BP:   (!) 151/83 (!) 140/74  Pulse:   (!) 105 99  Resp:   18 17  Temp:   98.2 F (36.8 C) 98.1 F (36.7 C)  TempSrc:   Oral Oral  SpO2: 99% 98% 99% 99%  Weight:      Height:       General.  Frail elderly lady, in no acute distress. Pulmonary.  Few scattered rhonchi bilaterally, normal respiratory effort. CV.  Regular rate and rhythm, no JVD, rub or murmur. Abdomen.  Soft, nontender, nondistended, BS positive. CNS.  Alert and oriented .  No focal neurologic deficit. Extremities.  No edema,  pulses intact and symmetrical. Psychiatry.  Judgment and insight appears normal.   Data Reviewed: Prior data reviewed  Family Communication: Tried calling son with no response  Disposition: Status is: Inpatient Remains inpatient appropriate because: Severity of illness  Planned Discharge Destination: Home  DVT prophylaxis.  Subcu heparin Time spent: 50 minutes  This  record has been created using Conservation officer, historic buildings. Errors have been sought and corrected,but may not always be located. Such creation errors do not reflect on  the standard of care.   Author: Amaryllis Dare, MD 01/12/2024 2:12 PM  For on call review [email or web address].

## 2024-01-12 NOTE — Assessment & Plan Note (Signed)
 UDS positive for cocaine. - Counseling was provided

## 2024-01-12 NOTE — Assessment & Plan Note (Signed)
 Patient had AKI with baseline history of CKD stage IIIb.  Creatinine with some improvement to 1.72 today.  Baseline seems to be around 1.3-1.4 -Continue with gentle IV fluid for another day -Monitor renal function -Avoid nephrotoxins

## 2024-01-12 NOTE — Assessment & Plan Note (Signed)
Counseling was provided. -Nicotine patch as needed 

## 2024-01-12 NOTE — Hospital Course (Addendum)
 Partly taken from H&P.  Kristen Mills is a 73 y.o. female with medical history significant of COPD Gold stage II, HTN, HLD, CKD stage II, cocaine abuse, cigarette smoking, presented with worsening of shortness of breath, cough and wheezing for about 1 week.  When EMS arrived, they found oxygen saturation at 62% on room air, he was placed on nonrebreather and was given Solu-Medrol and DuoNeb.  On presentation patient was tachycardic and tachypneic, saturating 98% on 5 L, labs with VBG 7.30 4/37/53, WBC 17.4 hemoglobin 11.8 BUN 54 creatinine 2.9 compared to baseline 1.4.  Patient did not tolerate BiPAP. Chest x-ray concerning for multifocal pneumonia.  Patient was started on ceftriaxone and azithromycin .  10/17: Vital stable on 4 L of oxygen, small improvement in leukocytosis now at 16.4, hemoglobin 10.2, CO2 17, BUN 44, creatinine 1.72, preliminary blood cultures negative, UDS positive for cocaine, respiratory panel negative for COVID, influenza and RSV,  procalcitonin at 8.01.  Sputum culture and urinary antigens are still pending.  10/18: Vital stable, currently on 2 L.  Slowly improving leukocytosis but still at 16, patient was also getting steroid, creatinine improved to 1.43.  Sputum cultures pending.  10/19: Hemodynamically stable.  Patient was ambulated with pulse ox and will need 3 to 4 L of oxygen with ambulation.  Her oxygen requirement improved at rest.  Sputum cultures growing Pseudomonas, antibiotics switched with Levaquin and she was given a 5-day course which she will take every 48 hours based on her renal function.  Patient will continue using oxygen and need to have follow-up with a pulmonologist for further management of her underlying COPD.  She should encouraged to stop smoking, we provided her with nicotine patch.  She will continue on current medications and need to have a close follow-up with her providers for further assistance.

## 2024-01-13 DIAGNOSIS — J441 Chronic obstructive pulmonary disease with (acute) exacerbation: Secondary | ICD-10-CM | POA: Diagnosis not present

## 2024-01-13 DIAGNOSIS — J9601 Acute respiratory failure with hypoxia: Secondary | ICD-10-CM | POA: Diagnosis not present

## 2024-01-13 DIAGNOSIS — J189 Pneumonia, unspecified organism: Secondary | ICD-10-CM | POA: Diagnosis not present

## 2024-01-13 DIAGNOSIS — A419 Sepsis, unspecified organism: Secondary | ICD-10-CM | POA: Diagnosis not present

## 2024-01-13 LAB — BASIC METABOLIC PANEL WITH GFR
Anion gap: 10 (ref 5–15)
BUN: 34 mg/dL — ABNORMAL HIGH (ref 8–23)
CO2: 22 mmol/L (ref 22–32)
Calcium: 8.9 mg/dL (ref 8.9–10.3)
Chloride: 110 mmol/L (ref 98–111)
Creatinine, Ser: 1.43 mg/dL — ABNORMAL HIGH (ref 0.44–1.00)
GFR, Estimated: 39 mL/min — ABNORMAL LOW (ref >60)
Glucose, Bld: 130 mg/dL — ABNORMAL HIGH (ref 70–99)
Potassium: 4.2 mmol/L (ref 3.5–5.1)
Sodium: 142 mmol/L (ref 135–145)

## 2024-01-13 LAB — CBC
HCT: 32.7 % — ABNORMAL LOW (ref 36.0–46.0)
Hemoglobin: 10.4 g/dL — ABNORMAL LOW (ref 12.0–15.0)
MCH: 29.2 pg (ref 26.0–34.0)
MCHC: 31.8 g/dL (ref 30.0–36.0)
MCV: 91.9 fL (ref 80.0–100.0)
Platelets: 353 K/uL (ref 150–400)
RBC: 3.56 MIL/uL — ABNORMAL LOW (ref 3.87–5.11)
RDW: 13.4 % (ref 11.5–15.5)
WBC: 16 K/uL — ABNORMAL HIGH (ref 4.0–10.5)
nRBC: 0 % (ref 0.0–0.2)

## 2024-01-13 MED ORDER — IRBESARTAN 150 MG PO TABS
75.0000 mg | ORAL_TABLET | Freq: Every day | ORAL | Status: DC
  Administered 2024-01-13 – 2024-01-14 (×2): 75 mg via ORAL
  Filled 2024-01-13 (×2): qty 1

## 2024-01-13 MED ORDER — ALBUTEROL SULFATE HFA 108 (90 BASE) MCG/ACT IN AERS
1.0000 | INHALATION_SPRAY | RESPIRATORY_TRACT | Status: DC | PRN
Start: 2024-01-13 — End: 2024-01-14
  Administered 2024-01-13: 2 via RESPIRATORY_TRACT
  Filled 2024-01-13: qty 6.7

## 2024-01-13 NOTE — Progress Notes (Signed)
 Pt failed walking O2 trial. Pt O2 decreased to 78% with HR of 120s

## 2024-01-13 NOTE — Plan of Care (Signed)

## 2024-01-13 NOTE — Assessment & Plan Note (Signed)
 Patient met sepsis criteria with leukocytosis, tachycardia and tachypnea, likely due to multifocal pneumonia. Procalcitonin elevated, preliminary blood cultures negative.  Strep pneumo antigen negative Pending sputum cultures  - Continue with ceftriaxone and Zithromax  -Continue with supportive care

## 2024-01-13 NOTE — Assessment & Plan Note (Signed)
 Patient had AKI with baseline history of CKD stage IIIb.  Baseline seems to be around 1.3-1.4, creatinine improved to 1.43 today -Monitor renal function -Avoid nephrotoxins

## 2024-01-13 NOTE — Assessment & Plan Note (Signed)
 Blood pressure mildly elevated. -Restarting home telmisartan -switched with irbesartan -As needed hydralazine

## 2024-01-13 NOTE — Progress Notes (Signed)
 Progress Note   Patient: Kristen Mills FMW:[id number] DOB: 1950-09-24 DOA: 01/11/2024     2 DOS: the patient was seen and examined on 01/13/2024   Brief hospital course: Partly taken from H&P.  Kristen Mills is a 73 y.o. female with medical history significant of COPD Gold stage II, HTN, HLD, CKD stage II, cocaine abuse, cigarette smoking, presented with worsening of shortness of breath, cough and wheezing for about 1 week.  When EMS arrived, they found oxygen saturation at 62% on room air, he was placed on nonrebreather and was given Solu-Medrol and DuoNeb.  On presentation patient was tachycardic and tachypneic, saturating 98% on 5 L, labs with VBG 7.30 4/37/53, WBC 17.4 hemoglobin 11.8 BUN 54 creatinine 2.9 compared to baseline 1.4.  Patient did not tolerate BiPAP. Chest x-ray concerning for multifocal pneumonia.  Patient was started on ceftriaxone and azithromycin .  10/17: Vital stable on 4 L of oxygen, small improvement in leukocytosis now at 16.4, hemoglobin 10.2, CO2 17, BUN 44, creatinine 1.72, preliminary blood cultures negative, UDS positive for cocaine, respiratory panel negative for COVID, influenza and RSV,  procalcitonin at 8.01.  Sputum culture and urinary antigens are still pending.  10/18: Vital stable, currently on 2 L.  Slowly improving leukocytosis but still at 16, patient was also getting steroid, creatinine improved to 1.43.  Sputum cultures pending.  Assessment and Plan: * Sepsis [facility]) Patient met sepsis criteria with leukocytosis, tachycardia and tachypnea, likely due to multifocal pneumonia. Procalcitonin elevated, preliminary blood cultures negative.  Strep pneumo antigen negative Pending sputum cultures  - Continue with ceftriaxone and Zithromax  -Continue with supportive care  Acute hypoxic respiratory failure (HCC) Currently on 2 L of oxygen with no baseline oxygen use.  Likely secondary to multifocal pneumonia causing COPD exacerbation.  -Continue  supplemental oxygen-wean as tolerated   COPD with acute exacerbation (HCC) No wheezing when seen today. -Continue with prednisone  -Continue with bronchodilators  AKI (acute kidney injury) Patient had AKI with baseline history of CKD stage IIIb.  Baseline seems to be around 1.3-1.4, creatinine improved to 1.43 today -Monitor renal function -Avoid nephrotoxins  Essential hypertension Blood pressure mildly elevated. -Restarting home telmisartan -switched with irbesartan -As needed hydralazine  Hypothyroidism - Continue home Synthroid   Anxiety and depression - Continuing home as needed Atarax  and Wellbutrin   Polysubstance abuse (HCC) UDS positive for cocaine. - Counseling was provided  Hyperlipidemia - Continue statin  History of cigarette smoking Counseling was provided. -Nicotine patch as needed  Subjective: Patient was resting comfortably when seen today.  Still having some cough but overall thinking that she is getting better.  Still on 2 L of oxygen.  Physical Exam: Vitals:   01/12/24 1921 01/12/24 2007 01/13/24 0413 01/13/24 0803  BP: (!) 148/81  138/70 (!) 148/95  Pulse: 88  91 89  Resp: 18  18 17   Temp: 98.5 F (36.9 C)  98.6 F (37 C) 97.9 F (36.6 C)  TempSrc:    Oral  SpO2: 100% 100% 100% 99%  Weight:      Height:       General.  Frail elderly lady, in no acute distress. Pulmonary.  Some scattered rhonchi bilaterally, normal respiratory effort. CV.  Regular rate and rhythm, no JVD, rub or murmur. Abdomen.  Soft, nontender, nondistended, BS positive. CNS.  Alert and oriented .  No focal neurologic deficit. Extremities.  No edema, no cyanosis, pulses intact and symmetrical. Psychiatry.  Judgment and insight appears normal.   Data Reviewed: Prior data reviewed  Family Communication: Tried calling son again, seems like some business number and unable to reach anyone.  Disposition: Status is: Inpatient Remains inpatient appropriate because: Severity  of illness  Planned Discharge Destination: Home  DVT prophylaxis.  Subcu heparin Time spent: 50 minutes  This record has been created using Conservation officer, historic buildings. Errors have been sought and corrected,but may not always be located. Such creation errors do not reflect on the standard of care.   Author: Amaryllis Dare, MD 01/13/2024 2:07 PM  For on call review [email or web address].

## 2024-01-13 NOTE — Assessment & Plan Note (Signed)
 No wheezing when seen today. -Continue with prednisone  -Continue with bronchodilators

## 2024-01-13 NOTE — Assessment & Plan Note (Signed)
 Currently on 2 L of oxygen with no baseline oxygen use.  Likely secondary to multifocal pneumonia causing COPD exacerbation.  -Continue supplemental oxygen-wean as tolerated

## 2024-01-13 NOTE — Care Management Important Message (Signed)
 Important Message  Patient Details  Name: Kristen Mills MRN: [id number] Date of Birth: 09-30-1950   Important Message Given:  Yes - Medicare IM     Rojelio [name] 01/13/2024, 2:11 PM

## 2024-01-14 ENCOUNTER — Other Ambulatory Visit: Payer: Self-pay

## 2024-01-14 DIAGNOSIS — J441 Chronic obstructive pulmonary disease with (acute) exacerbation: Secondary | ICD-10-CM | POA: Diagnosis not present

## 2024-01-14 DIAGNOSIS — A419 Sepsis, unspecified organism: Secondary | ICD-10-CM | POA: Diagnosis not present

## 2024-01-14 DIAGNOSIS — J9601 Acute respiratory failure with hypoxia: Secondary | ICD-10-CM | POA: Diagnosis not present

## 2024-01-14 DIAGNOSIS — J189 Pneumonia, unspecified organism: Secondary | ICD-10-CM | POA: Diagnosis not present

## 2024-01-14 LAB — CULTURE, BLOOD (ROUTINE X 2)

## 2024-01-14 MED ORDER — LEVOFLOXACIN 750 MG PO TABS
750.0000 mg | ORAL_TABLET | Freq: Once | ORAL | 0 refills | Status: DC
  Filled 2024-01-14: qty 1, 1d supply, fill #0

## 2024-01-14 MED ORDER — LEVOFLOXACIN 750 MG PO TABS
750.0000 mg | ORAL_TABLET | ORAL | 0 refills | Status: DC
  Filled 2024-01-14: qty 3, 6d supply, fill #0

## 2024-01-14 MED ORDER — PREDNISONE 20 MG PO TABS
40.0000 mg | ORAL_TABLET | Freq: Every day | ORAL | 0 refills | Status: AC
  Filled 2024-01-14: qty 6, 3d supply, fill #0

## 2024-01-14 MED ORDER — NICOTINE 21 MG/24HR TD PT24
21.0000 mg | MEDICATED_PATCH | Freq: Every day | TRANSDERMAL | 0 refills | Status: AC
  Filled 2024-01-14: qty 28, 28d supply, fill #0

## 2024-01-14 MED ORDER — LEVOFLOXACIN 750 MG PO TABS
750.0000 mg | ORAL_TABLET | Freq: Once | ORAL | Status: AC
  Administered 2024-01-14: 750 mg via ORAL
  Filled 2024-01-14: qty 1

## 2024-01-14 NOTE — TOC Transition Note (Signed)
 Transition of Care [facility]) - Discharge Note   Patient Details  Name: Kristen Mills MRN: [id number] Date of Birth: [birth year]  Transition of [facility] CM/SW Contact:  Marinda Cooks, RN Phone Number: 01/14/2024, 2:20 PM   Clinical Narrative:    This CM updated by covering MD pt medically cleared to dc today and has active DC order .MD informed pt declined  HH. New oxygen DME coordinated per MD order.  DC transportation confirmed for pt with [facility] updated . No additional DC needs requested by medical team or identified by CM at this time .     Final next level of care: Home/Self Care (MD informed pt declined HH sharing she did  not want anyone in her home) Barriers to Discharge: No Barriers Identified    Discharge Placement  Home               Name of family member notified: Patient Patient and family notified of of transfer: 01/14/24  Discharge Plan and Services Additional resources added to the After Visit Summary for       Post Acute Care Choice: NA          DME Arranged: Oxygen DME Agency: [employer] Date DME Agency Contacted: 01/14/24 Time DME Agency Contacted: 1030 Representative spoke with at DME Agency: [place]            Social Drivers of Health (SDOH) Interventions SDOH Screenings   Food Insecurity: No Food Insecurity (01/11/2024)  Housing: Low Risk  (01/11/2024)  Transportation Needs: No Transportation Needs (01/11/2024)  Utilities: At Risk (01/12/2024)  Alcohol  Screen: Low Risk  (01/20/2023)  Depression (PHQ2-9): Medium Risk (08/07/2023)  Financial Resource Strain: Medium Risk (01/20/2023)  Physical Activity: Inactive (01/20/2023)  Social Connections: Patient Unable To Answer (01/11/2024)  Stress: Stress Concern Present (01/20/2023)  Tobacco Use: High Risk (01/11/2024)  Health Literacy: Adequate Health Literacy (01/20/2023)     Readmission Risk Interventions    01/12/2024    2:29 PM  Readmission Risk Prevention Plan   Transportation Screening Complete  PCP or Specialist Appt within 3-5 Days Complete  Social Work Consult for Recovery Care Planning/Counseling Complete  Palliative Care Screening Not Applicable  Medication Review Oceanographer) Complete

## 2024-01-14 NOTE — Progress Notes (Signed)
 SATURATION QUALIFICATIONS: (This note is used to comply with regulatory documentation for home oxygen)  Patient Saturations on Room Air at Rest = 87%  Patient Saturations on Room Air while Ambulating = 84%  Patient Saturations on 3 Liters of oxygen while Ambulating = 90%  Please briefly explain why patient needs home oxygen:Patients oxygen saturations drops below 88% on room air at rest.   Kristen Mills, PT, GCS 01/14/24,9:45 AM

## 2024-01-14 NOTE — Evaluation (Signed)
 Physical Therapy Evaluation Patient Details Name: Kristen Mills MRN: [id number] DOB: Jul 24, 1950 Today's Date: 01/14/2024  History of Present Illness  Kristen Mills is a 73 y.o. female with medical history significant of COPD Gold stage II, HTN, HLD, CKD stage II, cocaine abuse, cigarette smoking, presented with worsening of shortness of breath, cough and wheezing.  Clinical Impression  Patient received sitting on side of bed, she wants to go home. O2 sats on room air at rest 87%. With 2 liters via [place] sats increased to 88%, increased O2 to 3 liters and sats increased to 90% during ambulation. She does not require AD and ambulated ~300 feet. Patient will continue to benefit from skilled PT to monitor O2 and educate patient on use.         If plan is discharge home, recommend the following: A little help with walking and/or transfers   Can travel by private vehicle    yes    Equipment Recommendations None recommended by PT;Other (comment) (Oxygen)  Recommendations for Other Services       Functional Status Assessment Patient has had a recent decline in their functional status and demonstrates the ability to make significant improvements in function in a reasonable and predictable amount of time.     Precautions / Restrictions Precautions Precautions: None Restrictions Weight Bearing Restrictions Per Provider Order: No      Mobility  Bed Mobility Overal bed mobility: Independent                  Transfers Overall transfer level: Independent                      Ambulation/Gait Ambulation/Gait assistance: Supervision Gait Distance (Feet): 300 Feet Assistive device: None Gait Pattern/deviations: Step-through pattern Gait velocity: WFL     General Gait Details: no lOB or difficulty with ambulation other than O2 needs. Began at 2 liters, O2 sats at 88%, increased O2 to 3 L and sats improved to 90% while ambulating.  Stairs            Wheelchair  Mobility     Tilt Bed    Modified Rankin (Stroke Patients Only)       Balance Overall balance assessment: Independent                                           Pertinent Vitals/Pain Pain Assessment Pain Assessment: No/denies pain    Home Living Family/patient expects to be discharged to:: Private residence Living Arrangements: Alone Available Help at Discharge: Family;Available PRN/intermittently Type of Home: House Home Access: Level entry       Home Layout: One level Home Equipment: Agricultural consultant (2 wheels)      Prior Function Prior Level of Function : Independent/Modified Independent             Mobility Comments: patient independent at baseline. Does not have car so she walks to store ADLs Comments: independent     Extremity/Trunk Assessment   Upper Extremity Assessment Upper Extremity Assessment: Overall WFL for tasks assessed    Lower Extremity Assessment Lower Extremity Assessment: Overall WFL for tasks assessed    Cervical / Trunk Assessment Cervical / Trunk Assessment: Normal  Communication   Communication Communication: No apparent difficulties    Cognition Arousal: Alert Behavior During Therapy: WFL for tasks assessed/performed   PT - Cognitive impairments: No  apparent impairments                         Following commands: Intact       Cueing Cueing Techniques: Verbal cues     General Comments      Exercises     Assessment/Plan    PT Assessment Patient needs continued PT services  PT Problem List Decreased activity tolerance;Decreased mobility;Cardiopulmonary status limiting activity       PT Treatment Interventions Gait training;Therapeutic exercise;Therapeutic activities;Patient/family education    PT Goals (Current goals can be found in the Care Plan section)  Acute Rehab PT Goals Patient Stated Goal: return home today so she can smoke PT Goal Formulation: With patient Time For Goal  Achievement: 01/20/24 Potential to Achieve Goals: Good    Frequency Min 2X/week     Co-evaluation               AM-PAC PT 6 Clicks Mobility  Outcome Measure Help needed turning from your back to your side while in a flat bed without using bedrails?: None Help needed moving from lying on your back to sitting on the side of a flat bed without using bedrails?: None Help needed moving to and from a bed to a chair (including a wheelchair)?: None Help needed standing up from a chair using your arms (e.g., wheelchair or bedside chair)?: None Help needed to walk in hospital room?: A Little Help needed climbing 3-5 steps with a railing? : A Little 6 Click Score: 22    End of Session Equipment Utilized During Treatment: Oxygen Activity Tolerance: Patient tolerated treatment well Patient left: in bed;Other (comment) (seated on side of bed.) Nurse Communication: Mobility status PT Visit Diagnosis: Muscle weakness (generalized) (M62.81);Other abnormalities of gait and mobility (R26.89)    Time: 9091-9080 PT Time Calculation (min) (ACUTE ONLY): 11 min   Charges:   PT Evaluation $PT Eval Low Complexity: 1 Low   PT General Charges $$ ACUTE PT VISIT: 1 Visit         Yakub Lodes, PT, GCS 01/14/24,9:54 AM

## 2024-01-14 NOTE — Progress Notes (Signed)
SATURATION QUALIFICATIONS: (This note is used to comply with regulatory documentation for home oxygen)  Patient Saturations on Room Air at Rest = 89 %  Patient Saturations on Room Air while Ambulating = 82%  Patient Saturations on 3 Liters of oxygen while Ambulating = 90%  Please briefly explain why patient needs home oxygen:

## 2024-01-14 NOTE — Discharge Summary (Signed)
 Physician Discharge Summary   Patient: Kristen Mills MRN: [id number] DOB: 17-Aug-1950  Admit date:     01/11/2024  Discharge date: 01/14/24  Discharge Physician: Amaryllis Dare   PCP: Valerio Melanie DASEN, NP   Recommendations at discharge:  Please obtain CBC and BMP on follow-up Follow-up with primary care provider Follow-up with pulmonology-referral was given Please ensure completion of antibiotics.  Discharge Diagnoses: Principal Problem:   Sepsis (HCC) Active Problems:   Acute hypoxic respiratory failure (HCC)   CAP (community acquired pneumonia)   COPD with acute exacerbation (HCC)   AKI (acute kidney injury)   Chronic kidney disease (CKD), stage III (moderate) (HCC)   Essential hypertension   Hypothyroidism   Anxiety and depression   Polysubstance abuse (HCC)   Hyperlipidemia   History of cigarette smoking   PNA (pneumonia)  Resolved Problems:   * No resolved hospital problems. [facility] Course: Partly taken from H&P.  Kristen Mills is a 73 y.o. female with medical history significant of COPD Gold stage II, HTN, HLD, CKD stage II, cocaine abuse, cigarette smoking, presented with worsening of shortness of breath, cough and wheezing for about 1 week.  When EMS arrived, they found oxygen saturation at 62% on room air, he was placed on nonrebreather and was given Solu-Medrol and DuoNeb.  On presentation patient was tachycardic and tachypneic, saturating 98% on 5 L, labs with VBG 7.30 4/37/53, WBC 17.4 hemoglobin 11.8 BUN 54 creatinine 2.9 compared to baseline 1.4.  Patient did not tolerate BiPAP. Chest x-ray concerning for multifocal pneumonia.  Patient was started on ceftriaxone and azithromycin .  10/17: Vital stable on 4 L of oxygen, small improvement in leukocytosis now at 16.4, hemoglobin 10.2, CO2 17, BUN 44, creatinine 1.72, preliminary blood cultures negative, UDS positive for cocaine, respiratory panel negative for COVID, influenza and RSV,  procalcitonin at  8.01.  Sputum culture and urinary antigens are still pending.  10/18: Vital stable, currently on 2 L.  Slowly improving leukocytosis but still at 16, patient was also getting steroid, creatinine improved to 1.43.  Sputum cultures pending.  10/19: Hemodynamically stable.  Patient was ambulated with pulse ox and will need 3 to 4 L of oxygen with ambulation.  Her oxygen requirement improved at rest.  Sputum cultures growing Pseudomonas, antibiotics switched with Levaquin and she was given a 5-day course which she will take every 48 hours based on her renal function.  Patient will continue using oxygen and need to have follow-up with a pulmonologist for further management of her underlying COPD.  She should encouraged to stop smoking, we provided her with nicotine patch.  She will continue on current medications and need to have a close follow-up with her providers for further assistance.  Assessment and Plan: * Sepsis [facility]) Patient met sepsis criteria with leukocytosis, tachycardia and tachypnea, likely due to multifocal pneumonia. Procalcitonin elevated, preliminary blood cultures negative.  Strep pneumo antigen negative Sputum cultures growing Pseudomonas antibiotics switched with Levaquin, she was given a total of 5-day course.  Acute hypoxic respiratory failure (HCC) Currently on 2 L of oxygen with no baseline oxygen use.  Likely secondary to multifocal pneumonia causing COPD exacerbation.  Patient need up to 4 L of oxygen with ambulation -Continue supplemental oxygen-wean as tolerated   COPD with acute exacerbation (HCC) No wheezing when seen today. -Continue with prednisone  -Continue with bronchodilators  AKI (acute kidney injury) Patient had AKI with baseline history of CKD stage IIIb.  Baseline seems to be around 1.3-1.4, creatinine  improved to 1.43  -Monitor renal function -Avoid nephrotoxins  Essential hypertension Blood pressure mildly elevated. Continue with home medications  and follow-up with PCP  Hypothyroidism - Continue home Synthroid   Anxiety and depression - Continuing home as needed Atarax  and Wellbutrin   Polysubstance abuse (HCC) UDS positive for cocaine. - Counseling was provided  Hyperlipidemia - Continue statin  History of cigarette smoking Counseling was provided. -Nicotine patch as needed  Consultants: None Procedures performed: None Disposition: Home Diet recommendation:  Discharge Diet Orders (From admission, onward)     Start     Ordered   01/14/24 0000  Diet - low sodium heart healthy        01/14/24 1200           Regular diet DISCHARGE MEDICATION: Allergies as of 01/14/2024   No Known Allergies      Medication List     TAKE these medications    albuterol  108 (90 Base) MCG/ACT inhaler Commonly known as: VENTOLIN  HFA INHALE 2 PUFFS EVERY 4 TO 6 HOURS AS NEEDED FOR SHORTNESS OF BREATH   atorvastatin  20 MG tablet Commonly known as: LIPITOR Take 1 tablet (20 mg total) by mouth daily.   Breztri Aerosphere 160-9-4.8 MCG/ACT Aero inhaler Generic drug: budesonide-glycopyrrolate-formoterol Inhale 2 puffs into the lungs in the morning and at bedtime.   buPROPion  300 MG 24 hr tablet Commonly known as: Wellbutrin  XL Take 1 tablet (300 mg total) by mouth daily.   Cholecalciferol  1.25 MG (50000 UT) capsule Take 1 capsule (50,000 Units total) by mouth once a week.   gabapentin  300 MG capsule Commonly known as: NEURONTIN  Take 1 capsule (300 mg total) by mouth 3 (three) times daily.   hydrOXYzine  10 MG tablet Commonly known as: ATARAX  Take 1 tablet (10 mg total) by mouth 2 (two) times daily as needed.   levofloxacin 750 MG tablet Commonly known as: LEVAQUIN Take 1 tablet (750 mg total) by mouth every other day. (Next dose is due on Tuesday 01/16/24)   levothyroxine  50 MCG tablet Commonly known as: SYNTHROID  Take 1 tablet (50 mcg total) by mouth daily.   nicotine 21 mg/24hr patch Commonly known as:  NICODERM CQ - dosed in mg/24 hours Place 1 patch (21 mg total) onto the skin daily. Start taking on: January 15, 2024   predniSONE  20 MG tablet Commonly known as: DELTASONE  Take 2 tablets (40 mg total) by mouth daily with breakfast for 3 days. Start taking on: January 15, 2024   telmisartan  20 MG tablet Commonly known as: MICARDIS  Take 1 tablet (20 mg total) by mouth daily.   traZODone  50 MG tablet Commonly known as: DESYREL  Take 1 tablet (50 mg total) by mouth at bedtime.               Durable Medical Equipment  (From admission, onward)           Start     Ordered   01/14/24 1012  For home use only DME oxygen  Once       Question Answer Comment  Length of Need 6 Months   Mode or (Route) Nasal cannula   Liters per Minute 4   Frequency Continuous (stationary and portable oxygen unit needed)   Oxygen conserving device Yes   Oxygen delivery system Gas      01/14/24 1011            Follow-up Information     Valerio Melanie DASEN, NP. Schedule an appointment as soon as possible for a visit  in 1 week(s).   Specialty: Nurse Practitioner Contact information: [address] [place] [address] [phone]                Discharge Exam: Fredricka Weights   01/11/24 0453  Weight: 57 kg   General.  Frail lady, in no acute distress. Pulmonary.  Few scattered rhonchi bilaterally, normal respiratory effort. CV.  Regular rate and rhythm, no JVD, rub or murmur. Abdomen.  Soft, nontender, nondistended, BS positive. CNS.  Alert and oriented .  No focal neurologic deficit. Extremities.  No edema, no cyanosis, pulses intact and symmetrical. Psychiatry.  Judgment and insight appears normal.   Condition at discharge: stable  The results of significant diagnostics from this hospitalization (including imaging, microbiology, ancillary and laboratory) are listed below for reference.   Imaging Studies: US  RENAL Result Date: 01/11/2024 EXAM: US  Retroperitoneum  Complete, Renal. CLINICAL HISTORY: AKI (acute kidney injury). TECHNIQUE: Real-time ultrasound of the retroperitoneum (complete) with image documentation. COMPARISON: 01/26/2221. FINDINGS: RIGHT KIDNEY: Measures 8.0 x 4.3 x 4.2 cm. Right kidney volume is 75 ml. 2 simple cysts are seen in the right kidney, the longest measuring 8 mm. No hydronephrosis, renal stone, or mass visualized. LEFT KIDNEY: Measures 7.3 x 4.6 x 3.5 cm. Left kidney volume is 62 ml. No hydronephrosis, renal stone, or mass visualized. BLADDER: Unremarkable as visualized. IMPRESSION: 1. No hydronephrosis. 2. Two simple right renal cysts, largest 8 mm. Electronically signed by: Lynwood Seip MD 01/11/2024 09:29 AM EDT RP Workstation: [id number]   DG Chest Portable 1 View Result Date: 01/11/2024 CLINICAL DATA:  Shortness of breath. EXAM: PORTABLE CHEST 1 VIEW COMPARISON:  10/07/2013 FINDINGS: Lungs are hyperexpanded. Patchy and nodular asymmetric airspace opacity identified in the lungs, right greater than left. No pulmonary edema or pleural effusion. The cardiopericardial silhouette is within normal limits for size. No acute bony abnormality. Telemetry leads overlie the chest. IMPRESSION: Patchy and nodular asymmetric airspace opacity in the lungs, right greater than left. Imaging features are compatible with multifocal pneumonia. Close follow-up recommended to ensure resolution. Electronically Signed   By: Camellia Candle M.D.   On: 01/11/2024 05:29    Microbiology: Results for orders placed or performed during the hospital encounter of 01/11/24  Resp panel by RT-PCR (RSV, Flu A&B, Covid) Anterior Nasal Swab     Status: None   Collection Time: 01/11/24  5:50 AM   Specimen: Anterior Nasal Swab  Result Value Ref Range Status   SARS Coronavirus 2 by RT PCR NEGATIVE NEGATIVE Final    Comment: (NOTE) SARS-CoV-2 target nucleic acids are NOT DETECTED.  The SARS-CoV-2 RNA is generally detectable in upper respiratory specimens during the acute  phase of infection. The lowest concentration of SARS-CoV-2 viral copies this assay can detect is 138 copies/mL. A negative result does not preclude SARS-Cov-2 infection and should not be used as the sole basis for treatment or other patient management decisions. A negative result may occur with  improper specimen collection/handling, submission of specimen other than nasopharyngeal swab, presence of viral mutation(s) within the areas targeted by this assay, and inadequate number of viral copies(<138 copies/mL). A negative result must be combined with clinical observations, patient history, and epidemiological information. The expected result is Negative.  Fact Sheet for Patients:  BloggerCourse.com  Fact Sheet for Healthcare Providers:  SeriousBroker.it  This test is no t yet approved or cleared by the [place]  FDA and  has been authorized for detection and/or diagnosis of SARS-CoV-2 by FDA under an Emergency Use Authorization (  EUA). This EUA will remain  in effect (meaning this test can be used) for the duration of the COVID-19 declaration under Section 564(b)(1) of the Act, 21 U.S.C.section 360bbb-3(b)(1), unless the authorization is terminated  or revoked sooner.       Influenza A by PCR NEGATIVE NEGATIVE Final   Influenza B by PCR NEGATIVE NEGATIVE Final    Comment: (NOTE) The Xpert Xpress SARS-CoV-2/FLU/RSV plus assay is intended as an aid in the diagnosis of influenza from Nasopharyngeal swab specimens and should not be used as a sole basis for treatment. Nasal washings and aspirates are unacceptable for Xpert Xpress SARS-CoV-2/FLU/RSV testing.  Fact Sheet for Patients: BloggerCourse.com  Fact Sheet for Healthcare Providers: SeriousBroker.it  This test is not yet approved or cleared by the [place]  FDA and has been authorized for detection and/or diagnosis of  SARS-CoV-2 by FDA under an Emergency Use Authorization (EUA). This EUA will remain in effect (meaning this test can be used) for the duration of the COVID-19 declaration under Section 564(b)(1) of the Act, 21 U.S.C. section 360bbb-3(b)(1), unless the authorization is terminated or revoked.     Resp Syncytial Virus by PCR NEGATIVE NEGATIVE Final    Comment: (NOTE) Fact Sheet for Patients: BloggerCourse.com  Fact Sheet for Healthcare Providers: SeriousBroker.it  This test is not yet approved or cleared by the [place]  FDA and has been authorized for detection and/or diagnosis of SARS-CoV-2 by FDA under an Emergency Use Authorization (EUA). This EUA will remain in effect (meaning this test can be used) for the duration of the COVID-19 declaration under Section 564(b)(1) of the Act, 21 U.S.C. section 360bbb-3(b)(1), unless the authorization is terminated or revoked.  Performed at [facility], [address] [place], [place] [address]   Blood culture (routine x 2)     Status: Abnormal   Collection Time: 01/11/24  5:50 AM   Specimen: BLOOD  Result Value Ref Range Status   Specimen Description   Final    BLOOD LEFT AC Performed at [facility], [address], [place], [place] [address]    Special Requests   Final    BOTTLES DRAWN AEROBIC AND ANAEROBIC Blood Culture results may not be optimal due to an inadequate volume of blood received in culture bottles Performed at [facility], [address], [place], [place] [address]    Culture  Setup Time   Final    GRAM POSITIVE COCCI AEROBIC BOTTLE ONLY CRITICAL RESULT CALLED TO, READ BACK BY AND VERIFIED WITH:  ESTILL SAYRES, PHARM D AT 1110 01/12/24 RAM    Culture (A)  Final    STAPHYLOCOCCUS WARNERI THE SIGNIFICANCE OF ISOLATING THIS ORGANISM FROM A SINGLE SET OF BLOOD CULTURES WHEN MULTIPLE SETS ARE DRAWN IS UNCERTAIN. PLEASE NOTIFY THE  MICROBIOLOGY DEPARTMENT WITHIN ONE WEEK IF SPECIATION AND SENSITIVITIES ARE REQUIRED. Performed at [facility], [address], [place], [place] [address]    Report Status 01/14/2024 FINAL  Final  Blood Culture ID Panel (Reflexed)     Status: Abnormal   Collection Time: 01/11/24  5:50 AM  Result Value Ref Range Status   Enterococcus faecalis NOT DETECTED NOT DETECTED Final   Enterococcus Faecium NOT DETECTED NOT DETECTED Final   Listeria monocytogenes NOT DETECTED NOT DETECTED Final   Staphylococcus species DETECTED (A) NOT DETECTED Final    Comment: CRITICAL RESULT CALLED TO, READ BACK BY AND VERIFIED WITHBETHA ESTILL SAYRES BERDINE D AT 1110 01/12/24 RAM    Staphylococcus aureus (BCID) NOT  DETECTED NOT DETECTED Final   Staphylococcus epidermidis NOT DETECTED NOT DETECTED Final   Staphylococcus lugdunensis NOT DETECTED NOT DETECTED Final   Streptococcus species NOT DETECTED NOT DETECTED Final   Streptococcus agalactiae NOT DETECTED NOT DETECTED Final   Streptococcus pneumoniae NOT DETECTED NOT DETECTED Final   Streptococcus pyogenes NOT DETECTED NOT DETECTED Final   A.calcoaceticus-baumannii NOT DETECTED NOT DETECTED Final   Bacteroides fragilis NOT DETECTED NOT DETECTED Final   Enterobacterales NOT DETECTED NOT DETECTED Final   Enterobacter cloacae complex NOT DETECTED NOT DETECTED Final   Escherichia coli NOT DETECTED NOT DETECTED Final   Klebsiella aerogenes NOT DETECTED NOT DETECTED Final   Klebsiella oxytoca NOT DETECTED NOT DETECTED Final   Klebsiella pneumoniae NOT DETECTED NOT DETECTED Final   Proteus species NOT DETECTED NOT DETECTED Final   Salmonella species NOT DETECTED NOT DETECTED Final   Serratia marcescens NOT DETECTED NOT DETECTED Final   Haemophilus influenzae NOT DETECTED NOT DETECTED Final   Neisseria meningitidis NOT DETECTED NOT DETECTED Final   Pseudomonas aeruginosa NOT DETECTED NOT DETECTED Final   Stenotrophomonas maltophilia NOT DETECTED NOT DETECTED  Final   Candida albicans NOT DETECTED NOT DETECTED Final   Candida auris NOT DETECTED NOT DETECTED Final   Candida glabrata NOT DETECTED NOT DETECTED Final   Candida krusei NOT DETECTED NOT DETECTED Final   Candida parapsilosis NOT DETECTED NOT DETECTED Final   Candida tropicalis NOT DETECTED NOT DETECTED Final   Cryptococcus neoformans/gattii NOT DETECTED NOT DETECTED Final    Comment: Performed at [facility], [address] [place], [place] [address]  Blood culture (routine x 2)     Status: None (Preliminary result)   Collection Time: 01/11/24  6:08 AM   Specimen: BLOOD  Result Value Ref Range Status   Specimen Description BLOOD RIGHT FA  Final   Special Requests   Final    BOTTLES DRAWN AEROBIC AND ANAEROBIC Blood Culture adequate volume   Culture   Final    NO GROWTH 3 DAYS Performed at [facility], [address] [place], [place] [address]    Report Status PENDING  Incomplete  Expectorated Sputum Assessment w Gram Stain, Rflx to Resp Cult     Status: None   Collection Time: 01/11/24 12:00 PM   Specimen: Sputum  Result Value Ref Range Status   Specimen Description SPUTUM  Final   Special Requests NONE  Final   Sputum evaluation   Final    THIS SPECIMEN IS ACCEPTABLE FOR SPUTUM CULTURE Performed at [facility], [address] [place], [place] [address]    Report Status 01/12/2024 FINAL  Final  Culture, Respiratory w Gram Stain     Status: None (Preliminary result)   Collection Time: 01/11/24 12:00 PM   Specimen: SPU  Result Value Ref Range Status   Specimen Description   Final    SPUTUM Performed at [facility], [address] [place], [place] [address]    Special Requests   Final    NONE Reflexed from [id number] Performed at [facility], [address] [place], [place] [address]    Gram Stain   Final    RARE WBC PRESENT, PREDOMINANTLY PMN RARE GRAM NEGATIVE RODS    Culture   Final    FEW PSEUDOMONAS  AERUGINOSA SUSCEPTIBILITIES TO FOLLOW Performed at [facility], [address] [place], [place] [address]    Report Status PENDING  Incomplete    Labs: CBC: Recent Labs  Lab 01/11/24  0502 01/12/24 0519 01/13/24 0517  WBC 17.4* 16.4* 16.0*  NEUTROABS 13.4*  --   --   HGB 11.8* 10.2* 10.4*  HCT 36.4 30.8* 32.7*  MCV 90.5 91.9 91.9  PLT 287 302 353   Basic Metabolic Panel: Recent Labs  Lab 01/11/24 0502 01/12/24 0519 01/13/24 0517  NA 136 142 142  K 4.6 4.0 4.2  CL 100 112* 110  CO2 20* 17* 22  GLUCOSE 137* 153* 130*  BUN 54* 44* 34*  CREATININE 2.96* 1.72* 1.43*  CALCIUM  8.5* 8.2* 8.9   Liver Function Tests: Recent Labs  Lab 01/11/24 0502  AST 20  ALT 11  ALKPHOS 67  BILITOT 0.5  PROT 6.9  ALBUMIN 3.1*   CBG: No results for input(s): GLUCAP in the last 168 hours.  Discharge time spent: greater than 30 minutes.  This record has been created using Conservation officer, historic buildings. Errors have been sought and corrected,but may not always be located. Such creation errors do not reflect on the standard of care.   Signed: Amaryllis Dare, MD [facility] 01/14/2024

## 2024-01-15 ENCOUNTER — Encounter: Payer: Self-pay | Admitting: Pulmonary Disease

## 2024-01-15 LAB — CULTURE, RESPIRATORY W GRAM STAIN

## 2024-01-16 LAB — CULTURE, BLOOD (ROUTINE X 2)
Culture: NO GROWTH
Special Requests: ADEQUATE

## 2024-01-29 ENCOUNTER — Other Ambulatory Visit: Payer: Self-pay | Admitting: Nurse Practitioner

## 2024-01-29 NOTE — Telephone Encounter (Unsigned)
 Copied from CRM [id number]. Topic: Clinical - Medication Refill >> Jan 29, 2024  4:58 PM Zebedee SAUNDERS wrote: Medication: Budeson-Glycopyrrol-Formoterol (BREZTRI AEROSPHERE) 160-9-4.8 MCG/ACT AERO  Has the patient contacted their pharmacy? Yes (Agent: If no, request that the patient contact the pharmacy for the refill. If patient does not wish to contact the pharmacy document the reason why and proceed with request.) (Agent: If yes, when and what did the pharmacy advise?)  This is the patient's preferred pharmacy:  [facility] - [place], [place] - [address] [place] [address] Phone: [phone] Fax: [phone]  Is this the correct pharmacy for this prescription? Yes If no, delete pharmacy and type the correct one.   Has the prescription been filled recently? Yes  Is the patient out of the medication? Yes  Has the patient been seen for an appointment in the last year OR does the patient have an upcoming appointment? Yes  Can we respond through MyChart? Yes  Agent: Please be advised that Rx refills may take up to 3 business days. We ask that you follow-up with your pharmacy.

## 2024-01-31 MED ORDER — BREZTRI AEROSPHERE 160-9-4.8 MCG/ACT IN AERO: 2.0000 | INHALATION_SPRAY | Freq: Two times a day (BID) | RESPIRATORY_TRACT | 5 refills | Status: DC

## 2024-01-31 NOTE — Telephone Encounter (Signed)
 Requested medication (s) are due for refill today: yes  Requested medication (s) are on the active medication list: no  Last refill:  01/16/23  Future visit scheduled: yes  Notes to clinic:  historical medication     Requested Prescriptions  Pending Prescriptions Disp Refills   budesonide-glycopyrrolate-formoterol (BREZTRI AEROSPHERE) 160-9-4.8 MCG/ACT AERO inhaler      Sig: Inhale 2 puffs into the lungs in the morning and at bedtime.     Off-Protocol Failed - 01/31/2024  9:32 AM      Failed - Medication not assigned to a protocol, review manually.      Passed - Valid encounter within last 12 months    Recent Outpatient Visits           5 months ago Depression, major, recurrent, moderate (HCC)   [place] [facility] [place], Melanie DASEN, NP

## 2024-02-11 NOTE — Patient Instructions (Signed)
 Be Involved in Caring For Your Health:  Taking Medications When medications are taken as directed, they can greatly improve your health. But if they are not taken as prescribed, they may not work. In some cases, not taking them correctly can be harmful. To help ensure your treatment remains effective and safe, understand your medications and how to take them. Bring your medications to each visit for review by your provider.  Your lab results, notes, and after visit summary will be available on My Chart. We strongly encourage you to use this feature. If lab results are abnormal the clinic will contact you with the appropriate steps. If the clinic does not contact you assume the results are satisfactory. You can always view your results on My Chart. If you have questions regarding your health or results, please contact the clinic during office hours. You can also ask questions on My Chart.  We at [facility] are grateful that you chose us  to provide your care. We strive to provide evidence-based and compassionate care and are always looking for feedback. If you get a survey from the clinic please complete this so we can hear your opinions.  Living with COPD Being diagnosed with chronic obstructive pulmonary disease (COPD) changes your life physically and emotionally. Having COPD can affect your ability to work and do things you enjoy. COPD is not the same for everyone, and it may change over time. Your health care providers can help you come up with the COPD management plan that works best for you. How to manage lifestyle changes Treatment plan Work closely with your health care providers. Follow your COPD management plan. This plan includes: Instructions about activities, exercises, diet, medicines, what to do when COPD flares up, and when to call your health care provider. A pulmonary rehabilitation program. In pulmonary rehab, you will learn about COPD, do exercises for fitness and  breathing, and get support from health care providers and other people who have COPD. Managing emotions and stress Living with a chronic disease means you may also struggle with stressful emotions, such as sadness, fear, and worry. Here are some ways to manage these emotions: Talk to someone about your fear, anxiety, depression, or stress. Learn strategies to avoid or reduce stress and ask for help if you are struggling with depression or anxiety. Consider joining a COPD support group, online or in person.  Adjusting to changes COPD may limit the things you can do, but you can make certain changes to help you cope with the diagnosis. Ask for help when you need it. Getting support from friends, family, and your health care team is an important part of managing the condition. Try to get regular exercise as prescribed by a health care provider or pulmonary rehab team. Exercising can help COPD, even if you are a bit short of breath. Take steps to prevent infection and protect your lungs: Wash your hands often and avoid being in crowds. Stay away from friends and family members who are sick. Check your local air quality each day, and stay out of areas where air pollution is likely. How to recognize changes in your condition Recognizing changes in your COPD COPD is a progressive disease. It is important to let the health care team know if your COPD is getting worse. Your treatment plan may need to change. Watch for: Increased shortness of breath, wheezing, cough, or fatigue. Loss of ability to exercise or perform daily activities, like climbing stairs. More frequent symptom flares. Signs  of depression or anxiety. Recognizing stress It is normal to have additional stress when you have COPD. However, prolonged stress and anxiety can make COPD worse and lead to depression. Recognize the warning signs, which include: Feeling sad or worried more often or most of the time. Having less energy and losing  interest in pleasurable activities. Changes in your appetite or sleeping patterns. Being easily angered or irritated. Having unexplained aches and pains, digestive problems, or headaches. Follow these instructions at home: Eating and drinking  Eat foods that are high in fiber, such as fresh fruits and vegetables, whole grains, and beans. Limit foods that are high in fat and processed sugars, such as fried or sweet foods. Follow a balanced diet and maintain a healthy weight. Being overweight or underweight can make COPD worse. You may work with a Data processing manager as part of your pulmonary rehab program. Drink enough fluid to keep your urine pale yellow. If you drink alcohol: Limit how much you have to: 0-1 drink a day for women who are not pregnant. 0-2 drinks a day for men. Know how much alcohol is in your drink. In the [place] one drink equals one 12 oz bottle of beer (355 mL), one 5 oz glass of wine (148 mL), or one 1 oz glass of hard liquor (44 mL). Lifestyle If you smoke, the most important thing that you can do is to stop smoking. Continuing to smoke will cause the disease to progress faster. Do not use any products that contain nicotine or tobacco. These products include cigarettes, chewing tobacco, and vaping devices, such as e-cigarettes. If you need help quitting, ask your health care provider. Avoid exposure to things that irritate your lungs, such as smoke, chemicals, and fumes. Activity Balance exercise and rest. Take short walks every 1-2 hours. This is important to improve blood flow and breathing. Ask for help if you feel weak or unsteady. Do exercises that include controlled breathing with body movement, such as tai chi. General instructions Take over-the-counter and prescription medicines only as told by your health care provider. Take vitamin and protein supplements as told by your health care provider or dietitian. Practice good oral hygiene and see your dental care provider  regularly. An oral infection can also spread to your lungs. Make sure you receive all the vaccines that your health care provider recommends. Keep all follow-up visits. This is important. Contact a health care provider if you: Are struggling to manage your COPD. Have emotional stress that interferes with your ability to cope with COPD. Get help right away if you: Have thoughts of suicide, death, or hurting yourself or others. If you ever feel like you may hurt yourself or others, or have thoughts about taking your own life, get help right away. Go to your nearest emergency department or: Call your local emergency services (911 in the [place] Call a suicide crisis helpline, such as the National Suicide Prevention Lifeline at 307-424-3863 or 988 in the [place] This is open 24 hours a day in the [place] If you're a Veteran: Call 988 and press 1. This is open 24 hours a day. Text the [employer] at 478-783-6366. Summary Being diagnosed with chronic obstructive pulmonary disease (COPD) changes your life physically and emotionally. Work with your health care providers and follow your COPD management plan. A pulmonary rehabilitation program is an important part of COPD management. Prolonged stress, anxiety, and depression can make COPD worse. Let your health care provider know if emotional stress interferes with your  ability to cope with and manage COPD. This information is not intended to replace advice given to you by your health care provider. Make sure you discuss any questions you have with your health care provider. Document Revised: 10/27/2022 Document Reviewed: 04/01/2020 Elsevier Patient Education  2024 [employer].

## 2024-02-14 ENCOUNTER — Ambulatory Visit (INDEPENDENT_AMBULATORY_CARE_PROVIDER_SITE_OTHER): Admitting: Nurse Practitioner

## 2024-02-14 ENCOUNTER — Encounter: Payer: Self-pay | Admitting: Nurse Practitioner

## 2024-02-14 VITALS — BP 128/78 | HR 81 | Temp 98.4°F | Resp 14 | Ht 60.0 in | Wt 121.0 lb

## 2024-02-14 DIAGNOSIS — Z0000 Encounter for general adult medical examination without abnormal findings: Secondary | ICD-10-CM | POA: Diagnosis not present

## 2024-02-14 DIAGNOSIS — R7309 Other abnormal glucose: Secondary | ICD-10-CM

## 2024-02-14 DIAGNOSIS — R6520 Severe sepsis without septic shock: Secondary | ICD-10-CM | POA: Diagnosis not present

## 2024-02-14 DIAGNOSIS — F331 Major depressive disorder, recurrent, moderate: Secondary | ICD-10-CM | POA: Diagnosis not present

## 2024-02-14 DIAGNOSIS — A419 Sepsis, unspecified organism: Secondary | ICD-10-CM

## 2024-02-14 DIAGNOSIS — F1410 Cocaine abuse, uncomplicated: Secondary | ICD-10-CM | POA: Diagnosis not present

## 2024-02-14 DIAGNOSIS — J410 Simple chronic bronchitis: Secondary | ICD-10-CM

## 2024-02-14 DIAGNOSIS — E782 Mixed hyperlipidemia: Secondary | ICD-10-CM

## 2024-02-14 DIAGNOSIS — I10 Essential (primary) hypertension: Secondary | ICD-10-CM

## 2024-02-14 DIAGNOSIS — Z23 Encounter for immunization: Secondary | ICD-10-CM | POA: Diagnosis not present

## 2024-02-14 DIAGNOSIS — J189 Pneumonia, unspecified organism: Secondary | ICD-10-CM

## 2024-02-14 DIAGNOSIS — N1832 Chronic kidney disease, stage 3b: Secondary | ICD-10-CM

## 2024-02-14 DIAGNOSIS — F17219 Nicotine dependence, cigarettes, with unspecified nicotine-induced disorders: Secondary | ICD-10-CM

## 2024-02-14 DIAGNOSIS — N179 Acute kidney failure, unspecified: Secondary | ICD-10-CM | POA: Diagnosis not present

## 2024-02-14 DIAGNOSIS — E039 Hypothyroidism, unspecified: Secondary | ICD-10-CM

## 2024-02-14 LAB — BAYER DCA HB A1C WAIVED: HB A1C (BAYER DCA - WAIVED): 5.6 % (ref 4.8–5.6)

## 2024-02-14 MED ORDER — PREDNISONE 20 MG PO TABS: 40.0000 mg | ORAL_TABLET | Freq: Every day | ORAL | 0 refills | Status: AC

## 2024-02-14 MED ORDER — AMOXICILLIN-POT CLAVULANATE 875-125 MG PO TABS: 1.0000 | ORAL_TABLET | Freq: Two times a day (BID) | ORAL | 0 refills | Status: AC

## 2024-02-14 NOTE — Assessment & Plan Note (Signed)
 Ongoing and stable.  Started medication on 09/03/20.  Taking Levothyroxine  daily, continue this and adjust as needed.  Labs today.

## 2024-02-14 NOTE — Assessment & Plan Note (Signed)
 Consider GI referral for further work-up in future, at this time refuses both labs for this or referral.  Recommended no ETOH or cocaine use.

## 2024-02-14 NOTE — Assessment & Plan Note (Signed)
 Chronic, ongoing.  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 Continue Breztri  and Albuterol . Recommend complete smoking cessation, continue Wellbutrin  which has helped with reduction.  Refuses lung CT CA screening at this time, but provided information on this.  Has pulmonary referral from hospital, but continues to refuse to go.  Reports would not pursue treatment if cancer presented or anything else.  Spirometry next visit.   - Current exacerbation, will treat with Augmentin  and Prednisone , which has worked well in past.

## 2024-02-14 NOTE — Assessment & Plan Note (Signed)
 Acute, was improving but does endorse a bit more cough recently and wheezing. Will send in Augmentin  BID for 7 days and Prednisone  40 MG daily for 5 days. Plan on rechecking CXR in 4 weeks. Labs today.

## 2024-02-14 NOTE — Assessment & Plan Note (Signed)
 Chronic, ongoing.  Continue current medication regimen and adjust as needed. Lipid panel today.

## 2024-02-14 NOTE — Assessment & Plan Note (Signed)
 Had been cutting back on use with Wellbutrin  on board and feeling better, but is still using weekly.  Praised for reduction of use and continue to recommend complete cessation.  BP trended down with less use.  Have discussed at length high risk for cardiac death or stroke with continued cocaine use + risk for cocaine being mixed with something that could cause harm to her.  Have highly advised completed cessation.

## 2024-02-14 NOTE — Assessment & Plan Note (Signed)
 A1c 5.6% today, remaining stable.  Recommend continued focus on diet and regular activity.  Continues to maintain her previous weight loss.

## 2024-02-14 NOTE — Progress Notes (Signed)
 6 minute walk test performed by Clinical Lead CMA Katie L.  Testing to be performed in clinic starting with patient at rest.   Start test time: 1412 Start HR at rest- 77  Start SpO2% at rest 92  Contraindications to : []  Resting heart rate > 120 beats / min after 10 minutes rest (relative contraindication) []  Systolic blood pressure > 180 mm Hg +/- diastolic blood pressure > 100 mm Hg (relative contraindication) []  Resting SpO2 <=88% on room air or on prescribed level of supplemental oxygen  []  Physical disability preventing safe performance [x]  No contraindications identified  Minute Marker     With or Without o2  Heart Rate   SPO2% 1 Without O2 81 90  2 Without O2 98 88  3 With 2L 99 91  4 With 2L 102 95  5 With 2L 90 96  6 With 2L 90 96   Termination Criteria: []  Chest pain or angina-like symptoms []  Heart rate > Predicted HR max. []  Evolving mental confusion, light-headedness or incoordination []  Physical or verbal severe fatigue []  Intolerable dyspnoea, unrelieved by rest  []  Persistent SpO2 <85% (Note: pending clinical presentation) []  Abnormal gait pattern (leg cramps, staggering, ataxia) []  Other clinically warranted reason

## 2024-02-14 NOTE — Progress Notes (Signed)
 BP 128/78 (BP Location: Left Arm, Patient Position: Sitting, Cuff Size: Normal)   Pulse 81   Temp 98.4 F (36.9 C) (Oral)   Resp 14   Ht 5' (1.524 m)   Wt 121 lb (54.9 kg)   SpO2 94%   BMI 23.63 kg/m    Subjective:    Patient ID: Kristen Mills, female    DOB: 1950-08-24, 73 y.o.   MRN: [id number]  HPI: Kristen Mills is a 73 y.o. female presenting on 02/14/2024 for comprehensive medical exam. Current medical complaints include:none  She currently lives with: self Menopausal Symptoms: no  COPD Works with [place], PharmD on assistance with Breztri. Did noticed improvement with Breztri. Albuterol  as needed. Was recently in hospital on 01/11/24 for pneumonia, prior to this had not had an exacerbation in some time. Was ordered O2 2-4L in hospital to use at home, would like portable tank to make it easier.  Is sleeping with it on. She was referred to pulmonary while in hospital, but she does not want to go.  Smokes 1 PPD. Has smoked for many years -- continues Wellbutrin . Refuses lung cancer screening, have discussed with her multiple times -- she reports she would not get treatment if cancer presented and does not want screening.   COPD status: stable Satisfied with current treatment?: yes Oxygen use: no Dyspnea frequency: no Cough frequency: no Rescue inhaler frequency: 2-3 times a day Limitation of activity: no Productive cough: yes Last Spirometry: 11/12/18 Pneumovax: Up to Date Influenza: Up to Date  HYPERTENSION / HYPERLIPIDEMIA Takes Telmisartan  20 MG daily and Atorvastatin  20 MG daily.  Has cut back to two beers a month, used to get a beer on pay day, but no longer does this. Continues to use cocaine, last used 3 days ago, but she is not taking as much. Refuses rehab, therapy, or psychiatry referral.   Satisfied with current treatment? yes Duration of hypertension: chronic BP monitoring frequency: not checking BP range:  BP medication side effects: no Duration of  hyperlipidemia: chronic Cholesterol supplements: none Aspirin: no Recent stressors: no Recurrent headaches: no Visual changes: no Palpitations: no Dyspnea: at baseline Chest pain: no Lower extremity edema: no Dizzy/lightheaded: no  The 10-year ASCVD risk score (Arnett DK, et al., 2019) is: 35.6%   Values used to calculate the score:     Age: 20 years     Clincally relevant sex: Female     Is Non-Hispanic African American: No     Diabetic: Yes     Tobacco smoker: Yes     Systolic Blood Pressure: 128 mmHg     Is BP treated: Yes     HDL Cholesterol: 66 mg/dL     Total Cholesterol: 173 mg/dL  Impaired Fasting Glucose HbA1C:  Lab Results  Component Value Date   HGBA1C 5.5 08/07/2023  Duration of elevated blood sugar: years Polydipsia: no Polyuria: no Weight change: no Visual disturbance: no Glucose Monitoring: no    Accucheck frequency: Not Checking    Fasting glucose:     Post prandial:  Diabetic Education: Not Completed Family history of diabetes: no   CHRONIC KIDNEY DISEASE (CKD 3b) Has not seen nephrology since 01/14/21, reports she was not going to do what they said anyways.   CKD status: stable Medications renally dose: yes Previous renal evaluation: no Pneumovax:  Up to Date Influenza Vaccine:  Up to Date   HYPOTHYROIDISM Taking Levothyroxine  50 MCG and Vitamin D  weekly for history of low levels. Thyroid  control  status:stable Satisfied with current treatment? yes Medication side effects: no Medication compliance: good compliance Etiology of hypothyroidism:  Recent dose adjustment:no Fatigue: due to recently being sick Cold intolerance: no Heat intolerance: no Weight gain: no Weight loss: no Constipation: no Diarrhea/loose stools: no Palpitations: no Lower extremity edema: no Anxiety/depressed mood: no    DEPRESSION Takes Wellbutrin  for mood and Trazodone  for sleep + Gabapentin  which she uses for sleep and chronic back pain. Wellbutrin  has helped  her cut back on cocaine use. Mood status: stable Satisfied with current treatment?: yes Symptom severity: mild  Duration of current treatment : chronic Side effects: no Medication compliance: good compliance Psychotherapy/counseling: none Depressed mood: no Anxious mood: no Anhedonia: no Significant weight loss or gain: no Insomnia: yes hard to fall asleep -- Trazodone  helps Fatigue: no Feelings of worthlessness or guilt: no Impaired concentration/indecisiveness: no Suicidal ideations: no Hopelessness: no Crying spells: no    02/14/2024    1:32 PM 08/07/2023    1:09 PM 01/20/2023   11:58 AM 07/20/2022   11:08 AM 12/22/2021   12:33 PM  Depression screen PHQ 2/9  Decreased Interest 1 1 0 1 1  Down, Depressed, Hopeless 0 0 0 1 0  PHQ - 2 Score 1 1 0 2 1  Altered sleeping 0 1 0 0 0  Tired, decreased energy 1 2 0 1 0  Change in appetite 0 1 0 0 0  Feeling bad or failure about yourself  0 0 0 0 0  Trouble concentrating 0 0 0 0 0  Moving slowly or fidgety/restless 0 0 0 0 0  Suicidal thoughts 0 0 0 0 0  PHQ-9 Score 2 5  0  3  1   Difficult doing work/chores Not difficult at all Not difficult at all Not difficult at all Not difficult at all Not difficult at all     Data saved with a previous flowsheet row definition      02/14/2024    1:32 PM 08/07/2023    1:10 PM 01/20/2023   11:59 AM 07/20/2022   11:08 AM  GAD 7 : Generalized Anxiety Score  Nervous, Anxious, on Edge 1 0 1 1  Control/stop worrying 0 1 0 0  Worry too much - different things 0 0 1 0  Trouble relaxing 2 1 0 1  Restless 1 0 0 0  Easily annoyed or irritable 0 0 0 0  Afraid - awful might happen 0 0 0 0  Total GAD 7 Score 4 2 2 2   Anxiety Difficulty Not difficult at all Somewhat difficult Not difficult at all Not difficult at all      07/20/2022   11:07 AM 07/20/2022   11:28 AM 01/20/2023   11:10 AM 08/07/2023    1:09 PM 02/14/2024    1:31 PM  Fall Risk  Falls in the past year? 0 0 0 0 0  Was there an  injury with Fall? 0 0 0 0 0  Fall Risk Category Calculator 0 0 0 0 0  Patient at Risk for Falls Due to No Fall Risks No Fall Risks No Fall Risks No Fall Risks No Fall Risks  Fall risk Follow up Falls evaluation completed Education provided Falls evaluation completed Falls evaluation completed Falls evaluation completed    Functional Status Survey: Is the patient deaf or have difficulty hearing?: No Does the patient have difficulty seeing, even when wearing glasses/contacts?: No Does the patient have difficulty concentrating, remembering, or making decisions?: No Does the patient  have difficulty walking or climbing stairs?: No Does the patient have difficulty dressing or bathing?: No Does the patient have difficulty doing errands alone such as visiting a doctor's office or shopping?: No   Past Medical History:  Past Medical History:  Diagnosis Date   Asthma    Cancer (HCC)    Uterine Cancer   COPD (chronic obstructive pulmonary disease) (HCC)    Depression    Diabetes mellitus without complication (HCC)    Hyperlipidemia    Hypertension    Insomnia    Polysubstance abuse (HCC)    Spinal stenosis    Thyroid  condition     Surgical History:  Past Surgical History:  Procedure Laterality Date   ABDOMINAL HYSTERECTOMY     CATARACT EXTRACTION Bilateral    12/26/22 and 01/09/23    Medications:  Current Outpatient Medications on File Prior to Visit  Medication Sig   albuterol  (VENTOLIN  HFA) 108 (90 Base) MCG/ACT inhaler INHALE 2 PUFFS EVERY 4 TO 6 HOURS AS NEEDED FOR SHORTNESS OF BREATH   atorvastatin  (LIPITOR) 20 MG tablet Take 1 tablet (20 mg total) by mouth daily.   budesonide-glycopyrrolate-formoterol (BREZTRI AEROSPHERE) 160-9-4.8 MCG/ACT AERO inhaler Inhale 2 puffs into the lungs in the morning and at bedtime.   buPROPion  (WELLBUTRIN  XL) 300 MG 24 hr tablet Take 1 tablet (300 mg total) by mouth daily.   Cholecalciferol  1.25 MG (50000 UT) capsule Take 1 capsule (50,000 Units  total) by mouth once a week.   gabapentin  (NEURONTIN ) 300 MG capsule Take 1 capsule (300 mg total) by mouth 3 (three) times daily.   hydrOXYzine  (ATARAX ) 10 MG tablet Take 1 tablet (10 mg total) by mouth 2 (two) times daily as needed.   levofloxacin (LEVAQUIN) 750 MG tablet Take 1 tablet (750 mg total) by mouth every other day. (Next dose is due on Tuesday 01/16/24)   levothyroxine  (SYNTHROID ) 50 MCG tablet Take 1 tablet (50 mcg total) by mouth daily.   nicotine (NICODERM CQ - DOSED IN MG/24 HOURS) 21 mg/24hr patch Place 1 patch (21 mg total) onto the skin daily.   telmisartan  (MICARDIS ) 20 MG tablet Take 1 tablet (20 mg total) by mouth daily.   traZODone  (DESYREL ) 50 MG tablet Take 1 tablet (50 mg total) by mouth at bedtime.   No current facility-administered medications on file prior to visit.    Allergies:  No Known Allergies  Social History:  Social History   Socioeconomic History   Marital status: Single    Spouse name: Not on file   Number of children: Not on file   Years of education: Not on file   Highest education level: Not on file  Occupational History   Not on file  Tobacco Use   Smoking status: Every Day    Current packs/day: 1.00    Average packs/day: 1 pack/day for 45.0 years (45.0 ttl pk-yrs)    Types: Cigarettes   Smokeless tobacco: Never  Vaping Use   Vaping status: Former  Substance and Sexual Activity   Alcohol  use: Yes    Alcohol /week: 3.0 standard drinks of alcohol     Types: 3 Cans of beer per week    Comment: Few beers daily.   Drug use: Yes    Types: Crack cocaine, Marijuana, Cocaine    Comment: currently cocaine   Sexual activity: Not Currently  Other Topics Concern   Not on file  Social History Narrative   Not on file   Social Drivers of Health   Financial Resource Strain:  Medium Risk (01/20/2023)   Overall Financial Resource Strain (CARDIA)    Difficulty of Paying Living Expenses: Somewhat hard  Food Insecurity: No Food Insecurity  (01/11/2024)   Hunger Vital Sign    Worried About Running Out of Food in the Last Year: Never true    Ran Out of Food in the Last Year: Never true  Transportation Needs: No Transportation Needs (01/11/2024)   PRAPARE - Administrator, Civil Service (Medical): No    Lack of Transportation (Non-Medical): No  Physical Activity: Inactive (01/20/2023)   Exercise Vital Sign    Days of Exercise per Week: 0 days    Minutes of Exercise per Session: 0 min  Stress: Stress Concern Present (01/20/2023)   [employer] of Occupational Health - Occupational Stress Questionnaire    Feeling of Stress : To some extent  Social Connections: Patient Unable To Answer (01/11/2024)   Social Connection and Isolation Panel    Frequency of Communication with Friends and Family: Patient unable to answer    Frequency of Social Gatherings with Friends and Family: Patient unable to answer    Attends Religious Services: Patient unable to answer    Active Member of Clubs or Organizations: Patient unable to answer    Attends Banker Meetings: Patient unable to answer    Marital Status: Patient unable to answer  Intimate Partner Violence: Not At Risk (01/11/2024)   Humiliation, Afraid, Rape, and Kick questionnaire    Fear of Current or Ex-Partner: No    Emotionally Abused: No    Physically Abused: No    Sexually Abused: No   Social History   Tobacco Use  Smoking Status Every Day   Current packs/day: 1.00   Average packs/day: 1 pack/day for 45.0 years (45.0 ttl pk-yrs)   Types: Cigarettes  Smokeless Tobacco Never   Social History   Substance and Sexual Activity  Alcohol  Use Yes   Alcohol /week: 3.0 standard drinks of alcohol    Types: 3 Cans of beer per week   Comment: Few beers daily.    Family History:  Family History  Problem Relation Age of Onset   Cancer Mother        Lung   Cancer Father        Lung   Heart disease Sister    Heart attack Sister    Cancer Brother         Lung   Bipolar disorder Son     Past medical history, surgical history, medications, allergies, family history and social history reviewed with patient today and changes made to appropriate areas of the chart.   ROS All other ROS negative except what is listed above and in the HPI.      Objective:    BP 128/78 (BP Location: Left Arm, Patient Position: Sitting, Cuff Size: Normal)   Pulse 81   Temp 98.4 F (36.9 C) (Oral)   Resp 14   Ht 5' (1.524 m)   Wt 121 lb (54.9 kg)   SpO2 94%   BMI 23.63 kg/m   Wt Readings from Last 3 Encounters:  02/14/24 121 lb (54.9 kg)  01/11/24 125 lb 10.6 oz (57 kg)  08/07/23 125 lb 12.8 oz (57.1 kg)    Physical Exam Vitals and nursing note reviewed.  Constitutional:      General: She is awake. She is not in acute distress.    Appearance: She is well-developed and well-groomed. She is not ill-appearing or toxic-appearing.  HENT:  Head: Normocephalic.     Right Ear: Hearing and external ear normal.     Left Ear: Hearing and external ear normal.  Eyes:     General: Lids are normal.        Right eye: No discharge.        Left eye: No discharge.     Conjunctiva/sclera: Conjunctivae normal.     Pupils: Pupils are equal, round, and reactive to light.  Neck:     Thyroid : No thyromegaly.     Vascular: No carotid bruit.  Cardiovascular:     Rate and Rhythm: Normal rate and regular rhythm.     Heart sounds: Normal heart sounds. No murmur heard.    No gallop.  Pulmonary:     Effort: Pulmonary effort is normal. No accessory muscle usage or respiratory distress.     Breath sounds: Normal breath sounds.  Abdominal:     General: Bowel sounds are normal. There is no distension.     Palpations: Abdomen is soft.     Tenderness: There is no abdominal tenderness.  Musculoskeletal:     Cervical back: Normal range of motion and neck supple.     Right lower leg: No edema.     Left lower leg: No edema.  Lymphadenopathy:     Cervical: No  cervical adenopathy.  Skin:    General: Skin is warm and dry.     Findings: Bruising present.     Comments: Scattered bruises to upper extremities.  No wounds.  Neurological:     Mental Status: She is alert and oriented to person, place, and time.     Deep Tendon Reflexes: Reflexes are normal and symmetric.     Reflex Scores:      Brachioradialis reflexes are 2+ on the right side and 2+ on the left side.      Patellar reflexes are 2+ on the right side and 2+ on the left side. Psychiatric:        Attention and Perception: Attention normal.        Mood and Affect: Mood normal.        Speech: Speech normal.        Behavior: Behavior normal. Behavior is cooperative.        Thought Content: Thought content normal.       01/20/2023   11:12 AM 12/22/2021   12:28 PM 12/18/2020    4:34 PM 08/14/2019    2:51 PM  6CIT Screen  What Year? 0 points 0 points 0 points 0 points  What month? 0 points 0 points 0 points 0 points  What time? 0 points 0 points 0 points 0 points  Count back from 20 0 points 0 points 0 points 0 points  Months in reverse 0 points 2 points 2 points 0 points  Repeat phrase 0 points 4 points 0 points 0 points  Total Score 0 points 6 points 2 points 0 points   Results for orders placed or performed during the hospital encounter of 01/11/24  Blood gas, venous   Collection Time: 01/11/24  4:57 AM  Result Value Ref Range   pH, Ven 7.34 7.25 - 7.43   pCO2, Ven 37 (L) 44 - 60 mmHg   pO2, Ven 53 (H) 32 - 45 mmHg   Bicarbonate 20.0 20.0 - 28.0 mmol/L   Acid-base deficit 5.2 (H) 0.0 - 2.0 mmol/L   O2 Saturation 86.5 %   Patient temperature 37.0    Collection site VEIN  CBC with Differential   Collection Time: 01/11/24  5:02 AM  Result Value Ref Range   WBC 17.4 (H) 4.0 - 10.5 K/uL   RBC 4.02 3.87 - 5.11 MIL/uL   Hemoglobin 11.8 (L) 12.0 - 15.0 g/dL   HCT 63.5 63.9 - 53.9 %   MCV 90.5 80.0 - 100.0 fL   MCH 29.4 26.0 - 34.0 pg   MCHC 32.4 30.0 - 36.0 g/dL   RDW 86.7  88.4 - 84.4 %   Platelets 287 150 - 400 K/uL   nRBC 0.0 0.0 - 0.2 %   Neutrophils Relative % 78 %   Neutro Abs 13.4 (H) 1.7 - 7.7 K/uL   Lymphocytes Relative 11 %   Lymphs Abs 2.0 0.7 - 4.0 K/uL   Monocytes Relative 10 %   Monocytes Absolute 1.8 (H) 0.1 - 1.0 K/uL   Eosinophils Relative 0 %   Eosinophils Absolute 0.0 0.0 - 0.5 K/uL   Basophils Relative 0 %   Basophils Absolute 0.1 0.0 - 0.1 K/uL   WBC Morphology See Note    RBC Morphology MORPHOLOGY UNREMARKABLE    Smear Review Normal platelet morphology    Immature Granulocytes 1 %   Abs Immature Granulocytes 0.10 (H) 0.00 - 0.07 K/uL  Comprehensive metabolic panel   Collection Time: 01/11/24  5:02 AM  Result Value Ref Range   Sodium 136 135 - 145 mmol/L   Potassium 4.6 3.5 - 5.1 mmol/L   Chloride 100 98 - 111 mmol/L   CO2 20 (L) 22 - 32 mmol/L   Glucose, Bld 137 (H) 70 - 99 mg/dL   BUN 54 (H) 8 - 23 mg/dL   Creatinine, Ser 7.03 (H) 0.44 - 1.00 mg/dL   Calcium  8.5 (L) 8.9 - 10.3 mg/dL   Total Protein 6.9 6.5 - 8.1 g/dL   Albumin 3.1 (L) 3.5 - 5.0 g/dL   AST 20 15 - 41 U/L   ALT 11 0 - 44 U/L   Alkaline Phosphatase 67 38 - 126 U/L   Total Bilirubin 0.5 0.0 - 1.2 mg/dL   GFR, Estimated 16 (L) >60 mL/min   Anion gap 16 (H) 5 - 15  Brain natriuretic peptide   Collection Time: 01/11/24  5:02 AM  Result Value Ref Range   B Natriuretic Peptide 83.6 0.0 - 100.0 pg/mL  Procalcitonin   Collection Time: 01/11/24  5:02 AM  Result Value Ref Range   Procalcitonin 8.01 ng/mL  Troponin I (High Sensitivity)   Collection Time: 01/11/24  5:02 AM  Result Value Ref Range   Troponin I (High Sensitivity) 12 <18 ng/L  Resp panel by RT-PCR (RSV, Flu A&B, Covid) Anterior Nasal Swab   Collection Time: 01/11/24  5:50 AM   Specimen: Anterior Nasal Swab  Result Value Ref Range   SARS Coronavirus 2 by RT PCR NEGATIVE NEGATIVE   Influenza A by PCR NEGATIVE NEGATIVE   Influenza B by PCR NEGATIVE NEGATIVE   Resp Syncytial Virus by PCR NEGATIVE  NEGATIVE  Blood culture (routine x 2)   Collection Time: 01/11/24  5:50 AM   Specimen: BLOOD  Result Value Ref Range   Specimen Description      BLOOD LEFT AC Performed at [facility], [address] [place], [place] [address]    Special Requests      BOTTLES DRAWN AEROBIC AND ANAEROBIC Blood Culture results may not be optimal due to an inadequate volume of blood received in culture bottles Performed at [facility], [address] [place], [place] [address]    Culture  Setup Time      GRAM POSITIVE COCCI AEROBIC BOTTLE ONLY CRITICAL RESULT CALLED TO, READ BACK BY AND VERIFIED WITH:  ESTILL SAYRES, PHARM D AT 1110 01/12/24 RAM    Culture (A)     STAPHYLOCOCCUS WARNERI THE SIGNIFICANCE OF ISOLATING THIS ORGANISM FROM A SINGLE SET OF BLOOD CULTURES WHEN MULTIPLE SETS ARE DRAWN IS UNCERTAIN. PLEASE NOTIFY THE MICROBIOLOGY DEPARTMENT WITHIN ONE WEEK IF SPECIATION AND SENSITIVITIES ARE REQUIRED. Performed at [facility], [address], [place], [place] [address]    Report Status 01/14/2024 FINAL   Blood Culture ID Panel (Reflexed)   Collection Time: 01/11/24  5:50 AM  Result Value Ref Range   Enterococcus faecalis NOT DETECTED NOT DETECTED   Enterococcus Faecium NOT DETECTED NOT DETECTED   Listeria monocytogenes NOT DETECTED NOT DETECTED   Staphylococcus species DETECTED (A) NOT DETECTED   Staphylococcus aureus (BCID) NOT DETECTED NOT DETECTED   Staphylococcus epidermidis NOT DETECTED NOT DETECTED   Staphylococcus lugdunensis NOT DETECTED NOT DETECTED   Streptococcus species NOT DETECTED NOT DETECTED   Streptococcus agalactiae NOT DETECTED NOT DETECTED   Streptococcus pneumoniae NOT DETECTED NOT DETECTED   Streptococcus pyogenes NOT DETECTED NOT DETECTED   A.calcoaceticus-baumannii NOT DETECTED NOT DETECTED   Bacteroides fragilis NOT DETECTED NOT DETECTED   Enterobacterales NOT DETECTED NOT DETECTED   Enterobacter cloacae complex NOT DETECTED NOT  DETECTED   Escherichia coli NOT DETECTED NOT DETECTED   Klebsiella aerogenes NOT DETECTED NOT DETECTED   Klebsiella oxytoca NOT DETECTED NOT DETECTED   Klebsiella pneumoniae NOT DETECTED NOT DETECTED   Proteus species NOT DETECTED NOT DETECTED   Salmonella species NOT DETECTED NOT DETECTED   Serratia marcescens NOT DETECTED NOT DETECTED   Haemophilus influenzae NOT DETECTED NOT DETECTED   Neisseria meningitidis NOT DETECTED NOT DETECTED   Pseudomonas aeruginosa NOT DETECTED NOT DETECTED   Stenotrophomonas maltophilia NOT DETECTED NOT DETECTED   Candida albicans NOT DETECTED NOT DETECTED   Candida auris NOT DETECTED NOT DETECTED   Candida glabrata NOT DETECTED NOT DETECTED   Candida krusei NOT DETECTED NOT DETECTED   Candida parapsilosis NOT DETECTED NOT DETECTED   Candida tropicalis NOT DETECTED NOT DETECTED   Cryptococcus neoformans/gattii NOT DETECTED NOT DETECTED  Urinalysis, Routine w reflex microscopic -Urine, Clean Catch   Collection Time: 01/11/24  5:50 AM  Result Value Ref Range   Color, Urine YELLOW (A) YELLOW   APPearance CLOUDY (A) CLEAR   Specific Gravity, Urine 1.016 1.005 - 1.030   pH 5.0 5.0 - 8.0   Glucose, UA NEGATIVE NEGATIVE mg/dL   Hgb urine dipstick NEGATIVE NEGATIVE   Bilirubin Urine NEGATIVE NEGATIVE   Ketones, ur NEGATIVE NEGATIVE mg/dL   Protein, ur 30 (A) NEGATIVE mg/dL   Nitrite NEGATIVE NEGATIVE   Leukocytes,Ua NEGATIVE NEGATIVE   RBC / HPF 0 0 - 5 RBC/hpf   WBC, UA 0-5 0 - 5 WBC/hpf   Bacteria, UA RARE (A) NONE SEEN   Squamous Epithelial / HPF 11-20 0 - 5 /HPF   Hyaline Casts, UA PRESENT   Urine Drug Screen, Qualitative ([facility] only)   Collection Time: 01/11/24  5:50 AM  Result Value Ref Range   Tricyclic, Ur Screen NONE DETECTED NONE DETECTED   Amphetamines, Ur Screen NONE DETECTED NONE DETECTED   MDMA (Ecstasy)Ur Screen NONE DETECTED NONE DETECTED   Cocaine Metabolite,Ur [place] POSITIVE (A) NONE DETECTED   Opiate, Ur Screen NONE DETECTED NONE  DETECTED  Phencyclidine (PCP) Ur S NONE DETECTED NONE DETECTED   Cannabinoid 50 Ng, Ur [place] NONE DETECTED NONE DETECTED   Barbiturates, Ur Screen NONE DETECTED NONE DETECTED   Benzodiazepine, Ur Scrn NONE DETECTED NONE DETECTED   Methadone Scn, Ur NONE DETECTED NONE DETECTED  Lactic acid, plasma   Collection Time: 01/11/24  5:50 AM  Result Value Ref Range   Lactic Acid, Venous 1.3 0.5 - 1.9 mmol/L  Blood culture (routine x 2)   Collection Time: 01/11/24  6:08 AM   Specimen: BLOOD  Result Value Ref Range   Specimen Description BLOOD RIGHT FA    Special Requests      BOTTLES DRAWN AEROBIC AND ANAEROBIC Blood Culture adequate volume   Culture      NO GROWTH 5 DAYS Performed at [facility], [address] [place], [place] [address]    Report Status 01/16/2024 FINAL   Troponin I (High Sensitivity)   Collection Time: 01/11/24  6:42 AM  Result Value Ref Range   Troponin I (High Sensitivity) 10 <18 ng/L  Strep pneumoniae urinary antigen   Collection Time: 01/11/24  9:55 AM  Result Value Ref Range   Strep Pneumo Urinary Antigen NEGATIVE NEGATIVE  Expectorated Sputum Assessment w Gram Stain, Rflx to Resp Cult   Collection Time: 01/11/24 12:00 PM   Specimen: Sputum  Result Value Ref Range   Specimen Description SPUTUM    Special Requests NONE    Sputum evaluation      THIS SPECIMEN IS ACCEPTABLE FOR SPUTUM CULTURE Performed at [facility], [address], [place], [place] [address]    Report Status 01/12/2024 FINAL   Culture, Respiratory w Gram Stain   Collection Time: 01/11/24 12:00 PM   Specimen: SPU  Result Value Ref Range   Specimen Description      SPUTUM Performed at [facility], [address] [place], [place] [address]    Special Requests      NONE Reflexed from [id number] Performed at [facility], [address] [place], [place] [address]    Gram Stain      RARE WBC PRESENT, PREDOMINANTLY PMN RARE GRAM NEGATIVE  RODS Performed at [facility], [address], [place], [place] [address]    Culture FEW PSEUDOMONAS AERUGINOSA    Report Status 01/15/2024 FINAL    Organism ID, Bacteria PSEUDOMONAS AERUGINOSA       Susceptibility   Pseudomonas aeruginosa - MIC*    MEROPENEM <=0.25 SENSITIVE Sensitive     CIPROFLOXACIN 0.5 SENSITIVE Sensitive     IMIPENEM 8 RESISTANT Resistant     CEFTAZIDIME/AVIBACTAM 2 SENSITIVE Sensitive     CEFTOLOZANE/TAZOBACTAM 1 SENSITIVE Sensitive     TOBRAMYCIN <=1 SENSITIVE Sensitive     * FEW PSEUDOMONAS AERUGINOSA  CBC   Collection Time: 01/12/24  5:19 AM  Result Value Ref Range   WBC 16.4 (H) 4.0 - 10.5 K/uL   RBC 3.35 (L) 3.87 - 5.11 MIL/uL   Hemoglobin 10.2 (L) 12.0 - 15.0 g/dL   HCT 69.1 (L) 63.9 - 53.9 %   MCV 91.9 80.0 - 100.0 fL   MCH 30.4 26.0 - 34.0 pg   MCHC 33.1 30.0 - 36.0 g/dL   RDW 86.7 88.4 - 84.4 %   Platelets 302 150 - 400 K/uL   nRBC 0.0 0.0 - 0.2 %  Basic metabolic panel   Collection Time: 01/12/24  5:19 AM  Result Value Ref Range   Sodium 142 135 - 145 mmol/L  Potassium 4.0 3.5 - 5.1 mmol/L   Chloride 112 (H) 98 - 111 mmol/L   CO2 17 (L) 22 - 32 mmol/L   Glucose, Bld 153 (H) 70 - 99 mg/dL   BUN 44 (H) 8 - 23 mg/dL   Creatinine, Ser 8.27 (H) 0.44 - 1.00 mg/dL   Calcium  8.2 (L) 8.9 - 10.3 mg/dL   GFR, Estimated 31 (L) >60 mL/min   Anion gap 15 5 - 15  CBC   Collection Time: 01/13/24  5:17 AM  Result Value Ref Range   WBC 16.0 (H) 4.0 - 10.5 K/uL   RBC 3.56 (L) 3.87 - 5.11 MIL/uL   Hemoglobin 10.4 (L) 12.0 - 15.0 g/dL   HCT 67.2 (L) 63.9 - 53.9 %   MCV 91.9 80.0 - 100.0 fL   MCH 29.2 26.0 - 34.0 pg   MCHC 31.8 30.0 - 36.0 g/dL   RDW 86.5 88.4 - 84.4 %   Platelets 353 150 - 400 K/uL   nRBC 0.0 0.0 - 0.2 %  Basic metabolic panel with GFR   Collection Time: 01/13/24  5:17 AM  Result Value Ref Range   Sodium 142 135 - 145 mmol/L   Potassium 4.2 3.5 - 5.1 mmol/L   Chloride 110 98 - 111 mmol/L   CO2 22 22 - 32 mmol/L    Glucose, Bld 130 (H) 70 - 99 mg/dL   BUN 34 (H) 8 - 23 mg/dL   Creatinine, Ser 8.56 (H) 0.44 - 1.00 mg/dL   Calcium  8.9 8.9 - 10.3 mg/dL   GFR, Estimated 39 (L) >60 mL/min   Anion gap 10 5 - 15      Assessment & Plan:   Problem List Items Addressed This Visit       Cardiovascular and Mediastinum   Essential hypertension   Chronic, stable.  BP at goal today. Continue Telmisartan  20 MG daily at this time due to tighter control with higher doses.  This is offering benefit to proteinuria, urine ALB 150 May 2025.  Recommend checking BP at home 3 mornings a week and documenting for provider + DASH diet.  LABS: CBC, TSH, CMP.  Recommend complete cessation of cocaine use and smoking.           Respiratory   Simple chronic bronchitis (HCC)   Chronic, ongoing.  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 Continue Breztri and Albuterol . Recommend complete smoking cessation, continue Wellbutrin  which has helped with reduction.  Refuses lung CT CA screening at this time, but provided information on this.  Has pulmonary referral from hospital, but continues to refuse to go.  Reports would not pursue treatment if cancer presented or anything else.  Spirometry next visit.   - Current exacerbation, will treat with Augmentin  and Prednisone , which has worked well in past.      Relevant Orders   CBC with Differential/Platelet   CAP (community acquired pneumonia)   Refer to sepsis plan of care.      Relevant Medications   amoxicillin -clavulanate (AUGMENTIN ) 875-125 MG tablet     Endocrine   Hypothyroidism   Ongoing and stable.  Started medication on 09/03/20.  Taking Levothyroxine  daily, continue this and adjust as needed.  Labs today.        Nervous and Auditory   Nicotine dependence, cigarettes, w unsp disorders   I have recommended complete cessation of tobacco use. She continues on Wellbutrin , which has offered some benefit.  Refuses CT lung screening.  Genitourinary   Chronic kidney  disease (CKD), stage III (moderate) (HCC)   Chronic, stable.  Followed by nephrology in past, last labs and notes reviewed -- she does not want to return at this time as reports she won't do what they tell her.  CrCl 31 based on recent labs -- maintain current Gabapentin  dosing but decrease if drops <30.  Refuses to return to nephrology at this time.  Continue on ARB daily.      Relevant Orders   Comprehensive metabolic panel with GFR     Other   Sepsis (HCC) - Primary   Acute, was improving but does endorse a bit more cough recently and wheezing. Will send in Augmentin  BID for 7 days and Prednisone  40 MG daily for 5 days. Plan on rechecking CXR in 4 weeks. Labs today.      Relevant Orders   Ferritin   Iron   CBC with Differential/Platelet   Hyperlipidemia   Chronic, ongoing.  Continue current medication regimen and adjust as needed.  Lipid panel today.      Relevant Orders   Comprehensive metabolic panel with GFR   Lipid Panel w/o Chol/HDL Ratio   Elevated hemoglobin A1c   A1c 5.6% today, remaining stable.  Recommend continued focus on diet and regular activity.  Continues to maintain her previous weight loss.      Relevant Orders   Bayer DCA Hb A1c Waived   Depression, major, recurrent, moderate (HCC)   Ongoing, stable.  Denies SI/HI.  She has reduced cocaine and alcohol  use with Wellbutrin  on board.  Continue current medication regimen and adjust as needed.  Refills sent in as needed.  To alert provider or go immediately to ER if SI/HI presents.        Cocaine abuse (HCC)   Had been cutting back on use with Wellbutrin  on board and feeling better, but is still using weekly.  Praised for reduction of use and continue to recommend complete cessation.  BP trended down with less use.  Have discussed at length high risk for cardiac death or stroke with continued cocaine use + risk for cocaine being mixed with something that could cause harm to her.  Have highly advised completed  cessation.      Other Visit Diagnoses       Flu vaccine need       Relevant Orders   Flu vaccine HIGH DOSE PF(Fluzone Trivalent) (Completed)     Encounter for annual physical exam       Annual exam with health maintenance reviewed.        Follow up plan: Return in about 4 weeks (around 03/13/2024) for Pneumonia -- will need x-ray recheck.   LABORATORY TESTING:  - Pap smear: not applicable  IMMUNIZATIONS:   - Tdap: Tetanus vaccination status reviewed: last tetanus booster within 10 years. - Influenza: Provided today - Pneumovax: Up To Date - Prevnar: Up To Date - COVID: Refused - HPV: Not applicable - Shingrix vaccine: Refused  SCREENING: -Mammogram: Refused  - Colonoscopy: Refused  - Bone Density: Refused  -Hearing Test: Not applicable  -Spirometry: Refused   PATIENT COUNSELING:   Advised to take 1 mg of folate supplement per day if capable of pregnancy.   Sexuality: Discussed sexually transmitted diseases, partner selection, use of condoms, avoidance of unintended pregnancy  and contraceptive alternatives.   Advised to avoid cigarette smoking.  I discussed with the patient that most people either abstain from alcohol  or drink within safe limits (<=14/week  and <=4 drinks/occasion for males, <=7/weeks and <= 3 drinks/occasion for females) and that the risk for alcohol  disorders and other health effects rises proportionally with the number of drinks per week and how often a drinker exceeds daily limits.  Discussed cessation/primary prevention of drug use and availability of treatment for abuse.   Diet: Encouraged to adjust caloric intake to maintain  or achieve ideal body weight, to reduce intake of dietary saturated fat and total fat, to limit sodium intake by avoiding high sodium foods and not adding table salt, and to maintain adequate dietary potassium and calcium  preferably from fresh fruits, vegetables, and low-fat dairy products.    Stressed the importance of  regular exercise  Injury prevention: Discussed safety belts, safety helmets, smoke detector, smoking near bedding or upholstery.   Dental health: Discussed importance of regular tooth brushing, flossing, and dental visits.    NEXT PREVENTATIVE PHYSICAL DUE IN 1 YEAR. Return in about 4 weeks (around 03/13/2024) for Pneumonia -- will need x-ray recheck.

## 2024-02-14 NOTE — Assessment & Plan Note (Signed)
 I have recommended complete cessation of tobacco use. She continues on Wellbutrin , which has offered some benefit.  Refuses CT lung screening.

## 2024-02-14 NOTE — Assessment & Plan Note (Signed)
 Chronic, stable.  Followed by nephrology in past, last labs and notes reviewed -- she does not want to return at this time as reports she won't do what they tell her.  CrCl 31 based on recent labs -- maintain current Gabapentin  dosing but decrease if drops <30.  Refuses to return to nephrology at this time.  Continue on ARB daily.

## 2024-02-14 NOTE — Assessment & Plan Note (Signed)
 Chronic, stable.  BP at goal today. Continue Telmisartan  20 MG daily at this time due to tighter control with higher doses.  This is offering benefit to proteinuria, urine ALB 150 May 2025.  Recommend checking BP at home 3 mornings a week and documenting for provider + DASH diet.  LABS: CBC, TSH, CMP.  Recommend complete cessation of cocaine use and smoking.

## 2024-02-14 NOTE — Assessment & Plan Note (Signed)
Refer to sepsis plan of care.

## 2024-02-14 NOTE — Assessment & Plan Note (Signed)
 Ongoing, stable.  Denies SI/HI.  She has reduced cocaine and alcohol  use with Wellbutrin  on board.  Continue current medication regimen and adjust as needed.  Refills sent in as needed.  To alert provider or go immediately to ER if SI/HI presents.

## 2024-02-15 ENCOUNTER — Telehealth: Payer: Self-pay

## 2024-02-15 ENCOUNTER — Ambulatory Visit: Payer: Self-pay | Admitting: Nurse Practitioner

## 2024-02-15 LAB — CBC WITH DIFFERENTIAL/PLATELET
Basophils Absolute: 0.1 x10E3/uL (ref 0.0–0.2)
Basos: 1 %
EOS (ABSOLUTE): 0.2 x10E3/uL (ref 0.0–0.4)
Eos: 3 %
Hematocrit: 38.1 % (ref 34.0–46.6)
Hemoglobin: 12.2 g/dL (ref 11.1–15.9)
Immature Grans (Abs): 0 x10E3/uL (ref 0.0–0.1)
Immature Granulocytes: 0 %
Lymphocytes Absolute: 2.9 x10E3/uL (ref 0.7–3.1)
Lymphs: 36 %
MCH: 29.3 pg (ref 26.6–33.0)
MCHC: 32 g/dL (ref 31.5–35.7)
MCV: 91 fL (ref 79–97)
Monocytes Absolute: 1 x10E3/uL — ABNORMAL HIGH (ref 0.1–0.9)
Monocytes: 12 %
Neutrophils Absolute: 3.8 x10E3/uL (ref 1.4–7.0)
Neutrophils: 48 %
Platelets: 347 x10E3/uL (ref 150–450)
RBC: 4.17 x10E6/uL (ref 3.77–5.28)
RDW: 13 % (ref 11.7–15.4)
WBC: 8 x10E3/uL (ref 3.4–10.8)

## 2024-02-15 LAB — COMPREHENSIVE METABOLIC PANEL WITH GFR
ALT: 16 IU/L (ref 0–32)
AST: 26 IU/L (ref 0–40)
Albumin: 4.1 g/dL (ref 3.8–4.8)
Alkaline Phosphatase: 95 IU/L (ref 49–135)
BUN/Creatinine Ratio: 10 — ABNORMAL LOW (ref 12–28)
BUN: 13 mg/dL (ref 8–27)
Bilirubin Total: 0.2 mg/dL (ref 0.0–1.2)
CO2: 23 mmol/L (ref 20–29)
Calcium: 9 mg/dL (ref 8.7–10.3)
Chloride: 104 mmol/L (ref 96–106)
Creatinine, Ser: 1.26 mg/dL — ABNORMAL HIGH (ref 0.57–1.00)
Globulin, Total: 2.2 g/dL (ref 1.5–4.5)
Glucose: 102 mg/dL — ABNORMAL HIGH (ref 70–99)
Potassium: 4.1 mmol/L (ref 3.5–5.2)
Sodium: 140 mmol/L (ref 134–144)
Total Protein: 6.3 g/dL (ref 6.0–8.5)
eGFR: 46 mL/min/1.73 — ABNORMAL LOW (ref >59)

## 2024-02-15 LAB — LIPID PANEL W/O CHOL/HDL RATIO
Cholesterol, Total: 150 mg/dL (ref 100–199)
HDL: 69 mg/dL (ref >39)
LDL Chol Calc (NIH): 65 mg/dL (ref 0–99)
Triglycerides: 86 mg/dL (ref 0–149)
VLDL Cholesterol Cal: 16 mg/dL (ref 5–40)

## 2024-02-15 LAB — FERRITIN: Ferritin: 98 ng/mL (ref 15–150)

## 2024-02-15 LAB — IRON: Iron: 59 ug/dL (ref 27–139)

## 2024-02-15 NOTE — Telephone Encounter (Signed)
 Gave pt a call pt is coming up due for re-enrollment on [facility] Breztri, spoke with pt today explain she received pap from [facility] took it to provider office today.

## 2024-02-15 NOTE — Progress Notes (Signed)
 Please let Kristen Mills know her labs have returned: - Kidney function continues to show Stage 3a kidney disease, ensure good hydration at home and avoid Ibuprofen products + reduce cocaine use as we discuss. - Blood counts much improved from the hospital and iron level normal. - Lipid panel stable, continue your statin therapy. Any questions? Keep being amazing!!  Thank you for allowing me to participate in your care.  I appreciate you. Kindest regards, Marianela Mandrell

## 2024-03-10 NOTE — Patient Instructions (Signed)

## 2024-03-15 ENCOUNTER — Encounter: Payer: Self-pay | Admitting: Nurse Practitioner

## 2024-03-15 ENCOUNTER — Ambulatory Visit: Admitting: Nurse Practitioner

## 2024-03-15 VITALS — BP 122/68 | HR 96 | Temp 97.8°F | Resp 20 | Ht 60.0 in | Wt 133.0 lb

## 2024-03-15 DIAGNOSIS — Z0000 Encounter for general adult medical examination without abnormal findings: Secondary | ICD-10-CM | POA: Diagnosis not present

## 2024-03-15 DIAGNOSIS — J441 Chronic obstructive pulmonary disease with (acute) exacerbation: Secondary | ICD-10-CM | POA: Diagnosis not present

## 2024-03-15 DIAGNOSIS — F17219 Nicotine dependence, cigarettes, with unspecified nicotine-induced disorders: Secondary | ICD-10-CM

## 2024-03-15 DIAGNOSIS — Z599 Problem related to housing and economic circumstances, unspecified: Secondary | ICD-10-CM | POA: Diagnosis not present

## 2024-03-15 DIAGNOSIS — J189 Pneumonia, unspecified organism: Secondary | ICD-10-CM | POA: Diagnosis not present

## 2024-03-15 MED ORDER — AMOXICILLIN-POT CLAVULANATE 875-125 MG PO TABS: 1.0000 | ORAL_TABLET | Freq: Two times a day (BID) | ORAL | 0 refills | Status: AC

## 2024-03-15 MED ORDER — IPRATROPIUM-ALBUTEROL 0.5-2.5 (3) MG/3ML IN SOLN
3.0000 mL | Freq: Once | RESPIRATORY_TRACT | Status: AC
  Administered 2024-03-15: 3 mL via RESPIRATORY_TRACT

## 2024-03-15 MED ORDER — BREZTRI AEROSPHERE 160-9-4.8 MCG/ACT IN AERO: 2.0000 | INHALATION_SPRAY | Freq: Two times a day (BID) | RESPIRATORY_TRACT | 5 refills | Status: DC

## 2024-03-15 MED ORDER — PREDNISONE 20 MG PO TABS: 40.0000 mg | ORAL_TABLET | Freq: Every day | ORAL | 0 refills | Status: AC

## 2024-03-15 NOTE — Assessment & Plan Note (Signed)
 I have recommended complete cessation of tobacco use. She continues on Wellbutrin , which has offered some benefit.  Refuses CT lung screening.

## 2024-03-15 NOTE — Progress Notes (Signed)
 "  Chief Complaint  Patient presents with   Pneumonia    Still wheezing but does not have her Breztri  right now.    Medicare Wellness     Subjective:   Kristen Mills is a 73 y.o. female who presents for a Medicare Annual Wellness Visit and follow-up visit.  Visit info / Clinical Intake: Medicare Wellness Visit Type:: Subsequent Annual Wellness Visit Persons participating in visit and providing information:: patient Medicare Wellness Visit Mode:: In-person (required for WTM) Interpreter Needed?: No Pre-visit prep was completed: yes AWV questionnaire completed by patient prior to visit?: no Living arrangements:: (!) lives alone Patient's Overall Health Status Rating: (!) fair Typical amount of pain: (!) a lot Does pain affect daily life?: (!) yes (Walking can be difficult.) Are you currently prescribed opioids?: no  Dietary Habits and Nutritional Risks How many meals a day?: 2 Eats fruit and vegetables daily?: (!) no Most meals are obtained by: preparing own meals In the last 2 weeks, have you had any of the following?: unintentional weight gain Diabetic:: no  Functional Status Activities of Daily Living (to include ambulation/medication): Independent Ambulation: Independent Medication Administration: Independent Home Management (perform basic housework or laundry): Independent Manage your own finances?: yes Primary transportation is: family / friends Concerns about vision?: no *vision screening is required for WTM* Concerns about hearing?: (!) yes Uses hearing aids?: no Hear whispered voice?: (!) no *in-person visit only*  Fall Screening Falls in the past year?: 0 Number of falls in past year: 1 Was there an injury with Fall?: 0 Fall Risk Category Calculator: 1 Patient Fall Risk Level: Low Fall Risk  Fall Risk Patient at Risk for Falls Due to: No Fall Risks Fall risk Follow up: Falls evaluation completed  Home and Transportation Safety: All rugs have non-skid  backing?: (!) no All stairs or steps have railings?: (!) no Grab bars in the bathtub or shower?: (!) no Have non-skid surface in bathtub or shower?: yes (It is slick.) Good home lighting?: (!) no Regular seat belt use?: yes Hospital stays in the last year:: (!) yes (4 days in October.) How many hospital stays:: 4 Reason: Pneumonia  Cognitive Assessment Difficulty concentrating, remembering, or making decisions? : no Will 6CIT or Mini Cog be Completed: yes What year is it?: 0 points What month is it?: 0 points Give patient an address phrase to remember (5 components): [address] [place] [id number] About what time is it?: 0 points Count backwards from 20 to 1: 0 points Say the months of the year in reverse: 2 points Repeat the address phrase from earlier: 10 points 6 CIT Score: 12 points  Advance Directives (For Healthcare) Does Patient Have a Medical Advance Directive?: No Would patient like information on creating a medical advance directive?: Yes (MAU/Ambulatory/Procedural Areas - Information given)  Reviewed/Updated  Reviewed/Updated: Medical History; Surgical History; Reviewed All (Medical, Surgical, Family, Medications, Allergies, Care Teams, Patient Goals); Family History; Medications; Allergies; Care Teams; Patient Goals    Allergies (verified) Patient has no known allergies.   Current Medications (verified) Outpatient Encounter Medications as of 03/15/2024  Medication Sig   albuterol  (VENTOLIN  HFA) 108 (90 Base) MCG/ACT inhaler INHALE 2 PUFFS EVERY 4 TO 6 HOURS AS NEEDED FOR SHORTNESS OF BREATH   atorvastatin  (LIPITOR) 20 MG tablet Take 1 tablet (20 mg total) by mouth daily.   buPROPion  (WELLBUTRIN  XL) 300 MG 24 hr tablet Take 1 tablet (300 mg total) by mouth daily.   Cholecalciferol  1.25 MG (50000 UT) capsule  Take 1 capsule (50,000 Units total) by mouth once a week.   gabapentin  (NEURONTIN ) 300 MG capsule Take 1 capsule (300 mg total) by mouth 3 (three) times daily.    hydrOXYzine  (ATARAX ) 10 MG tablet Take 1 tablet (10 mg total) by mouth 2 (two) times daily as needed.   levothyroxine  (SYNTHROID ) 50 MCG tablet Take 1 tablet (50 mcg total) by mouth daily.   nicotine  (NICODERM CQ  - DOSED IN MG/24 HOURS) 21 mg/24hr patch Place 1 patch (21 mg total) onto the skin daily.   telmisartan  (MICARDIS ) 20 MG tablet Take 1 tablet (20 mg total) by mouth daily.   traZODone  (DESYREL ) 50 MG tablet Take 1 tablet (50 mg total) by mouth at bedtime.   [DISCONTINUED] budesonide -glycopyrrolate -formoterol  (BREZTRI  AEROSPHERE) 160-9-4.8 MCG/ACT AERO inhaler Inhale 2 puffs into the lungs in the morning and at bedtime.   [DISCONTINUED] levofloxacin  (LEVAQUIN ) 750 MG tablet Take 1 tablet (750 mg total) by mouth every other day. (Next dose is due on Tuesday 01/16/24)   budesonide -glycopyrrolate -formoterol  (BREZTRI  AEROSPHERE) 160-9-4.8 MCG/ACT AERO inhaler Inhale 2 puffs into the lungs in the morning and at bedtime.   No facility-administered encounter medications on file as of 03/15/2024.    History: Past Medical History:  Diagnosis Date   Asthma    Cancer (HCC)    Uterine Cancer   COPD (chronic obstructive pulmonary disease) (HCC)    Depression    Diabetes mellitus without complication (HCC)    Hyperlipidemia    Hypertension    Insomnia    Polysubstance abuse (HCC)    Spinal stenosis    Thyroid  condition    Past Surgical History:  Procedure Laterality Date   ABDOMINAL HYSTERECTOMY     CATARACT EXTRACTION Bilateral    12/26/22 and 01/09/23   Family History  Problem Relation Age of Onset   Cancer Mother        Lung   Cancer Father        Lung   Heart disease Sister    Heart attack Sister    Cancer Brother        Lung   Bipolar disorder Son    Social History   Occupational History   Not on file  Tobacco Use   Smoking status: Every Day    Current packs/day: 1.00    Average packs/day: 1 pack/day for 45.0 years (45.0 ttl pk-yrs)    Types: Cigarettes    Smokeless tobacco: Never  Vaping Use   Vaping status: Former  Substance and Sexual Activity   Alcohol  use: Yes    Alcohol /week: 3.0 standard drinks of alcohol     Types: 3 Cans of beer per week    Comment: Few beers daily.   Drug use: Yes    Types: Crack cocaine, Marijuana, Cocaine    Comment: currently cocaine   Sexual activity: Not Currently   Tobacco Counseling Ready to quit: Not Answered Counseling given: Not Answered  SDOH Screenings   Food Insecurity: No Food Insecurity (01/11/2024)  Housing: Low Risk (01/11/2024)  Transportation Needs: No Transportation Needs (01/11/2024)  Utilities: At Risk (01/12/2024)  Alcohol  Screen: Low Risk (01/20/2023)  Depression (PHQ2-9): Low Risk (03/15/2024)  Financial Resource Strain: Medium Risk (01/20/2023)  Physical Activity: Inactive (01/20/2023)  Social Connections: Patient Unable To Answer (01/11/2024)  Stress: Stress Concern Present (01/20/2023)  Tobacco Use: High Risk (03/15/2024)  Health Literacy: Adequate Health Literacy (01/20/2023)   See flowsheets for full screening details  Depression Screen PHQ 2 & 9 Depression Scale- Over the past 2  weeks, how often have you been bothered by any of the following problems? Little interest or pleasure in doing things: 1 Feeling down, depressed, or hopeless (PHQ Adolescent also includes...irritable): 0 PHQ-2 Total Score: 1 Trouble falling or staying asleep, or sleeping too much: 0 Feeling tired or having little energy: 3 Poor appetite or overeating (PHQ Adolescent also includes...weight loss): 0 Feeling bad about yourself - or that you are a failure or have let yourself or your family down: 0 Trouble concentrating on things, such as reading the newspaper or watching television (PHQ Adolescent also includes...like school work): 0 Moving or speaking so slowly that other people could have noticed. Or the opposite - being so fidgety or restless that you have been moving around a lot more than  usual: 0 Thoughts that you would be better off dead, or of hurting yourself in some way: 0 PHQ-9 Total Score: 4 If you checked off any problems, how difficult have these problems made it for you to do your work, take care of things at home, or get along with other people?: Not difficult at all  Depression Treatment Depression Interventions/Treatment : EYV7-0 Score <4 Follow-up Not Indicated     Goals Addressed               This Visit's Progress     PharmD I have a lot of medications (pt-stated)   On track     Current Barriers:  Polypharmacy; complex patient with multiple comorbidities including polysubstance abuse, COPD, HLD Reports that she is receiving her second delivery of food. Extremely appreciative Indicates improvement in mood with start of bupropion . No SI/HI. Only used substances once in the past 2 weeks, and reports it was just a little bit Partial Medicare Extra Help Notes that she is having a difficult time affording food lately.  COPD: Stiolto 2.5/2.5 mcg daily, albuterol  PRN. Read in the package insert that this medication should not be used for asthma w/o a steroid Depression/anxiety and insomnia: trazodone  50 mg QPM, hydroxyzine  10 mg BID: bupropion  XL 150 mg daily  ASCVD risk reduction: amlodipine  5 mg daily (BP well controlled today); atorvastatin  10 mg daily (LDL at goal <70) Allergies: montelukast  10 mg daily- has not been taking lately, discussed d/c Health maintenance: Vitamin D  50,000 units weekly.   Pharmacist Clinical Goal(s):  Over the next 90 days, patient will work with PharmD and provider towards optimized medication management  Interventions: Praised patient for being agreeable to food security and mood intervention.  Celebrated improved mood. Encouraged the importance of follow up with PCP and continued support Reviewed that last spirometry showed obstructive lung disease, not restrictive, and no reversibility, therefore LABA/LAMA for COPD is  appropriate treatment. She verbalized understanding.   Patient Self Care Activities:  Patient will take medications as prescribed  Please see past updates related to this goal by clicking on the Past Updates button in the selected goal        Stop or Cut Down Tobacco Use   On track     Timeframe:  Short-Term Goal Priority:  High Start Date:                             Expected End Date:                       Follow Up Date 2 month follow up    - change or avoid triggers like smoky places,  drinking alcohol  and other smokers - cut down number of cigarettes by one-half - drink 4-6 glasses of water each day - join a support group - use prescription medicine to decrease cravings and withdrawal - use Quit [employer] Now    Why is this important?   To stop or cut down it is important to have support from a person or group of people who you can count on.  You will also need to think about the things that make you feel like smoking, then plan for how to handle them.    Notes:       Track and Manage My Blood Pressure-Hypertension   On track     Timeframe:  Short-Term Goal Priority:  High Start Date:                             Expected End Date:                       Follow Up Date 2 month follow up    - check blood pressure 3 times per week - choose a place to take my blood pressure (home, clinic or office, retail store) - write blood pressure results in a log or diary    Why is this important?   You won't feel high blood pressure, but it can still hurt your blood vessels.  High blood pressure can cause heart or kidney problems. It can also cause a stroke.  Making lifestyle changes like losing a little weight or eating less salt will help.  Checking your blood pressure at home and at different times of the day can help to control blood pressure.  If the doctor prescribes medicine remember to take it the way the doctor ordered.  Call the office if you cannot afford the  medicine or if there are questions about it.     Notes:              Objective:    Today's Vitals   03/15/24 1401  BP: (!) 144/77  Pulse: 96  Resp: 20  Temp: 97.8 F (36.6 C)  TempSrc: Oral  SpO2: 94%  Weight: 133 lb (60.3 kg)  Height: 5' (1.524 m)  PainSc: 0-No pain   Body mass index is 25.97 kg/m.  Hearing/Vision screen No results found. Immunizations and Health Maintenance Health Maintenance  Topic Date Due   Medicare Annual Wellness (AWV)  01/20/2024   COVID-19 Vaccine (3 - 2025-26 season) 03/26/2024 (Originally 11/27/2023)   Zoster Vaccines- Shingrix (1 of 2) 05/13/2024 (Originally 02/22/2002)   Lung Cancer Screening  08/06/2024 (Originally 01/04/2006)   Colonoscopy  02/10/2025 (Originally 02/22/1997)   Mammogram  02/13/2025 (Originally 02/23/1992)   Bone Density Scan  02/13/2025 (Originally 02/22/2017)   OPHTHALMOLOGY EXAM  07/03/2024   Diabetic kidney evaluation - Urine ACR  08/06/2024   HEMOGLOBIN A1C  08/13/2024   Diabetic kidney evaluation - eGFR measurement  02/13/2025   DTaP/Tdap/Td (2 - Td or Tdap) 06/20/2026   Pneumococcal Vaccine: 50+ Years  Completed   Influenza Vaccine  Completed   Hepatitis C Screening  Completed   Meningococcal B Vaccine  Aged Out   FOOT EXAM  Discontinued        Assessment/Plan:  This is a routine wellness examination for Kristen Mills.  Patient Care Team: Cannady, Jolene T, NP as PCP - General (Nurse Practitioner)  I have personally reviewed and noted the following in  the patients chart:   Medical and social history Use of alcohol , tobacco or illicit drugs  Current medications and supplements including opioid prescriptions. Functional ability and status Nutritional status Physical activity Advanced directives List of other physicians Hospitalizations, surgeries, and ER visits in previous 12 months Vitals Screenings to include cognitive, depression, and falls Referrals and appointments  No orders of the defined  types were placed in this encounter.  In addition, I have reviewed and discussed with patient certain preventive protocols, quality metrics, and best practice recommendations. A written personalized care plan for preventive services as well as general preventive health recommendations were provided to patient.   Breta Demedeiros, CMA   03/15/2024   No follow-ups on file.  After Visit Summary: (In Person-Printed) AVS printed and given to the patient  Nurse Notes:  "

## 2024-03-15 NOTE — Assessment & Plan Note (Signed)
 Refer to COPD exacerbation plan of care.

## 2024-03-15 NOTE — Assessment & Plan Note (Signed)
 Acute exacerbation present as has been out of Breztri , she obtains this via assistance. Will reach out to [facility] on this. Start Augmentin  BID for 7 days and Prednisone  40 MG daily for 5 days. Provided x 2 samples of Breztri  (LOT [id number] and Exp 2027-10). Will see about assistance program updates. Use Breztri  as ordered and Albuterol  as needed. Recommend complete cessation of smoking.

## 2024-03-15 NOTE — Progress Notes (Signed)
 "  BP 122/68 (BP Location: Left Arm, Patient Position: Sitting, Cuff Size: Normal)   Pulse 96   Temp 97.8 F (36.6 C) (Oral)   Resp 20   Ht 5' (1.524 m)   Wt 133 lb (60.3 kg)   SpO2 94%   BMI 25.97 kg/m    Subjective:    Patient ID: Kristen Mills, female    DOB: December 22, 1950, 73 y.o.   MRN: [id number]  HPI: Kristen Mills is a 73 y.o. female  Chief Complaint  Patient presents with   Pneumonia    Still wheezing but does not have her Breztri  right now.    Medicare Wellness   COPD WITH RECENT PNEUMONIA Is to be taking Breztri  and Albuterol . Has been out of Breztri  and has not heard from the assistance team. Admission for pneumonia on 01/11/24.  Uses oxygen at night only COPD status: uncontrolled has been out of Breztri  Satisfied with current treatment?: yes Oxygen use: at night only 3 L Dyspnea frequency: a little bit  Cough frequency: has not had Breztri  so coughing more Rescue inhaler frequency:  a whole lot since has been out of Breztri  Limitation of activity: no Productive cough: yes Last Spirometry: August 2020 Pneumovax: Up to Date Influenza: Up to Date  Relevant past medical, surgical, family and social history reviewed and updated as indicated. Interim medical history since our last visit reviewed. Allergies and medications reviewed and updated.  Review of Systems  Constitutional:  Negative for activity change, appetite change, diaphoresis, fatigue and fever.  Respiratory:  Positive for cough, chest tightness, shortness of breath and wheezing.   Cardiovascular:  Negative for chest pain, palpitations and leg swelling.  Gastrointestinal: Negative.   Neurological: Negative.   Psychiatric/Behavioral: Negative.      Per HPI unless specifically indicated above     Objective:    BP 122/68 (BP Location: Left Arm, Patient Position: Sitting, Cuff Size: Normal)   Pulse 96   Temp 97.8 F (36.6 C) (Oral)   Resp 20   Ht 5' (1.524 m)   Wt 133 lb (60.3 kg)   SpO2 94%    BMI 25.97 kg/m   Wt Readings from Last 3 Encounters:  03/15/24 133 lb (60.3 kg)  02/14/24 121 lb (54.9 kg)  01/11/24 125 lb 10.6 oz (57 kg)    Physical Exam Vitals and nursing note reviewed.  Constitutional:      General: She is awake. She is not in acute distress.    Appearance: She is well-developed and well-groomed. She is not ill-appearing or toxic-appearing.  HENT:     Head: Normocephalic.     Right Ear: Hearing and external ear normal.     Left Ear: Hearing and external ear normal.  Eyes:     General: Lids are normal.        Right eye: No discharge.        Left eye: No discharge.     Conjunctiva/sclera: Conjunctivae normal.     Pupils: Pupils are equal, round, and reactive to light.  Neck:     Thyroid : No thyromegaly.     Vascular: No carotid bruit.  Cardiovascular:     Rate and Rhythm: Normal rate and regular rhythm.     Heart sounds: Normal heart sounds. No murmur heard.    No gallop.  Pulmonary:     Effort: Pulmonary effort is normal. No accessory muscle usage or respiratory distress.     Breath sounds: Wheezing present. No decreased breath sounds or  rales.     Comments: Expiratory wheezes noted throughout. Abdominal:     General: Bowel sounds are normal. There is no distension.     Palpations: Abdomen is soft.     Tenderness: There is no abdominal tenderness.  Musculoskeletal:     Cervical back: Normal range of motion and neck supple.     Right lower leg: No edema.     Left lower leg: No edema.  Lymphadenopathy:     Head:     Right side of head: No submental, submandibular, tonsillar, preauricular, posterior auricular or occipital adenopathy.     Left side of head: No submental, submandibular, tonsillar, preauricular, posterior auricular or occipital adenopathy.     Cervical: No cervical adenopathy.  Skin:    General: Skin is warm and dry.  Neurological:     Mental Status: She is alert and oriented to person, place, and time.     Deep Tendon Reflexes:  Reflexes are normal and symmetric.     Reflex Scores:      Brachioradialis reflexes are 2+ on the right side and 2+ on the left side.      Patellar reflexes are 2+ on the right side and 2+ on the left side. Psychiatric:        Attention and Perception: Attention normal.        Mood and Affect: Mood normal.        Speech: Speech normal.        Behavior: Behavior normal. Behavior is cooperative.        Thought Content: Thought content normal.     Results for orders placed or performed in visit on 02/14/24  Bayer DCA Hb A1c Waived   Collection Time: 02/14/24  1:37 PM  Result Value Ref Range   HB A1C (BAYER DCA - WAIVED) 5.6 4.8 - 5.6 %  Ferritin   Collection Time: 02/14/24  1:38 PM  Result Value Ref Range   Ferritin 98 15 - 150 ng/mL  Iron   Collection Time: 02/14/24  1:38 PM  Result Value Ref Range   Iron 59 27 - 139 ug/dL  CBC with Differential/Platelet   Collection Time: 02/14/24  1:38 PM  Result Value Ref Range   WBC 8.0 3.4 - 10.8 x10E3/uL   RBC 4.17 3.77 - 5.28 x10E6/uL   Hemoglobin 12.2 11.1 - 15.9 g/dL   Hematocrit 61.8 65.9 - 46.6 %   MCV 91 79 - 97 fL   MCH 29.3 26.6 - 33.0 pg   MCHC 32.0 31.5 - 35.7 g/dL   RDW 86.9 88.2 - 84.5 %   Platelets 347 150 - 450 x10E3/uL   Neutrophils 48 Not Estab. %   Lymphs 36 Not Estab. %   Monocytes 12 Not Estab. %   Eos 3 Not Estab. %   Basos 1 Not Estab. %   Neutrophils Absolute 3.8 1.4 - 7.0 x10E3/uL   Lymphocytes Absolute 2.9 0.7 - 3.1 x10E3/uL   Monocytes Absolute 1.0 (H) 0.1 - 0.9 x10E3/uL   EOS (ABSOLUTE) 0.2 0.0 - 0.4 x10E3/uL   Basophils Absolute 0.1 0.0 - 0.2 x10E3/uL   Immature Granulocytes 0 Not Estab. %   Immature Grans (Abs) 0.0 0.0 - 0.1 x10E3/uL  Comprehensive metabolic panel with GFR   Collection Time: 02/14/24  1:38 PM  Result Value Ref Range   Glucose 102 (H) 70 - 99 mg/dL   BUN 13 8 - 27 mg/dL   Creatinine, Ser 8.73 (H) 0.57 - 1.00 mg/dL  eGFR 46 (L) >59 mL/min/1.73   BUN/Creatinine Ratio 10 (L) 12 -  28   Sodium 140 134 - 144 mmol/L   Potassium 4.1 3.5 - 5.2 mmol/L   Chloride 104 96 - 106 mmol/L   CO2 23 20 - 29 mmol/L   Calcium  9.0 8.7 - 10.3 mg/dL   Total Protein 6.3 6.0 - 8.5 g/dL   Albumin 4.1 3.8 - 4.8 g/dL   Globulin, Total 2.2 1.5 - 4.5 g/dL   Bilirubin Total 0.2 0.0 - 1.2 mg/dL   Alkaline Phosphatase 95 49 - 135 IU/L   AST 26 0 - 40 IU/L   ALT 16 0 - 32 IU/L  Lipid Panel w/o Chol/HDL Ratio   Collection Time: 02/14/24  1:38 PM  Result Value Ref Range   Cholesterol, Total 150 100 - 199 mg/dL   Triglycerides 86 0 - 149 mg/dL   HDL 69 >60 mg/dL   VLDL Cholesterol Cal 16 5 - 40 mg/dL   LDL Chol Calc (NIH) 65 0 - 99 mg/dL      Assessment & Plan:   Problem List Items Addressed This Visit       Respiratory   COPD exacerbation (HCC)   Acute exacerbation present as has been out of Breztri , she obtains this via assistance. Will reach out to [facility] on this. Start Augmentin  BID for 7 days and Prednisone  40 MG daily for 5 days. Provided x 2 samples of Breztri  (LOT [id number] and Exp 2027-10). Will see about assistance program updates. Use Breztri  as ordered and Albuterol  as needed. Recommend complete cessation of smoking.      Relevant Medications   budesonide -glycopyrrolate -formoterol  (BREZTRI  AEROSPHERE) 160-9-4.8 MCG/ACT AERO inhaler   ipratropium-albuterol  (DUONEB) 0.5-2.5 (3) MG/3ML nebulizer solution 3 mL   predniSONE  (DELTASONE ) 20 MG tablet   CAP (community acquired pneumonia)   Refer to COPD exacerbation plan of care.       Relevant Medications   budesonide -glycopyrrolate -formoterol  (BREZTRI  AEROSPHERE) 160-9-4.8 MCG/ACT AERO inhaler   ipratropium-albuterol  (DUONEB) 0.5-2.5 (3) MG/3ML nebulizer solution 3 mL   amoxicillin -clavulanate (AUGMENTIN ) 875-125 MG tablet     Nervous and Auditory   Nicotine  dependence, cigarettes, w unsp disorders   I have recommended complete cessation of tobacco use. She continues on Wellbutrin , which has offered some benefit.   Refuses CT lung screening.         Other   Financial difficulties   Referral to community care team, [facility].      Relevant Orders   AMB Referral [facility] Care Management   Other Visit Diagnoses       Encounter for Medicare annual wellness exam    -  Primary   Wellness exam today in office.        Follow up plan: Return in 2 weeks (on 03/29/2024) for COPD Exacerbation.       "

## 2024-03-15 NOTE — Assessment & Plan Note (Signed)
 Referral to community care team, [facility].

## 2024-03-15 NOTE — Progress Notes (Deleted)
 "  BP (!) 144/77 (BP Location: Left Arm, Patient Position: Sitting, Cuff Size: Normal)   Pulse 96   Temp 97.8 F (36.6 C) (Oral)   Resp 20   Ht 5' (1.524 m)   Wt 133 lb (60.3 kg)   SpO2 94%   BMI 25.97 kg/m    Subjective:    Patient ID: Kristen Mills, female    DOB: 31-Jul-1950, 73 y.o.   MRN: [id number]  HPI: Kristen Mills is a 73 y.o. female  Chief Complaint  Patient presents with   Pneumonia    Still wheezing but does not have her Breztri  right now.    Medicare Wellness   COPD WITH RECENT PNEUMONIA Is to be taking Breztri  and Albuterol . Has been out of Breztri . Admission for pneumonia on 01/11/24.   COPD status: {Blank single:19197::controlled,uncontrolled,better,worse,exacerbated,stable} Satisfied with current treatment?: {Blank single:19197::yes,no} Oxygen use: {Blank single:19197::yes,no} Dyspnea frequency:  Cough frequency:  Rescue inhaler frequency:   Limitation of activity: {Blank single:19197::yes,no} Productive cough:  Last Spirometry:  Pneumovax: {Blank single:19197::Up to Date,Not up to Date,unknown} Influenza: {Blank single:19197::Up to Date,Not up to Date,unknown}   Relevant past medical, surgical, family and social history reviewed and updated as indicated. Interim medical history since our last visit reviewed. Allergies and medications reviewed and updated.  Review of Systems  Per HPI unless specifically indicated above     Objective:    BP (!) 144/77 (BP Location: Left Arm, Patient Position: Sitting, Cuff Size: Normal)   Pulse 96   Temp 97.8 F (36.6 C) (Oral)   Resp 20   Ht 5' (1.524 m)   Wt 133 lb (60.3 kg)   SpO2 94%   BMI 25.97 kg/m   Wt Readings from Last 3 Encounters:  03/15/24 133 lb (60.3 kg)  02/14/24 121 lb (54.9 kg)  01/11/24 125 lb 10.6 oz (57 kg)    Physical Exam  Results for orders placed or performed in visit on 02/14/24  Bayer DCA Hb A1c Waived   Collection Time: 02/14/24  1:37  PM  Result Value Ref Range   HB A1C (BAYER DCA - WAIVED) 5.6 4.8 - 5.6 %  Ferritin   Collection Time: 02/14/24  1:38 PM  Result Value Ref Range   Ferritin 98 15 - 150 ng/mL  Iron   Collection Time: 02/14/24  1:38 PM  Result Value Ref Range   Iron 59 27 - 139 ug/dL  CBC with Differential/Platelet   Collection Time: 02/14/24  1:38 PM  Result Value Ref Range   WBC 8.0 3.4 - 10.8 x10E3/uL   RBC 4.17 3.77 - 5.28 x10E6/uL   Hemoglobin 12.2 11.1 - 15.9 g/dL   Hematocrit 61.8 65.9 - 46.6 %   MCV 91 79 - 97 fL   MCH 29.3 26.6 - 33.0 pg   MCHC 32.0 31.5 - 35.7 g/dL   RDW 86.9 88.2 - 84.5 %   Platelets 347 150 - 450 x10E3/uL   Neutrophils 48 Not Estab. %   Lymphs 36 Not Estab. %   Monocytes 12 Not Estab. %   Eos 3 Not Estab. %   Basos 1 Not Estab. %   Neutrophils Absolute 3.8 1.4 - 7.0 x10E3/uL   Lymphocytes Absolute 2.9 0.7 - 3.1 x10E3/uL   Monocytes Absolute 1.0 (H) 0.1 - 0.9 x10E3/uL   EOS (ABSOLUTE) 0.2 0.0 - 0.4 x10E3/uL   Basophils Absolute 0.1 0.0 - 0.2 x10E3/uL   Immature Granulocytes 0 Not Estab. %   Immature Grans (Abs) 0.0 0.0 -  0.1 x10E3/uL  Comprehensive metabolic panel with GFR   Collection Time: 02/14/24  1:38 PM  Result Value Ref Range   Glucose 102 (H) 70 - 99 mg/dL   BUN 13 8 - 27 mg/dL   Creatinine, Ser 8.73 (H) 0.57 - 1.00 mg/dL   eGFR 46 (L) >40 fO/fpw/8.26   BUN/Creatinine Ratio 10 (L) 12 - 28   Sodium 140 134 - 144 mmol/L   Potassium 4.1 3.5 - 5.2 mmol/L   Chloride 104 96 - 106 mmol/L   CO2 23 20 - 29 mmol/L   Calcium  9.0 8.7 - 10.3 mg/dL   Total Protein 6.3 6.0 - 8.5 g/dL   Albumin 4.1 3.8 - 4.8 g/dL   Globulin, Total 2.2 1.5 - 4.5 g/dL   Bilirubin Total 0.2 0.0 - 1.2 mg/dL   Alkaline Phosphatase 95 49 - 135 IU/L   AST 26 0 - 40 IU/L   ALT 16 0 - 32 IU/L  Lipid Panel w/o Chol/HDL Ratio   Collection Time: 02/14/24  1:38 PM  Result Value Ref Range   Cholesterol, Total 150 100 - 199 mg/dL   Triglycerides 86 0 - 149 mg/dL   HDL 69 >60 mg/dL   VLDL  Cholesterol Cal 16 5 - 40 mg/dL   LDL Chol Calc (NIH) 65 0 - 99 mg/dL      Assessment & Plan:   Problem List Items Addressed This Visit   None    Follow up plan: No follow-ups on file.      "

## 2024-03-16 DIAGNOSIS — J449 Chronic obstructive pulmonary disease, unspecified: Secondary | ICD-10-CM | POA: Diagnosis not present

## 2024-03-18 ENCOUNTER — Telehealth: Payer: Self-pay

## 2024-03-18 ENCOUNTER — Other Ambulatory Visit: Payer: Self-pay

## 2024-03-18 MED ORDER — BREZTRI AEROSPHERE 160-9-4.8 MCG/ACT IN AERO: 2.0000 | INHALATION_SPRAY | Freq: Two times a day (BID) | RESPIRATORY_TRACT | 4 refills | Status: AC

## 2024-03-18 NOTE — Telephone Encounter (Signed)
 Gave [employer](Breztri ) a call,pt has not received an inhaler, spoke with [employer] representative explain pt has not received one since 08/31/2023, pt needs a new prescription can be faxed to [facility] at [phone] is enrolled in 2026 per representative.SABRA

## 2024-03-18 NOTE — Progress Notes (Signed)
" ° °  03/18/2024  Patient ID: Kristen Mills, female   DOB: 29-Aug-1950, 73 y.o.   MRN: [id number]  Per [employer] PAP, new Breztri  prescription needs to be sent to [facility]- order pending for PCP to sign.  I will follow-up with the pharmacy Wednesday to verify this is processing and check on estimated delivery.  Channing DELENA Mealing, PharmD, DPLA  "

## 2024-03-19 ENCOUNTER — Telehealth: Payer: Self-pay

## 2024-03-19 NOTE — Progress Notes (Signed)
" ° °  03/19/2024  Patient ID: Kristen Mills, female   DOB: [birth year], 73 y.o.   MRN: [id number]  Patient reached out to [facility] to inform them she received a call from [employer] [facility] to let her know she has been approved through 03/27/2025 to continue to receive Breztri  at no cost.  They state medication should ship within 10-14 business days, and she was provided with 2 sample Breztri  inhalers (28 day supply) on 12/19; so she should have enough medication to last until delivery arrives.  Advised her to inform PCP if inhalers have not been received by her 1/5 follow-up visit.  Channing DELENA Mealing, PharmD, DPLA  "

## 2024-03-19 NOTE — Telephone Encounter (Unsigned)
 Copied from CRM #[id number]. Topic: Clinical - Medication Question >> Mar 19, 2024  3:46 PM Shanda MATSU wrote: Reason for CRM: Patient called in to adv that [employer] reached out to her today to adv her that they will be sending the prescription to PCP and to adv patient that she has been approved, patient is wanting to know if she still needs to speak with Channing tomorrow morning, is req a call back.

## 2024-03-20 ENCOUNTER — Other Ambulatory Visit

## 2024-03-20 ENCOUNTER — Telehealth: Payer: Self-pay

## 2024-03-20 NOTE — Progress Notes (Signed)
 Complex Care Management Note Care Guide Note  03/20/2024 Name: Kristen Mills MRN: [id number] DOB: 10-11-1950  Kristen Mills is a 73 y.o. year old female who is a primary care patient of Cannady, Jolene T, NP . The community resource team was consulted for assistance with Financial Difficulties related to utilities.  SDOH screenings and interventions completed:  Yes  Social Drivers of Health From This Encounter   Food Insecurity: No Food Insecurity (03/20/2024)   Epic    Worried About Programme Researcher, Broadcasting/film/video in the Last Year: Never true    Ran Out of Food in the Last Year: Never true  Housing: Low Risk (03/20/2024)   Epic    Unable to Pay for Housing in the Last Year: No    Number of Times Moved in the Last Year: 0    Homeless in the Last Year: No  Financial Resource Strain: Medium Risk (03/20/2024)   Overall Financial Resource Strain (CARDIA)    Difficulty of Paying Living Expenses: Somewhat hard  Transportation Needs: No Transportation Needs (03/20/2024)   Epic    Lack of Transportation (Medical): No    Lack of Transportation (Non-Medical): No  Utilities: At Risk (03/20/2024)   Epic    Threatened with loss of utilities: Yes    SDOH Interventions Today    Flowsheet Row Most Recent Value  SDOH Interventions   Utilities Interventions Other (Comment)  [Patient stated she has worked out a insurance claims handler with [employer]. Patient has contact information for [employer] and plans on contacting them. Mailing information for [employer]. [employer] is currently out of funds.]     Care guide performed the following interventions: Patient provided with information about care guide support team and interviewed to confirm resource needs.  Follow Up Plan:  No further follow up planned at this time. The patient has been provided with needed resources.  Encounter Outcome:  Patient Visit Completed  Kristen Mills [name]  [facility] Guide Direct Dial: [phone]  Fax: [phone] Website: [email or web address]

## 2024-03-27 ENCOUNTER — Other Ambulatory Visit: Payer: Self-pay

## 2024-03-27 NOTE — Patient Instructions (Signed)
 Visit Information  Thank you for taking time to visit with me today. Please don't hesitate to contact me if I can be of assistance to you before our next scheduled appointment.  Our next appointment is by telephone on 04/24/24 at 1:00pm Please call the care guide team at [phone] if you need to cancel or reschedule your appointment.   Following is a copy of your care plan:   Goals Addressed             This Visit's Progress    VBCI RN Care Plan       Problems:  Care Coordination needs related to Food Insecurity  and Transportation Chronic Disease Management support and education needs related to DMII and HTN Difficulty obtaining medications Transportation barriers  Goal: Over the next 90 days the Patient will attend all scheduled medical appointments: PCP as evidenced by medical record        continue to work with Medical Illustrator and/or Social Worker to address care management and care coordination needs related to HTN, DM as evidenced by adherence to care management team scheduled appointments     demonstrate a decrease DMII and HTN in exacerbations as evidenced by medical record demonstrate understanding of rationale for each prescribed medication as evidenced by verbal acknowledgement     Interventions:   Diabetes Interventions: Assessed patient's understanding of A1c goal: <7% Provided education to patient about basic DM disease process Reviewed medications with patient and discussed importance of medication adherence Counseled on importance of regular laboratory monitoring as prescribed Discussed plans with patient for ongoing care management follow up and provided patient with direct contact information for care management team Provided patient with written educational materials related to hypo and hyperglycemia and importance of correct treatment Reviewed scheduled/upcoming provider appointments including: pcp 04/01/24 Assessed social determinant of health barriers Lab  Results  Component Value Date   HGBA1C 5.6 02/14/2024    Hypertension Interventions: Last practice recorded BP readings:  BP Readings from Last 3 Encounters:  03/15/24 122/68  02/14/24 128/78  01/14/24 (!) 150/98   Most recent eGFR/CrCl:  Lab Results  Component Value Date   EGFR 46 (L) 02/14/2024    No components found for: CRCL  Evaluation of current treatment plan related to hypertension self management and patient's adherence to plan as established by provider Reviewed medications with patient and discussed importance of compliance Counseled on the importance of exercise goals with target of 150 minutes per week Discussed plans with patient for ongoing care management follow up and provided patient with direct contact information for care management team Assessed social determinant of health barriers   Evaluation of current treatment plan related to HTN, DM and patient's adherence to plan as established by provider.  Patient Self-Care Activities:  Attend all scheduled provider appointments Call pharmacy for medication refills 3-7 days in advance of running out of medications Call provider office for new concerns or questions  Perform all self care activities independently  Take medications as prescribed    Plan:  Next PCP appointment scheduled for: 04/01/24             Please call the Suicide and Crisis Lifeline: 988 call the [place]  National Suicide Prevention Lifeline: 9092082747 or TTY: (321)507-6072 TTY 804 426 0062) to talk to a trained counselor call 1-800-273-TALK (toll free, 24 hour hotline) if you are experiencing a Mental Health or Behavioral Health Crisis or need someone to talk to.  Patient verbalized understanding of Care plan and visit instructions communicated this  visit  Shann Lewellyn  Administrator, Sports [employer] [phone]

## 2024-03-27 NOTE — Patient Outreach (Signed)
 Complex Care Management   Visit Note  03/27/2024  Name:  Kristen Mills MRN: [id number] DOB: Feb 10, 1951  Situation: Referral received for Complex Care Management related to HTN, DM I obtained verbal consent from Patient.  Visit completed with Patient  on the phone  Background:   Past Medical History:  Diagnosis Date   Asthma    Cancer (HCC)    Uterine Cancer   COPD (chronic obstructive pulmonary disease) (HCC)    Depression    Diabetes mellitus without complication (HCC)    Hyperlipidemia    Hypertension    Insomnia    Polysubstance abuse (HCC)    Spinal stenosis    Thyroid  condition     Assessment: Patient Reported Symptoms:  Cognitive Cognitive Status: Alert and oriented to person, place, and time, No symptoms reported   Health Maintenance Behaviors: Annual physical exam Healing Pattern: Slow  Neurological Neurological Review of Symptoms: No symptoms reported Neurological Management Strategies: Routine screening Neurological Self-Management Outcome: 3 (uncertain)  HEENT HEENT Symptoms Reported: Change or loss of hearing HEENT Management Strategies: Routine screening HEENT Self-Management Outcome: 4 (good)    Cardiovascular Cardiovascular Symptoms Reported: No symptoms reported Does patient have uncontrolled Hypertension?: No Cardiovascular Management Strategies: Routine screening Cardiovascular Self-Management Outcome: 4 (good)  Respiratory Other Respiratory Symptoms: wears oxygen Respiratory Management Strategies: Routine screening Respiratory Self-Management Outcome: 4 (good)  Endocrine Endocrine Symptoms Reported: No symptoms reported Is patient diabetic?: Yes Is patient checking blood sugars at home?: No Endocrine Self-Management Outcome: 3 (uncertain)  Gastrointestinal Gastrointestinal Symptoms Reported: No symptoms reported Gastrointestinal Self-Management Outcome: 4 (good)    Genitourinary Genitourinary Symptoms Reported: No symptoms  reported Genitourinary Self-Management Outcome: 4 (good)  Integumentary Integumentary Symptoms Reported: Bruising Additional Integumentary Details: bruises easy Skin Management Strategies: Routine screening Skin Self-Management Outcome: 4 (good)  Musculoskeletal Musculoskelatal Symptoms Reviewed: No symptoms reported Musculoskeletal Management Strategies: Routine screening Musculoskeletal Self-Management Outcome: 4 (good) Falls in the past year?: No Number of falls in past year: 1 or less Was there an injury with Fall?: No Fall Risk Category Calculator: 0 Patient Fall Risk Level: Low Fall Risk Fall risk Follow up: Education provided, Falls prevention discussed, Falls evaluation completed  Psychosocial Psychosocial Symptoms Reported: No symptoms reported Behavioral Health Self-Management Outcome: 4 (good) Major Change/Loss/Stressor/Fears (CP): Denies Quality of Family Relationships: unable to assess Do you feel physically threatened by others?: No    03/27/2024    PHQ2-9 Depression Screening   Little interest or pleasure in doing things    Feeling down, depressed, or hopeless    PHQ-2 - Total Score    Trouble falling or staying asleep, or sleeping too much    Feeling tired or having little energy    Poor appetite or overeating     Feeling bad about yourself - or that you are a failure or have let yourself or your family down    Trouble concentrating on things, such as reading the newspaper or watching television    Moving or speaking so slowly that other people could have noticed.  Or the opposite - being so fidgety or restless that you have been moving around a lot more than usual    Thoughts that you would be better off dead, or hurting yourself in some way    PHQ2-9 Total Score    If you checked off any problems, how difficult have these problems made it for you to do your work, take care of things at home, or get along with other people  Depression Interventions/Treatment       There were no vitals filed for this visit. Pain Scale: 0-10 Pain Score: 0-No pain  Medications Reviewed Today     Reviewed by Nivia, Elveria Lauderbaugh , RN (Registered Nurse) on 03/27/24 at 1324  Med List Status: <None>   Medication Order Taking? Sig Documenting Provider Last Dose Status Informant  albuterol  (VENTOLIN  HFA) 108 (90 Base) MCG/ACT inhaler [id number] Yes INHALE 2 PUFFS EVERY 4 TO 6 HOURS AS NEEDED FOR SHORTNESS OF BREATH Cannady, Jolene T, NP  Active Pharmacy Records  atorvastatin  (LIPITOR) 20 MG tablet [id number] Yes Take 1 tablet (20 mg total) by mouth daily. Cannady, Jolene T, NP  Active Pharmacy Records  budesonide -glycopyrrolate -formoterol  (BREZTRI  AEROSPHERE) 160-9-4.8 MCG/ACT AERO inhaler [id number] Yes Inhale 2 puffs into the lungs in the morning and at bedtime. Cannady, Jolene T, NP  Active   buPROPion  (WELLBUTRIN  XL) 300 MG 24 hr tablet [id number] Yes Take 1 tablet (300 mg total) by mouth daily. Cannady, Jolene T, NP  Active Pharmacy Records  Cholecalciferol  1.25 MG (50000 UT) capsule [id number] Yes Take 1 capsule (50,000 Units total) by mouth once a week. Cannady, Jolene T, NP  Active Pharmacy Records  gabapentin  (NEURONTIN ) 300 MG capsule [id number] Yes Take 1 capsule (300 mg total) by mouth 3 (three) times daily. Cannady, Jolene T, NP  Active Pharmacy Records  hydrOXYzine  (ATARAX ) 10 MG tablet [id number] Yes Take 1 tablet (10 mg total) by mouth 2 (two) times daily as needed. Cannady, Jolene T, NP  Active Pharmacy Records  levothyroxine  (SYNTHROID ) 50 MCG tablet [id number] Yes Take 1 tablet (50 mcg total) by mouth daily. Cannady, Jolene T, NP  Active Pharmacy Records  nicotine  (NICODERM CQ  - DOSED IN MG/24 HOURS) 21 mg/24hr patch [id number] Yes Place 1 patch (21 mg total) onto the skin daily. Amin, Sumayya, MD  Active   telmisartan  (MICARDIS ) 20 MG tablet [id number] Yes Take 1 tablet (20 mg total) by mouth daily. Cannady, Jolene T, NP  Active Pharmacy Records  traZODone  (DESYREL ) 50 MG  tablet [id number] Yes Take 1 tablet (50 mg total) by mouth at bedtime. Cannady, Jolene T, NP  Active Pharmacy Records            Recommendation:   Continue Current Plan of Care  Follow Up Plan:   Telephone follow-up in 2 weeks  Kerianna Rawlinson RN RN Care Manager [facility] [phone]

## 2024-03-30 NOTE — Patient Instructions (Signed)
 Eating Plan for Chronic Obstructive Pulmonary Disease Chronic obstructive pulmonary disease (COPD) causes symptoms such as shortness of breath, coughing, and chest discomfort. These symptoms can make it difficult to eat enough to maintain a healthy weight. Generally, people with COPD should eat a diet that is high in calories, protein, and other nutrients to maintain body weight and to keep the lungs as healthy as possible. Depending on the medicines you take and other health conditions you may have, your health care provider may give you additional recommendations on what to eat or avoid. Talk with your health care provider about your goals for body weight, and work with a dietitian to develop an eating plan that is right for you. What are tips for following this plan? Reading food labels  Avoid foods with more than 300 milligrams (mg) of salt (sodium) per serving. Choose foods that contain at least 4 grams (g) of fiber per serving. Try to eat 20-30 g of fiber each day. Choose foods that are high in calories and protein, such as nuts, beans, yogurt, and cheese. Shopping Do not buy foods labeled as diet, low-calorie, or low-fat. If you are able to eat dairy products: Avoid low-fat or skim milk. Buy dairy products that have at least 2% fat. Buy nutritional supplement drinks. Buy grains and prepared foods labeled as enriched or fortified. Consider buying low-sodium, pre-made foods to conserve energy for eating. Cooking Add dry milk or protein powder to smoothies. Cook with healthy fats, such as olive oil, canola oil, sunflower oil, and grapeseed oil. Add oil, butter, cream cheese, or nut butters to foods to increase fat and calories. To make foods easier to chew and swallow: Cook vegetables, pasta, and rice until soft. Cut or grind meat into very small pieces. Dip breads in liquid. Meal planning  Eat when you feel hungry. Eat 5-6 small meals throughout the day. Drink 6-8 glasses of water  each day. Do not drink liquids with meals. Drink liquids at the end of the meal to avoid feeling full too quickly. Eat a variety of fruits and vegetables every day. Ask for assistance from family or friends with planning and preparing meals as needed. Avoid foods that cause you to feel bloated, such as carbonated drinks, fried foods, beans, broccoli, cabbage, and apples. For older adults, ask your local agency on aging whether you are eligible for meal assistance programs, such as Meals on Wheels. Lifestyle  Do not smoke. Eat slowly. Take small bites and chew food well before swallowing. Do not overeat. This may make it more difficult to breathe after eating. Sit up while eating. If needed, continue to use supplemental oxygen while eating. Rest or relax for 30 minutes before and after eating. Monitor your weight as told by your health care provider. Exercise as told by your health care provider. What foods should I eat? Fruits All fresh, dried, canned, or frozen fruits that do not cause gas. Vegetables All fresh, canned (no salt added), or frozen vegetables that do not cause gas. Grains Whole-grain bread. Enriched whole-grain pasta. Fortified whole-grain cereals. Fortified rice. Quinoa. Meats and other proteins Lean meat. Poultry. Fish. Dried beans. Unsalted nuts. Tofu. Eggs. Nut butters. Dairy Whole or 2% milk. Cheese. Yogurt. Fats and oils Olive oil. Canola oil. Butter. Margarine. Beverages Water. Vegetable juice (no salt added). Decaffeinated coffee. Decaffeinated or herbal tea. Seasonings and condiments Fresh or dried herbs. Low-salt or salt-free seasonings. Low-sodium soy sauce. The items listed above may not be a complete list of foods  and beverages you can eat. Contact a dietitian for more information. What foods should I avoid? Fruits Fruits that cause gas, such as apples or melon. Vegetables Vegetables that cause gas, such as broccoli, Brussels sprouts, cabbage,  cauliflower, and onions. Canned vegetables with added salt. Meats and other proteins Fried meat. Salt-cured meat. Processed meat. Dairy Fat-free or low-fat milk, yogurt, or cheese. Processed cheese. Beverages Carbonated drinks. Caffeinated drinks, such as coffee, tea, and soft drinks. Juice. Alcohol. Vegetable juice with added salt. Seasonings and condiments Salt. Seasoning mixes with salt. Soy sauce. Rosita Fire. Other foods Clear soup or broth. Fried foods. Prepared frozen meals. The items listed above may not be a complete list of foods and beverages you should avoid. Contact a dietitian for more information. Summary COPD symptoms can make it difficult to eat enough to maintain a healthy weight. A COPD eating plan can help you maintain your body weight and keep your lungs as healthy as possible. Eat a diet that is high in calories, protein, and other nutrients. Read labels to make sure that you are getting the right nutrients. Cook foods to make them easier to chew and swallow. Eat 5-6 small meals throughout the day, and avoid foods that cause gas or make you feel bloated. This information is not intended to replace advice given to you by your health care provider. Make sure you discuss any questions you have with your health care provider. Document Revised: 01/20/2023 Document Reviewed: 01/20/2023 Elsevier Patient Education  2024 [employer].

## 2024-04-01 ENCOUNTER — Ambulatory Visit (INDEPENDENT_AMBULATORY_CARE_PROVIDER_SITE_OTHER): Admitting: Nurse Practitioner

## 2024-04-01 ENCOUNTER — Encounter: Payer: Self-pay | Admitting: Nurse Practitioner

## 2024-04-01 VITALS — BP 105/75 | HR 99 | Temp 97.5°F | Resp 18 | Ht 60.0 in | Wt 134.0 lb

## 2024-04-01 DIAGNOSIS — F17219 Nicotine dependence, cigarettes, with unspecified nicotine-induced disorders: Secondary | ICD-10-CM

## 2024-04-01 DIAGNOSIS — J441 Chronic obstructive pulmonary disease with (acute) exacerbation: Secondary | ICD-10-CM

## 2024-04-01 NOTE — Assessment & Plan Note (Signed)
 Acute and improved at this time. Will continue current inhaler regimen as ordered, Breztri  via assistance. Recommend a visit with pulmonary, she refuses. Recommend complete cessation of smoking. Sample of Breztri  provided.

## 2024-04-01 NOTE — Progress Notes (Signed)
 "  BP 105/75 (BP Location: Left Arm, Patient Position: Sitting, Cuff Size: Normal)   Pulse 99   Temp (!) 97.5 F (36.4 C) (Oral)   Resp 18   Ht 5' (1.524 m)   Wt 134 lb (60.8 kg)   SpO2 95%   BMI 26.17 kg/m    Subjective:    Patient ID: Kristen Mills, female    DOB: [birth year], 74 y.o.   MRN: [id number]  HPI: Kristen Mills is a 74 y.o. female  Chief Complaint  Patient presents with   Follow-up    Here for follow up appointment. Has questions about breztri     COPD Follow-up today for lung check due to recent PNA. Using Breztri  via assistance program. Albuterol  as needed. Needs sample today to hold her over until Breztri  delivery. Smokes about 1/2 PPD. COPD status: improved Satisfied with current treatment?: yes Oxygen use: no Dyspnea frequency: no Cough frequency: improved Rescue inhaler frequency:  occasional Limitation of activity: no Productive cough: no Last Spirometry: not recent Pneumovax: Up to Date Influenza: Up to Date   Relevant past medical, surgical, family and social history reviewed and updated as indicated. Interim medical history since our last visit reviewed. Allergies and medications reviewed and updated.  Review of Systems  Constitutional:  Negative for activity change, appetite change, diaphoresis, fatigue and fever.  Respiratory:  Negative for cough, chest tightness, shortness of breath and wheezing.   Cardiovascular:  Negative for chest pain, palpitations and leg swelling.  Gastrointestinal: Negative.   Neurological: Negative.   Psychiatric/Behavioral: Negative.      Per HPI unless specifically indicated above     Objective:    BP 105/75 (BP Location: Left Arm, Patient Position: Sitting, Cuff Size: Normal)   Pulse 99   Temp (!) 97.5 F (36.4 C) (Oral)   Resp 18   Ht 5' (1.524 m)   Wt 134 lb (60.8 kg)   SpO2 95%   BMI 26.17 kg/m   Wt Readings from Last 3 Encounters:  04/01/24 134 lb (60.8 kg)  03/15/24 133 lb (60.3 kg)  02/14/24  121 lb (54.9 kg)    Physical Exam Vitals and nursing note reviewed.  Constitutional:      General: She is awake. She is not in acute distress.    Appearance: She is well-developed and well-groomed. She is not ill-appearing or toxic-appearing.  HENT:     Head: Normocephalic.     Right Ear: Hearing and external ear normal.     Left Ear: Hearing and external ear normal.  Eyes:     General: Lids are normal.        Right eye: No discharge.        Left eye: No discharge.     Conjunctiva/sclera: Conjunctivae normal.     Pupils: Pupils are equal, round, and reactive to light.  Neck:     Thyroid : No thyromegaly.     Vascular: No carotid bruit.  Cardiovascular:     Rate and Rhythm: Normal rate and regular rhythm.     Heart sounds: Normal heart sounds. No murmur heard.    No gallop.  Pulmonary:     Effort: Pulmonary effort is normal. No accessory muscle usage or respiratory distress.     Breath sounds: Normal breath sounds. No decreased breath sounds, wheezing or rales.     Comments: Lung sounds improved throughout today. Abdominal:     General: Bowel sounds are normal. There is no distension.     Palpations: Abdomen  is soft.     Tenderness: There is no abdominal tenderness.  Musculoskeletal:     Cervical back: Normal range of motion and neck supple.     Right lower leg: No edema.     Left lower leg: No edema.  Lymphadenopathy:     Cervical: No cervical adenopathy.  Skin:    General: Skin is warm and dry.  Neurological:     Mental Status: She is alert and oriented to person, place, and time.     Deep Tendon Reflexes: Reflexes are normal and symmetric.     Reflex Scores:      Brachioradialis reflexes are 2+ on the right side and 2+ on the left side.      Patellar reflexes are 2+ on the right side and 2+ on the left side. Psychiatric:        Attention and Perception: Attention normal.        Mood and Affect: Mood normal.        Speech: Speech normal.        Behavior: Behavior  normal. Behavior is cooperative.        Thought Content: Thought content normal.     Results for orders placed or performed in visit on 02/14/24  Bayer DCA Hb A1c Waived   Collection Time: 02/14/24  1:37 PM  Result Value Ref Range   HB A1C (BAYER DCA - WAIVED) 5.6 4.8 - 5.6 %  Ferritin   Collection Time: 02/14/24  1:38 PM  Result Value Ref Range   Ferritin 98 15 - 150 ng/mL  Iron   Collection Time: 02/14/24  1:38 PM  Result Value Ref Range   Iron 59 27 - 139 ug/dL  CBC with Differential/Platelet   Collection Time: 02/14/24  1:38 PM  Result Value Ref Range   WBC 8.0 3.4 - 10.8 x10E3/uL   RBC 4.17 3.77 - 5.28 x10E6/uL   Hemoglobin 12.2 11.1 - 15.9 g/dL   Hematocrit 61.8 65.9 - 46.6 %   MCV 91 79 - 97 fL   MCH 29.3 26.6 - 33.0 pg   MCHC 32.0 31.5 - 35.7 g/dL   RDW 86.9 88.2 - 84.5 %   Platelets 347 150 - 450 x10E3/uL   Neutrophils 48 Not Estab. %   Lymphs 36 Not Estab. %   Monocytes 12 Not Estab. %   Eos 3 Not Estab. %   Basos 1 Not Estab. %   Neutrophils Absolute 3.8 1.4 - 7.0 x10E3/uL   Lymphocytes Absolute 2.9 0.7 - 3.1 x10E3/uL   Monocytes Absolute 1.0 (H) 0.1 - 0.9 x10E3/uL   EOS (ABSOLUTE) 0.2 0.0 - 0.4 x10E3/uL   Basophils Absolute 0.1 0.0 - 0.2 x10E3/uL   Immature Granulocytes 0 Not Estab. %   Immature Grans (Abs) 0.0 0.0 - 0.1 x10E3/uL  Comprehensive metabolic panel with GFR   Collection Time: 02/14/24  1:38 PM  Result Value Ref Range   Glucose 102 (H) 70 - 99 mg/dL   BUN 13 8 - 27 mg/dL   Creatinine, Ser 8.73 (H) 0.57 - 1.00 mg/dL   eGFR 46 (L) >40 fO/fpw/8.26   BUN/Creatinine Ratio 10 (L) 12 - 28   Sodium 140 134 - 144 mmol/L   Potassium 4.1 3.5 - 5.2 mmol/L   Chloride 104 96 - 106 mmol/L   CO2 23 20 - 29 mmol/L   Calcium  9.0 8.7 - 10.3 mg/dL   Total Protein 6.3 6.0 - 8.5 g/dL   Albumin 4.1 3.8 - 4.8 g/dL  Globulin, Total 2.2 1.5 - 4.5 g/dL   Bilirubin Total 0.2 0.0 - 1.2 mg/dL   Alkaline Phosphatase 95 49 - 135 IU/L   AST 26 0 - 40 IU/L   ALT 16  0 - 32 IU/L  Lipid Panel w/o Chol/HDL Ratio   Collection Time: 02/14/24  1:38 PM  Result Value Ref Range   Cholesterol, Total 150 100 - 199 mg/dL   Triglycerides 86 0 - 149 mg/dL   HDL 69 >60 mg/dL   VLDL Cholesterol Cal 16 5 - 40 mg/dL   LDL Chol Calc (NIH) 65 0 - 99 mg/dL      Assessment & Plan:   Problem List Items Addressed This Visit       Respiratory   COPD exacerbation (HCC) - Primary   Acute and improved at this time. Will continue current inhaler regimen as ordered, Breztri  via assistance. Recommend a visit with pulmonary, she refuses. Recommend complete cessation of smoking. Sample of Breztri  provided.        Nervous and Auditory   Nicotine  dependence, cigarettes, w unsp disorders   I have recommended complete cessation of tobacco use. She continues on Wellbutrin , which has offered some benefit.  Refuses CT lung screening.         Follow up plan: Return in about 4 months (around 07/30/2024) for COPD, HTN/HLD.      "

## 2024-04-01 NOTE — Assessment & Plan Note (Signed)
 I have recommended complete cessation of tobacco use. She continues on Wellbutrin , which has offered some benefit.  Refuses CT lung screening.

## 2024-04-24 ENCOUNTER — Telehealth

## 2024-04-24 NOTE — Patient Instructions (Signed)
 Visit Information  Thank you for taking time to visit with me today. Please don't hesitate to contact me if I can be of assistance to you before our next scheduled appointment.  Your next care management appointment is by telephone on 05/21/23 at 1:00  Telephone follow-up in 1 month  Please call the care guide team at [phone] if you need to cancel, schedule, or reschedule an appointment.   Please call the Suicide and Crisis Lifeline: 988 call the [place]  National Suicide Prevention Lifeline: 825-737-5630 or TTY: 205-372-6489 TTY (430)014-3714) to talk to a trained counselor call 1-800-273-TALK (toll free, 24 hour hotline) if you are experiencing a Mental Health or Behavioral Health Crisis or need someone to talk to. Georgian Nicks RN RN Care Manager [facility] [phone]

## 2024-04-24 NOTE — Patient Outreach (Signed)
 Complex Care Management   Visit Note  04/24/2024  Name:  Kristen Mills MRN: [id number] DOB: 1950/09/28  Situation: Referral received for Complex Care Management related to COPD I obtained verbal consent from Patient.  Visit completed with Patient  on the phone  Background:   Past Medical History:  Diagnosis Date   Asthma    Cancer (HCC)    Uterine Cancer   COPD (chronic obstructive pulmonary disease) (HCC)    Depression    Diabetes mellitus without complication (HCC)    Hyperlipidemia    Hypertension    Insomnia    Polysubstance abuse (HCC)    Spinal stenosis    Thyroid  condition     Assessment: Patient Reported Symptoms:  Cognitive        Neurological      HEENT        Cardiovascular      Respiratory      Endocrine Endocrine Symptoms Reported: No symptoms reported Is patient diabetic?: Yes Is patient checking blood sugars at home?: No Endocrine Self-Management Outcome: 3 (uncertain)  Gastrointestinal Gastrointestinal Symptoms Reported: No symptoms reported Gastrointestinal Self-Management Outcome: 4 (good)    Genitourinary Genitourinary Symptoms Reported: No symptoms reported Genitourinary Self-Management Outcome: 4 (good)  Integumentary Integumentary Symptoms Reported: Bruising Skin Management Strategies: Routine screening Skin Self-Management Outcome: 4 (good)  Musculoskeletal Musculoskelatal Symptoms Reviewed: No symptoms reported Musculoskeletal Management Strategies: Routine screening Musculoskeletal Self-Management Outcome: 4 (good) Falls in the past year?: No Number of falls in past year: 1 or less Was there an injury with Fall?: No Fall Risk Category Calculator: 0 Patient Fall Risk Level: Low Fall Risk    Psychosocial Psychosocial Symptoms Reported: No symptoms reported Behavioral Health Self-Management Outcome: 4 (good) Major Change/Loss/Stressor/Fears (CP): Denies      04/24/2024    PHQ2-9 Depression Screening   Little interest or  pleasure in doing things Not at all  Feeling down, depressed, or hopeless Not at all  PHQ-2 - Total Score 0  Trouble falling or staying asleep, or sleeping too much    Feeling tired or having little energy    Poor appetite or overeating     Feeling bad about yourself - or that you are a failure or have let yourself or your family down    Trouble concentrating on things, such as reading the newspaper or watching television    Moving or speaking so slowly that other people could have noticed.  Or the opposite - being so fidgety or restless that you have been moving around a lot more than usual    Thoughts that you would be better off dead, or hurting yourself in some way    PHQ2-9 Total Score    If you checked off any problems, how difficult have these problems made it for you to do your work, take care of things at home, or get along with other people    Depression Interventions/Treatment      There were no vitals filed for this visit.    Medications Reviewed Today     Reviewed by Nivia, Alara Daniel , RN (Registered Nurse) on 04/24/24 at 1308  Med List Status: <None>   Medication Order Taking? Sig Documenting Provider Last Dose Status Informant  albuterol  (VENTOLIN  HFA) 108 (90 Base) MCG/ACT inhaler [id number] Yes INHALE 2 PUFFS EVERY 4 TO 6 HOURS AS NEEDED FOR SHORTNESS OF BREATH Cannady, Jolene T, NP  Active Pharmacy Records  atorvastatin  (LIPITOR) 20 MG tablet [id number] Yes Take 1 tablet (20 mg total) by mouth  daily. Cannady, Jolene T, NP  Active Pharmacy Records  budesonide -glycopyrrolate -formoterol  (BREZTRI  AEROSPHERE) 160-9-4.8 MCG/ACT AERO inhaler [id number] Yes Inhale 2 puffs into the lungs in the morning and at bedtime. Cannady, Jolene T, NP  Active   buPROPion  (WELLBUTRIN  XL) 300 MG 24 hr tablet [id number] Yes Take 1 tablet (300 mg total) by mouth daily. Cannady, Jolene T, NP  Active Pharmacy Records  Cholecalciferol  1.25 MG (50000 UT) capsule [id number] Yes Take 1 capsule (50,000 Units  total) by mouth once a week. Cannady, Jolene T, NP  Active Pharmacy Records  gabapentin  (NEURONTIN ) 300 MG capsule [id number] Yes Take 1 capsule (300 mg total) by mouth 3 (three) times daily. Cannady, Jolene T, NP  Active Pharmacy Records  hydrOXYzine  (ATARAX ) 10 MG tablet [id number] Yes Take 1 tablet (10 mg total) by mouth 2 (two) times daily as needed. Cannady, Jolene T, NP  Active Pharmacy Records  levothyroxine  (SYNTHROID ) 50 MCG tablet [id number] Yes Take 1 tablet (50 mcg total) by mouth daily. Cannady, Jolene T, NP  Active Pharmacy Records  nicotine  (NICODERM CQ  - DOSED IN MG/24 HOURS) 21 mg/24hr patch [id number] Yes Place 1 patch (21 mg total) onto the skin daily. Amin, Sumayya, MD  Active   telmisartan  (MICARDIS ) 20 MG tablet [id number] Yes Take 1 tablet (20 mg total) by mouth daily. Cannady, Jolene T, NP  Active Pharmacy Records  traZODone  (DESYREL ) 50 MG tablet [id number] Yes Take 1 tablet (50 mg total) by mouth at bedtime. Cannady, Jolene T, NP  Active Pharmacy Records            Recommendation:   Continue Current Plan of Care  Follow Up Plan:   Telephone follow-up in 1 month  Keysi Oelkers RN RN Care Manager [facility] [phone]

## 2024-05-09 ENCOUNTER — Ambulatory Visit

## 2024-07-30 ENCOUNTER — Ambulatory Visit: Admitting: Nurse Practitioner
